# Patient Record
Sex: Female | Born: 1945
Health system: Southern US, Community
[De-identification: ages and names within clinical notes are randomized; demographics above are authoritative.]

## PROBLEM LIST (undated history)

## (undated) DIAGNOSIS — K838 Other specified diseases of biliary tract: Secondary | ICD-10-CM

## (undated) DIAGNOSIS — M7062 Trochanteric bursitis, left hip: Secondary | ICD-10-CM

## (undated) DIAGNOSIS — F341 Dysthymic disorder: Secondary | ICD-10-CM

## (undated) DIAGNOSIS — M67919 Unspecified disorder of synovium and tendon, unspecified shoulder: Secondary | ICD-10-CM

## (undated) DIAGNOSIS — E559 Vitamin D deficiency, unspecified: Secondary | ICD-10-CM

## (undated) DIAGNOSIS — K219 Gastro-esophageal reflux disease without esophagitis: Secondary | ICD-10-CM

## (undated) DIAGNOSIS — D649 Anemia, unspecified: Secondary | ICD-10-CM

## (undated) DIAGNOSIS — G4700 Insomnia, unspecified: Secondary | ICD-10-CM

## (undated) DIAGNOSIS — I8390 Asymptomatic varicose veins of unspecified lower extremity: Secondary | ICD-10-CM

## (undated) DIAGNOSIS — E785 Hyperlipidemia, unspecified: Secondary | ICD-10-CM

## (undated) DIAGNOSIS — I10 Essential (primary) hypertension: Secondary | ICD-10-CM

## (undated) DIAGNOSIS — M719 Bursopathy, unspecified: Secondary | ICD-10-CM

## (undated) DIAGNOSIS — I2510 Atherosclerotic heart disease of native coronary artery without angina pectoris: Secondary | ICD-10-CM

## (undated) DIAGNOSIS — E039 Hypothyroidism, unspecified: Secondary | ICD-10-CM

## (undated) HISTORY — DX: Gastro-esophageal reflux disease without esophagitis: K21.9

## (undated) HISTORY — DX: Asymptomatic varicose veins of unspecified lower extremity: I83.90

## (undated) HISTORY — DX: Vitamin D deficiency, unspecified: E55.9

## (undated) HISTORY — DX: Atherosclerotic heart disease of native coronary artery without angina pectoris: I25.10

## (undated) HISTORY — DX: Other specified diseases of biliary tract: K83.8

## (undated) HISTORY — DX: Bursopathy, unspecified: M71.9

## (undated) HISTORY — DX: Essential (primary) hypertension: I10

## (undated) HISTORY — DX: Trochanteric bursitis, left hip: M70.62

## (undated) HISTORY — DX: Insomnia, unspecified: G47.00

## (undated) HISTORY — PX: OTHER SURGICAL HISTORY: SHX169

## (undated) HISTORY — DX: Unspecified disorder of synovium and tendon, unspecified shoulder: M67.919

## (undated) HISTORY — PX: CHOLECYSTECTOMY: SHX55

## (undated) HISTORY — DX: Anemia, unspecified: D64.9

## (undated) HISTORY — DX: Dysthymic disorder: F34.1

## (undated) HISTORY — DX: Hypothyroidism, unspecified: E03.9

## (undated) HISTORY — DX: Hyperlipidemia, unspecified: E78.5

---

## 1998-01-24 ENCOUNTER — Ambulatory Visit (HOSPITAL_COMMUNITY): Admission: RE | Admit: 1998-01-24 | Discharge: 1998-01-24 | Payer: Self-pay | Admitting: Obstetrics and Gynecology

## 1998-01-24 ENCOUNTER — Encounter: Payer: Self-pay | Admitting: Obstetrics and Gynecology

## 1998-03-14 ENCOUNTER — Ambulatory Visit (HOSPITAL_COMMUNITY): Admission: RE | Admit: 1998-03-14 | Discharge: 1998-03-14 | Payer: Self-pay | Admitting: Gastroenterology

## 1998-05-12 ENCOUNTER — Other Ambulatory Visit: Admission: RE | Admit: 1998-05-12 | Discharge: 1998-05-12 | Payer: Self-pay | Admitting: Obstetrics and Gynecology

## 1999-05-04 ENCOUNTER — Other Ambulatory Visit: Admission: RE | Admit: 1999-05-04 | Discharge: 1999-05-04 | Payer: Self-pay | Admitting: Obstetrics and Gynecology

## 2000-05-06 ENCOUNTER — Ambulatory Visit (HOSPITAL_COMMUNITY): Admission: RE | Admit: 2000-05-06 | Discharge: 2000-05-06 | Payer: Self-pay | Admitting: Gastroenterology

## 2000-05-06 ENCOUNTER — Encounter (INDEPENDENT_AMBULATORY_CARE_PROVIDER_SITE_OTHER): Payer: Self-pay | Admitting: Specialist

## 2000-05-23 ENCOUNTER — Other Ambulatory Visit: Admission: RE | Admit: 2000-05-23 | Discharge: 2000-05-23 | Payer: Self-pay | Admitting: Obstetrics and Gynecology

## 2001-06-19 ENCOUNTER — Other Ambulatory Visit: Admission: RE | Admit: 2001-06-19 | Discharge: 2001-06-19 | Payer: Self-pay | Admitting: Obstetrics and Gynecology

## 2002-04-25 ENCOUNTER — Encounter: Payer: Self-pay | Admitting: Emergency Medicine

## 2002-04-25 ENCOUNTER — Inpatient Hospital Stay (HOSPITAL_COMMUNITY): Admission: EM | Admit: 2002-04-25 | Discharge: 2002-04-26 | Payer: Self-pay | Admitting: Emergency Medicine

## 2002-09-17 ENCOUNTER — Other Ambulatory Visit: Admission: RE | Admit: 2002-09-17 | Discharge: 2002-09-17 | Payer: Self-pay | Admitting: Obstetrics and Gynecology

## 2003-02-27 ENCOUNTER — Emergency Department (HOSPITAL_COMMUNITY): Admission: EM | Admit: 2003-02-27 | Discharge: 2003-02-28 | Payer: Self-pay | Admitting: Emergency Medicine

## 2003-03-01 ENCOUNTER — Encounter (INDEPENDENT_AMBULATORY_CARE_PROVIDER_SITE_OTHER): Payer: Self-pay | Admitting: Specialist

## 2003-03-01 ENCOUNTER — Inpatient Hospital Stay (HOSPITAL_COMMUNITY): Admission: EM | Admit: 2003-03-01 | Discharge: 2003-03-04 | Payer: Self-pay | Admitting: Emergency Medicine

## 2003-09-02 ENCOUNTER — Other Ambulatory Visit: Admission: RE | Admit: 2003-09-02 | Discharge: 2003-09-02 | Payer: Self-pay | Admitting: Obstetrics and Gynecology

## 2003-10-08 ENCOUNTER — Ambulatory Visit (HOSPITAL_COMMUNITY): Admission: RE | Admit: 2003-10-08 | Discharge: 2003-10-08 | Payer: Self-pay | Admitting: Orthopedic Surgery

## 2003-12-11 ENCOUNTER — Ambulatory Visit: Payer: Self-pay | Admitting: Family Medicine

## 2004-08-05 ENCOUNTER — Encounter: Admission: RE | Admit: 2004-08-05 | Discharge: 2004-08-18 | Payer: Self-pay | Admitting: Nurse Practitioner

## 2004-09-10 ENCOUNTER — Ambulatory Visit: Payer: Self-pay | Admitting: Family Medicine

## 2004-10-05 ENCOUNTER — Other Ambulatory Visit: Admission: RE | Admit: 2004-10-05 | Discharge: 2004-10-05 | Payer: Self-pay | Admitting: Obstetrics and Gynecology

## 2004-10-23 ENCOUNTER — Ambulatory Visit (HOSPITAL_COMMUNITY): Admission: RE | Admit: 2004-10-23 | Discharge: 2004-10-23 | Payer: Self-pay | Admitting: Orthopedic Surgery

## 2004-11-24 ENCOUNTER — Ambulatory Visit: Payer: Self-pay | Admitting: Family Medicine

## 2005-06-15 ENCOUNTER — Ambulatory Visit: Payer: Self-pay | Admitting: Family Medicine

## 2005-06-21 ENCOUNTER — Ambulatory Visit: Payer: Self-pay | Admitting: Family Medicine

## 2005-06-25 ENCOUNTER — Ambulatory Visit: Payer: Self-pay | Admitting: Family Medicine

## 2005-07-06 ENCOUNTER — Ambulatory Visit: Payer: Self-pay | Admitting: Family Medicine

## 2005-07-15 ENCOUNTER — Ambulatory Visit: Payer: Self-pay | Admitting: Family Medicine

## 2005-07-23 ENCOUNTER — Ambulatory Visit: Payer: Self-pay | Admitting: Internal Medicine

## 2005-08-02 ENCOUNTER — Ambulatory Visit: Payer: Self-pay | Admitting: Family Medicine

## 2005-10-04 ENCOUNTER — Ambulatory Visit: Payer: Self-pay | Admitting: Internal Medicine

## 2005-11-08 ENCOUNTER — Ambulatory Visit (HOSPITAL_COMMUNITY): Admission: RE | Admit: 2005-11-08 | Discharge: 2005-11-08 | Payer: Self-pay | Admitting: Obstetrics and Gynecology

## 2005-11-24 ENCOUNTER — Encounter: Admission: RE | Admit: 2005-11-24 | Discharge: 2005-11-24 | Payer: Self-pay | Admitting: Obstetrics and Gynecology

## 2005-12-02 ENCOUNTER — Encounter (INDEPENDENT_AMBULATORY_CARE_PROVIDER_SITE_OTHER): Payer: Self-pay | Admitting: *Deleted

## 2005-12-02 ENCOUNTER — Encounter: Admission: RE | Admit: 2005-12-02 | Discharge: 2005-12-02 | Payer: Self-pay | Admitting: Obstetrics and Gynecology

## 2005-12-03 ENCOUNTER — Encounter (INDEPENDENT_AMBULATORY_CARE_PROVIDER_SITE_OTHER): Payer: Self-pay | Admitting: *Deleted

## 2005-12-03 ENCOUNTER — Ambulatory Visit (HOSPITAL_COMMUNITY): Admission: RE | Admit: 2005-12-03 | Discharge: 2005-12-03 | Payer: Self-pay | Admitting: Gastroenterology

## 2005-12-20 ENCOUNTER — Ambulatory Visit: Payer: Self-pay | Admitting: Internal Medicine

## 2006-02-01 ENCOUNTER — Encounter: Payer: Self-pay | Admitting: Family Medicine

## 2006-02-01 LAB — CONVERTED CEMR LAB
AST: 18 units/L (ref 0–37)
Albumin: 4.8 g/dL (ref 3.5–5.2)
Alkaline Phosphatase: 92 units/L (ref 39–117)
Glucose, Bld: 62 mg/dL — ABNORMAL LOW (ref 70–99)
HCT: 46.8 % — ABNORMAL HIGH (ref 36.0–46.0)
Indirect Bilirubin: 0.6 mg/dL (ref 0.0–0.9)
MCV: 93 fL (ref 78.0–100.0)
Platelets: 293 10*3/uL (ref 150–400)
Potassium: 4.7 meq/L (ref 3.5–5.3)
RDW: 15.3 % — ABNORMAL HIGH (ref 11.5–14.0)
Sodium: 142 meq/L (ref 135–145)
TSH: 0.272 microintl units/mL — ABNORMAL LOW (ref 0.350–5.50)
Total Protein: 7.8 g/dL (ref 6.0–8.3)

## 2006-06-01 ENCOUNTER — Encounter: Admission: RE | Admit: 2006-06-01 | Discharge: 2006-06-01 | Payer: Self-pay | Admitting: Obstetrics and Gynecology

## 2006-08-02 ENCOUNTER — Ambulatory Visit: Payer: Self-pay | Admitting: Family Medicine

## 2006-10-17 ENCOUNTER — Telehealth: Payer: Self-pay | Admitting: Family Medicine

## 2006-11-23 ENCOUNTER — Encounter: Payer: Self-pay | Admitting: Family Medicine

## 2006-11-23 ENCOUNTER — Ambulatory Visit (HOSPITAL_COMMUNITY): Admission: RE | Admit: 2006-11-23 | Discharge: 2006-11-23 | Payer: Self-pay | Admitting: Obstetrics and Gynecology

## 2007-01-09 ENCOUNTER — Ambulatory Visit: Payer: Self-pay | Admitting: Internal Medicine

## 2007-01-09 DIAGNOSIS — E039 Hypothyroidism, unspecified: Secondary | ICD-10-CM | POA: Insufficient documentation

## 2007-01-09 DIAGNOSIS — I10 Essential (primary) hypertension: Secondary | ICD-10-CM

## 2007-01-09 DIAGNOSIS — E785 Hyperlipidemia, unspecified: Secondary | ICD-10-CM

## 2007-01-10 ENCOUNTER — Encounter: Payer: Self-pay | Admitting: Internal Medicine

## 2007-01-10 LAB — CONVERTED CEMR LAB
Albumin: 4.7 g/dL (ref 3.5–5.2)
Alkaline Phosphatase: 86 units/L (ref 39–117)
BUN: 18 mg/dL (ref 6–23)
Basophils Absolute: 0 10*3/uL (ref 0.0–0.1)
Chloride: 100 meq/L (ref 96–112)
Cholesterol: 217 mg/dL (ref 0–200)
Creatinine, Ser: 1 mg/dL (ref 0.4–1.2)
HDL: 50.9 mg/dL (ref >39.0)
MCHC: 34.2 g/dL (ref 30.0–36.0)
Monocytes Relative: 8.2 % (ref 3.0–11.0)
Potassium: 3.8 meq/L (ref 3.5–5.1)
RBC: 4.49 M/uL (ref 3.87–5.11)
RDW: 13.8 % (ref 11.5–14.6)
TSH: 56.05 microintl units/mL — ABNORMAL HIGH (ref 0.35–5.50)
Total Bilirubin: 1.3 mg/dL — ABNORMAL HIGH (ref 0.3–1.2)
Total CHOL/HDL Ratio: 4.3
Triglycerides: 131 mg/dL (ref 0–149)

## 2007-02-06 ENCOUNTER — Telehealth: Payer: Self-pay | Admitting: Internal Medicine

## 2007-03-02 ENCOUNTER — Ambulatory Visit: Payer: Self-pay | Admitting: Family Medicine

## 2007-03-02 DIAGNOSIS — F341 Dysthymic disorder: Secondary | ICD-10-CM

## 2007-03-02 DIAGNOSIS — F5101 Primary insomnia: Secondary | ICD-10-CM

## 2007-03-16 ENCOUNTER — Ambulatory Visit: Payer: Self-pay | Admitting: Family Medicine

## 2007-05-21 ENCOUNTER — Emergency Department (HOSPITAL_COMMUNITY): Admission: EM | Admit: 2007-05-21 | Discharge: 2007-05-21 | Payer: Self-pay | Admitting: Emergency Medicine

## 2007-06-02 ENCOUNTER — Ambulatory Visit (HOSPITAL_COMMUNITY): Admission: RE | Admit: 2007-06-02 | Discharge: 2007-06-02 | Payer: Self-pay | Admitting: Orthopedic Surgery

## 2007-06-21 DIAGNOSIS — R071 Chest pain on breathing: Secondary | ICD-10-CM

## 2007-06-30 ENCOUNTER — Ambulatory Visit: Payer: Self-pay | Admitting: Family Medicine

## 2007-09-07 ENCOUNTER — Ambulatory Visit: Payer: Self-pay | Admitting: Family Medicine

## 2007-09-07 DIAGNOSIS — I2510 Atherosclerotic heart disease of native coronary artery without angina pectoris: Secondary | ICD-10-CM | POA: Insufficient documentation

## 2007-09-14 ENCOUNTER — Encounter: Payer: Self-pay | Admitting: Family Medicine

## 2007-09-18 ENCOUNTER — Ambulatory Visit (HOSPITAL_COMMUNITY): Admission: RE | Admit: 2007-09-18 | Discharge: 2007-09-18 | Payer: Self-pay | Admitting: Interventional Cardiology

## 2007-09-18 ENCOUNTER — Encounter: Payer: Self-pay | Admitting: Family Medicine

## 2007-10-03 ENCOUNTER — Telehealth (INDEPENDENT_AMBULATORY_CARE_PROVIDER_SITE_OTHER): Payer: Self-pay | Admitting: *Deleted

## 2007-10-04 ENCOUNTER — Encounter: Payer: Self-pay | Admitting: Family Medicine

## 2007-10-11 ENCOUNTER — Telehealth: Payer: Self-pay | Admitting: Family Medicine

## 2007-10-23 ENCOUNTER — Encounter: Payer: Self-pay | Admitting: Family Medicine

## 2007-10-23 ENCOUNTER — Encounter: Admission: RE | Admit: 2007-10-23 | Discharge: 2007-11-20 | Payer: Self-pay | Admitting: Family Medicine

## 2007-11-17 ENCOUNTER — Ambulatory Visit (HOSPITAL_COMMUNITY): Admission: RE | Admit: 2007-11-17 | Discharge: 2007-11-17 | Payer: Self-pay | Admitting: Orthopedic Surgery

## 2007-11-27 ENCOUNTER — Ambulatory Visit (HOSPITAL_COMMUNITY): Admission: RE | Admit: 2007-11-27 | Discharge: 2007-11-27 | Payer: Self-pay | Admitting: Family Medicine

## 2007-11-27 ENCOUNTER — Encounter: Payer: Self-pay | Admitting: Family Medicine

## 2007-12-06 ENCOUNTER — Telehealth: Payer: Self-pay | Admitting: Family Medicine

## 2008-01-10 ENCOUNTER — Ambulatory Visit: Payer: Self-pay | Admitting: Family Medicine

## 2008-01-10 DIAGNOSIS — M67919 Unspecified disorder of synovium and tendon, unspecified shoulder: Secondary | ICD-10-CM | POA: Insufficient documentation

## 2008-01-10 DIAGNOSIS — M719 Bursopathy, unspecified: Secondary | ICD-10-CM

## 2008-01-17 ENCOUNTER — Ambulatory Visit (HOSPITAL_COMMUNITY): Admission: RE | Admit: 2008-01-17 | Discharge: 2008-01-18 | Payer: Self-pay | Admitting: Orthopedic Surgery

## 2008-02-26 ENCOUNTER — Encounter: Admission: RE | Admit: 2008-02-26 | Discharge: 2008-04-15 | Payer: Self-pay | Admitting: Orthopedic Surgery

## 2008-04-24 ENCOUNTER — Telehealth: Payer: Self-pay | Admitting: Family Medicine

## 2008-05-08 ENCOUNTER — Ambulatory Visit: Payer: Self-pay | Admitting: Family Medicine

## 2008-05-08 DIAGNOSIS — H8309 Labyrinthitis, unspecified ear: Secondary | ICD-10-CM | POA: Insufficient documentation

## 2008-05-09 LAB — CONVERTED CEMR LAB: TSH: 97.04 microintl units/mL — ABNORMAL HIGH (ref 0.35–5.50)

## 2008-06-10 ENCOUNTER — Ambulatory Visit: Payer: Self-pay | Admitting: Family Medicine

## 2008-06-12 ENCOUNTER — Telehealth: Payer: Self-pay | Admitting: Family Medicine

## 2008-06-21 LAB — CONVERTED CEMR LAB
T3, Free: 3 pg/mL (ref 2.3–4.2)
T4, Total: 10.7 ug/dL (ref 5.0–12.5)
TSH: 0.13 microintl units/mL — ABNORMAL LOW (ref 0.35–5.50)

## 2008-07-30 LAB — HM DEXA SCAN

## 2008-10-31 ENCOUNTER — Telehealth: Payer: Self-pay | Admitting: Family Medicine

## 2008-11-07 ENCOUNTER — Ambulatory Visit: Payer: Self-pay | Admitting: Family Medicine

## 2008-11-07 DIAGNOSIS — R252 Cramp and spasm: Secondary | ICD-10-CM

## 2008-11-07 DIAGNOSIS — H6690 Otitis media, unspecified, unspecified ear: Secondary | ICD-10-CM | POA: Insufficient documentation

## 2008-11-07 DIAGNOSIS — E876 Hypokalemia: Secondary | ICD-10-CM | POA: Insufficient documentation

## 2008-11-08 LAB — CONVERTED CEMR LAB
AST: 22 units/L (ref 0–37)
Albumin: 4.2 g/dL (ref 3.5–5.2)
Alkaline Phosphatase: 83 units/L (ref 39–117)
Cholesterol: 203 mg/dL — ABNORMAL HIGH (ref 0–200)
Direct LDL: 112.6 mg/dL
TSH: 4.84 microintl units/mL (ref 0.35–5.50)
Total CHOL/HDL Ratio: 4
Total Protein: 6.8 g/dL (ref 6.0–8.3)
Triglycerides: 201 mg/dL — ABNORMAL HIGH (ref 0.0–149.0)
VLDL: 40.2 mg/dL — ABNORMAL HIGH (ref 0.0–40.0)

## 2008-11-21 ENCOUNTER — Telehealth: Payer: Self-pay | Admitting: Family Medicine

## 2008-11-26 ENCOUNTER — Telehealth: Payer: Self-pay | Admitting: Family Medicine

## 2008-11-28 ENCOUNTER — Ambulatory Visit (HOSPITAL_COMMUNITY): Admission: RE | Admit: 2008-11-28 | Discharge: 2008-11-28 | Payer: Self-pay | Admitting: Family Medicine

## 2008-11-28 LAB — HM MAMMOGRAPHY

## 2008-12-30 ENCOUNTER — Ambulatory Visit (HOSPITAL_COMMUNITY): Admission: RE | Admit: 2008-12-30 | Discharge: 2008-12-30 | Payer: Self-pay | Admitting: Gastroenterology

## 2009-03-27 ENCOUNTER — Ambulatory Visit: Payer: Self-pay | Admitting: Family Medicine

## 2009-03-27 DIAGNOSIS — K219 Gastro-esophageal reflux disease without esophagitis: Secondary | ICD-10-CM

## 2009-03-27 DIAGNOSIS — E559 Vitamin D deficiency, unspecified: Secondary | ICD-10-CM | POA: Insufficient documentation

## 2009-03-27 DIAGNOSIS — I8390 Asymptomatic varicose veins of unspecified lower extremity: Secondary | ICD-10-CM | POA: Insufficient documentation

## 2009-04-02 LAB — CONVERTED CEMR LAB
BUN: 18 mg/dL (ref 6–23)
Chloride: 105 meq/L (ref 96–112)
Creatinine, Ser: 0.7 mg/dL (ref 0.4–1.2)
GFR calc non Af Amer: 89.23 mL/min (ref >60)
TSH: 14.31 microintl units/mL — ABNORMAL HIGH (ref 0.35–5.50)
Vit D, 25-Hydroxy: 27 ng/mL — ABNORMAL LOW (ref 30–89)

## 2009-04-14 ENCOUNTER — Ambulatory Visit (HOSPITAL_COMMUNITY): Admission: RE | Admit: 2009-04-14 | Discharge: 2009-04-14 | Payer: Self-pay | Admitting: Obstetrics and Gynecology

## 2009-05-26 ENCOUNTER — Ambulatory Visit: Payer: Self-pay | Admitting: Family Medicine

## 2009-06-10 ENCOUNTER — Telehealth: Payer: Self-pay | Admitting: Family Medicine

## 2009-08-06 ENCOUNTER — Ambulatory Visit: Payer: Self-pay | Admitting: Family Medicine

## 2009-08-25 ENCOUNTER — Telehealth: Payer: Self-pay | Admitting: Family Medicine

## 2009-11-26 ENCOUNTER — Telehealth (INDEPENDENT_AMBULATORY_CARE_PROVIDER_SITE_OTHER): Payer: Self-pay | Admitting: *Deleted

## 2009-12-01 ENCOUNTER — Telehealth: Payer: Self-pay | Admitting: Family Medicine

## 2009-12-18 ENCOUNTER — Ambulatory Visit (HOSPITAL_COMMUNITY)
Admission: RE | Admit: 2009-12-18 | Discharge: 2009-12-18 | Payer: Self-pay | Source: Home / Self Care | Admitting: Obstetrics and Gynecology

## 2010-02-05 ENCOUNTER — Ambulatory Visit
Admission: RE | Admit: 2010-02-05 | Discharge: 2010-02-05 | Payer: Self-pay | Source: Home / Self Care | Attending: Family Medicine | Admitting: Family Medicine

## 2010-02-05 ENCOUNTER — Other Ambulatory Visit: Payer: Self-pay | Admitting: Family Medicine

## 2010-02-05 LAB — TSH: TSH: 0.04 u[IU]/mL — ABNORMAL LOW (ref 0.35–5.50)

## 2010-02-07 ENCOUNTER — Encounter: Payer: Self-pay | Admitting: Orthopedic Surgery

## 2010-02-08 ENCOUNTER — Encounter: Payer: Self-pay | Admitting: Orthopedic Surgery

## 2010-02-08 ENCOUNTER — Encounter: Payer: Self-pay | Admitting: Otolaryngology

## 2010-02-08 ENCOUNTER — Encounter: Payer: Self-pay | Admitting: Obstetrics and Gynecology

## 2010-02-15 LAB — CONVERTED CEMR LAB
Alkaline Phosphatase: 102 units/L (ref 39–117)
Basophils Absolute: 0 10*3/uL (ref 0.0–0.1)
Bilirubin, Direct: 0.1 mg/dL (ref 0.0–0.3)
Calcium: 9.9 mg/dL (ref 8.4–10.5)
Cholesterol: 144 mg/dL (ref 0–200)
Eosinophils Absolute: 0.1 10*3/uL (ref 0.0–0.7)
GFR calc Af Amer: 109 mL/min
GFR calc non Af Amer: 90 mL/min
HCT: 40.9 % (ref 36.0–46.0)
HDL: 41 mg/dL (ref >39.0)
MCHC: 34.5 g/dL (ref 30.0–36.0)
MCV: 90 fL (ref 78.0–100.0)
Monocytes Absolute: 0.4 10*3/uL (ref 0.1–1.0)
Monocytes Relative: 8.6 % (ref 3.0–12.0)
Platelets: 249 10*3/uL (ref 150–400)
Potassium: 3.6 meq/L (ref 3.5–5.1)
RDW: 13.1 % (ref 11.5–14.6)
Sodium: 141 meq/L (ref 135–145)
Total Bilirubin: 0.9 mg/dL (ref 0.3–1.2)
Total CHOL/HDL Ratio: 3.5
Triglycerides: 131 mg/dL (ref 0–149)
Vit D, 1,25-Dihydroxy: 39 (ref 30–89)

## 2010-02-17 NOTE — Progress Notes (Signed)
Summary: URI  Phone Note Call from Patient   Caller: Patient Call For: Judithann Sheen MD Summary of Call: Pt is complaining of URI infection with green mucus, and cough x 3 days.  Would like Rx sent to  [facility])  No fever. [phone] Initial call taken by: Lynann Beaver CMA,  August 25, 2009 3:44 PM  Follow-up for Phone Call        call in a [employer] Follow-up by: Nelwyn Salisbury MD,  August 25, 2009 4:37 PM  Additional Follow-up for Phone Call Additional follow up Details #1::        Rx Called In Additional Follow-up by: Raechel Ache, RN,  August 25, 2009 4:42 PM

## 2010-02-17 NOTE — Assessment & Plan Note (Signed)
Summary: follow up/pt req to come in fasting/cjr   Vital Signs:  Patient profile:   65 year old female Weight:      166.5 pounds O2 Sat:      97 % Temp:     98.5 degrees F Pulse rate:   74 / minute Pulse rhythm:   regular BP sitting:   124 / 82  (left arm)  Vitals Entered By: Pura Spice, RN (August 06, 2009 11:15 AM) CC: will be retiring in august 2011 and wants written rx to mail off.    History of Present Illness: To 65 year old white married female is in today requesting prescriptions written to send for mail order since he retired in August Physical patient states she is doing well no cardiac problems see his cardiologist on regular basis up-to-date on her lab work regarding hypothyroidism GERD is under control with Nexium Continues to have shoulder pain with rotator cuff syndrome stated the past week had low-grade fever with no other symptoms which is now improved  Allergies (verified): No Known Drug Allergies  Past History:  Past Medical History: Last updated: 01/09/2007 Hyperlipidemia Hypertension Hypothyroidism  Risk Factors: Smoking Status: never (09/07/2007) Passive Smoke Exposure: no (09/07/2007)  Review of Systems      See HPI  The patient denies anorexia, fever, weight loss, weight gain, vision loss, decreased hearing, hoarseness, chest pain, syncope, dyspnea on exertion, peripheral edema, prolonged cough, headaches, hemoptysis, abdominal pain, melena, hematochezia, severe indigestion/heartburn, hematuria, incontinence, genital sores, muscle weakness, suspicious skin lesions, transient blindness, difficulty walking, depression, unusual weight change, abnormal bleeding, enlarged lymph nodes, angioedema, breast masses, and testicular masses.    Physical Exam  General:  Well-developed,well-nourished,in no acute distress; alert,appropriate and cooperative throughout examination Head:  Normocephalic and atraumatic without obvious abnormalities. No apparent  alopecia or balding. Eyes:  No corneal or conjunctival inflammation noted. EOMI. Perrla. Funduscopic exam benign, without hemorrhages, exudates or papilledema. Vision grossly normal. Ears:  External ear exam shows no significant lesions or deformities.  Otoscopic examination reveals clear canals, tympanic membranes are intact bilaterally without bulging, retraction, inflammation or discharge. Hearing is grossly normal bilaterally. Nose:  External nasal examination shows no deformity or inflammation. Nasal mucosa are pink and moist without lesions or exudates. Mouth:  Oral mucosa and oropharynx without lesions or exudates.  Teeth in good repair. Neck:  No deformities, masses, or tenderness noted. Lungs:  Normal respiratory effort, chest expands symmetrically. Lungs are clear to auscultation, no crackles or wheezes. Heart:  Normal rate and regular rhythm. S1 and S2 normal without gallop, murmur, click, rub or other extra sounds. Abdomen:  Bowel sounds positive,abdomen soft and non-tender without masses, organomegaly or hernias noted. Msk:  since surgery liver masses have greatly diminished and improved Extremities:  no edema   Impression & Recommendations:  Problem # 1:  FEVER (ICD-780.60) Assessment Improved had unexplained fever low-grade unable to find any explanation  Problem # 2:  GERD (ICD-530.81) Assessment: Improved  Her updated medication list for this problem includes:    Nexium 40 Mg Cpdr (Esomeprazole magnesium) .Marland Kitchen... 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 One by mouth daily    Aspirin Adult Low Strength 81 Mg Tbec (Aspirin) .Marland KitchenMarland KitchenMarland KitchenMarland Kitchen  1 qd    Propranolol  Hcl 20 Mg Tabs (Propranolol hcl) .Marland Kitchen... 1 two times a day for heart  Problem # 6:  HYPERLIPIDEMIA (ICD-272.4) Assessment: Improved  Her updated medication list for this problem includes:    Lovastatin 40 Mg Tabs (Lovastatin) .Marland Kitchen... 1 hs    Colestipol Hcl 1 Gm Tabs (Colestipol hcl) .Marland Kitchen... 1 qd  Problem # 7:  HYPOTHYROIDISM (ICD-244.9) Assessment: Improved  Her updated medication list for this problem includes:    Synthroid 200 Mcg Tabs (Levothyroxine sodium) .Marland Kitchen... 1 qd  Problem # 8:  HYPERTENSION (ICD-401.9) Assessment: Improved  Her updated medication list for this problem includes:    Benazepril Hcl 20 Mg Tabs (Benazepril hcl) ..... One by mouth daily    Propranolol Hcl 20 Mg Tabs (Propranolol hcl) .Marland Kitchen... 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 One by mouth daily 2)  Lovastatin 40 Mg Tabs (Lovastatin) .Marland Kitchen.. 1 hs 3)  Aspirin Adult Low Strength 81 Mg Tbec (Aspirin) .Marland Kitchen.. 1 qd 4)  Propranolol Hcl 20 Mg Tabs (Propranolol hcl) .Marland Kitchen.. 1 two times a day for heart 5)  Synthroid 200 Mcg Tabs (Levothyroxine sodium) .Marland Kitchen.. 1 qd 6)  Hydrocodone-acetaminophen 10-660 Mg Tabs (Hydrocodone-acetaminophen) .Marland Kitchen.. 1 q4h as needed pain, not over 4 per day 7)  Nexium 40 Mg Cpdr (Esomeprazole magnesium) .Marland Kitchen.. 1 by mouth once daily 8)  Multi-day Tabs (Multiple vitamin) 9)  Calcium 1200 1200-1000 Mg-unit Chew (Calcium carbonate-vit d-min) 10)  Colestipol Hcl 1 Gm Tabs (Colestipol hcl) .Marland Kitchen.. 1 qd 11)  Vitamin D (ergocalciferol) 50000 Unit Caps (Ergocalciferol) .Marland KitchenMarland KitchenMarland Kitchen 1 weekly x 12 weeks  Patient Instructions: 1)  physical examination reveals no abnormality nor explanation for her fever 2)  Thank you doing fine for her medical problems and I will refill her medications Prescriptions: HYDROCODONE-ACETAMINOPHEN 10-660 MG TABS (HYDROCODONE-ACETAMINOPHEN) 1 q4h as needed pain, not over 4 per  day  #120 x 5   Entered and Authorized by:   Judithann Sheen MD   Signed by:   Judithann Sheen MD on 08/06/2009   Method used:   Print then Give to Patient   RxID:   [id number] COLESTIPOL HCL 1 GM TABS (COLESTIPOL HCL) 1 qd  #90 x 3   Entered and Authorized by:   Judithann Sheen MD   Signed by:   Judithann Sheen MD on 08/06/2009   Method used:   Print then Give to Patient   RxID:   [id number] NEXIUM 40 MG CPDR (ESOMEPRAZOLE MAGNESIUM) 1 by mouth once daily  #90 x 3   Entered and Authorized by:   Judithann Sheen MD   Signed by:   Judithann Sheen MD on 08/06/2009   Method used:   Print then Give to Patient   RxID:   [id number] SYNTHROID 200 MCG TABS (LEVOTHYROXINE SODIUM) 1 qd  #90 x 3   Entered and Authorized by:   Judithann Sheen MD   Signed by:   Judithann Sheen MD on 08/06/2009   Method used:   Print then Give to Patient   RxID:   [id number] PROPRANOLOL HCL 20 MG TABS (PROPRANOLOL HCL) 1 two times a day for heart  #180 x 3   Entered and Authorized by:   Judithann Sheen MD   Signed by:   Ivar Drape  Danice Goltz MD on 08/06/2009   Method used:   Print then Give to Patient   RxID:   [id number] LOVASTATIN 40 MG  TABS (LOVASTATIN) 1 hs  #90 x 3   Entered and Authorized by:   Judithann Sheen MD   Signed by:   Judithann Sheen MD on 08/06/2009   Method used:   Print then Give to Patient   RxID:   [id number] BENAZEPRIL HCL 20 MG  TABS (BENAZEPRIL HCL) one by mouth daily  #90 x 3   Entered and Authorized by:   Judithann Sheen MD   Signed by:   Judithann Sheen MD on 08/06/2009   Method used:   Print then Give to Patient   RxID:   [id number]

## 2010-02-17 NOTE — Assessment & Plan Note (Signed)
Summary: emp/pt coming in fasting/cjr   Vital Signs:  Patient profile:   65 year old female Height:      62 inches Weight:      165 pounds BMI:     30.29 O2 Sat:      98 % Temp:     97.8 degrees F Pulse rate:   77 / minute BP sitting:   140 / 94  (left arm)  Vitals Entered By: Pura Spice, RN (March 27, 2009 8:50 AM) CC: cramps rt leg  refill hydrocodone and wants labs drawn    History of Present Illness: this 65 year old white married female who just turned 43 on January 28 is in for a physical examination and evaluation of her problems her medication refills. She came in fasting for lab studies she did have some lab evaluations of October 2000 and she has coronary artery disease that is under good control him occasional has costochondral chest pain which is of no real problems. One per minute complaint is she has occasional muscle cramps in the right leg and this is severe enough time that she needs some analgesic such as hydrocodone said a bone density this year as well as a mammogram no abnormalities noted here that a colonoscopic exam done in 2002 by Dr. Sharol Roussel, to be repeated in 5 years. Gynecologist is Dr. Esperanza Richters She had her veins in her right leg and is on the care of Dr. Doreene Adas is thought that this is what caused her pain and this addendum for some time and now considering surgery Needs refills of her medication  Allergies (verified): No Known Drug Allergies  Past History:  Past Medical History: Last updated: 01/09/2007 Hyperlipidemia Hypertension Hypothyroidism  Risk Factors: Smoking Status: never (09/07/2007) Passive Smoke Exposure: no (09/07/2007)  Review of Systems  The patient denies anorexia, fever, weight loss, weight gain, vision loss, decreased hearing, hoarseness, chest pain, syncope, dyspnea on exertion, peripheral edema, prolonged cough, headaches, hemoptysis, abdominal pain, melena, hematochezia, severe indigestion/heartburn, hematuria, incontinence,  genital sores, muscle weakness, suspicious skin lesions, transient blindness, difficulty walking, depression, unusual weight change, abnormal bleeding, enlarged lymph nodes, angioedema, breast masses, and testicular masses.    Physical Exam  General:  Well-developed,well-nourished,in no acute distress; alert,appropriate and cooperative throughout examination Head:  Normocephalic and atraumatic without obvious abnormalities. No apparent alopecia or balding. Eyes:  No corneal or conjunctival inflammation noted. EOMI. Perrla. Funduscopic exam benign, without hemorrhages, exudates or papilledema. Vision grossly normal. Ears:  External ear exam shows no significant lesions or deformities.  Otoscopic examination reveals clear canals, tympanic membranes are intact bilaterally without bulging, retraction, inflammation or discharge. Hearing is grossly normal bilaterally. Nose:  External nasal examination shows no deformity or inflammation. Nasal mucosa are pink and moist without lesions or exudates. Mouth:  Oral mucosa and oropharynx without lesions or exudates.  Teeth in good repair. Neck:  No deformities, masses, or tenderness noted. Chest Wall:  No deformities, masses, or tenderness noted. Breasts:  No mass, nodules, thickening, tenderness, bulging, retraction, inflamation, nipple discharge or skin changes noted.   Lungs:  Normal respiratory effort, chest expands symmetrically. Lungs are clear to auscultation, no crackles or wheezes. Heart:  Normal rate and regular rhythm. S1 and S2 normal without gallop, murmur, click, rub or other extra sounds. Abdomen:  Bowel sounds positive,abdomen soft and non-tender without masses, organomegaly or hernias noted. Rectal:  gynecologist Genitalia:  Because the Msk:  limitation of movement of right arm but does have some problem with posterior movement new  to her titer cuff syndrome no other physical abnormalities currently obvious pericarditis is the right lower leg  no tenderness Pulses:  R and L carotid,radial,femoral,dorsalis pedis and posterior tibial pulses are full and equal bilaterally Extremities:  No clubbing, cyanosis, edema, or deformity noted with normal full range of motion of all joints.   Neurologic:  No cranial nerve deficits noted. Station and gait are normal. Plantar reflexes are down-going bilaterally. DTRs are symmetrical throughout. Sensory, motor and coordinative functions appear intact. Skin:  Intact without suspicious lesions or rashes Cervical Nodes:  No lymphadenopathy noted Axillary Nodes:  No palpable lymphadenopathy Inguinal Nodes:  No significant adenopathy Psych:  Cognition and judgment appear intact. Alert and cooperative with normal attention span and concentration. No apparent delusions, illusions, hallucinations   Impression & Recommendations:  Problem # 1:  HYPERTENSION (ICD-401.9) Assessment Deteriorated  Her updated medication list for this problem includes:    Benazepril Hcl 20 Mg Tabs (Benazepril hcl) ..... One by mouth daily    Propranolol Hcl 20 Mg Tabs (Propranolol hcl) .Marland Kitchen... 1 two times a day for heart Has not been taking benazepril, to restart  Problem # 2:  LEG CRAMPS (ICD-729.82) Assessment: Deteriorated  Problem # 3:  HYPOTHYROIDISM (ICD-244.9) Assessment: Unchanged  Her updated medication list for this problem includes:    Synthroid 200 Mcg Tabs (Levothyroxine sodium) .Marland Kitchen... 1 qd    Synthroid 25 Mcg Tabs (Levothyroxine sodium) .Marland Kitchen... 1 in addition to tab  Orders: TLB-TSH (Thyroid Stimulating Hormone) [id number]) Prescription Created Electronically [id number])  Problem # 4:  HYPERLIPIDEMIA (ICD-272.4) Assessment: Unchanged  Her updated medication list for this problem includes:    Lovastatin 40 Mg Tabs (Lovastatin) .Marland Kitchen... 1 hs    Colestipol Hcl 1 Gm Tabs (Colestipol hcl) .Marland Kitchen... 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 1 by mouth once daily  Complete Medication List: 1)  Benazepril Hcl 20 Mg Tabs (Benazepril hcl) .... One by mouth daily 2)  Lovastatin 40 Mg Tabs (Lovastatin) .Marland Kitchen.. 1 hs 3)  Aspirin Adult Low Strength 81 Mg Tbec (Aspirin) .Marland Kitchen.. 1 qd 4)  Propranolol Hcl 20 Mg Tabs (Propranolol hcl) .Marland Kitchen.. 1 two times a day for heart 5)  Synthroid 200 Mcg Tabs (Levothyroxine sodium) .Marland Kitchen.. 1 qd 6)  Hydrocodone-acetaminophen 10-660 Mg Tabs (Hydrocodone-acetaminophen) .Marland Kitchen.. 1 q4h as needed nain 7)  Nexium 40 Mg Cpdr (Esomeprazole magnesium) .Marland Kitchen.. 1 by mouth once daily 8)  Multi-day Tabs (Multiple vitamin) 9)  Calcium 1200 1200-1000 Mg-unit Chew (Calcium carbonate-vit d-min) 10)  Colestipol Hcl 1 Gm Tabs (Colestipol hcl) .... Dr Doneta Public 11)  Vitamin D (ergocalciferol) 50000 Unit Caps (Ergocalciferol) .Marland KitchenMarland KitchenMarland Kitchen 1 weekly x 12 weeks 12)  Synthroid 25 Mcg Tabs (Levothyroxine sodium) .Marland Kitchen.. 1 in addition to tab  Other Orders: Venipuncture (62376) T-Vitamin D (25-Hydroxy) ([id number]) TLB-BMP (Basic Metabolic Panel-BMET) (80048-METABOL)  Patient Instructions: 1)  Nocturnal leg cramps rt leg, relieved with biofreeze but alot of troublecomminuted hydrocodone at times 2)  Blood pressure elevated 3)  Restart beneazepril, refilled 4)  please and GERD as per laid better with Nexium Prescriptions: SYNTHROID 25 MCG TABS (LEVOTHYROXINE SODIUM) 1 in addition to tab  #30 x 11  Entered and Authorized by:   Judithann Sheen MD   Signed by:   Judithann Sheen MD on 04/02/2009   Method used:   Electronically to        [facility]* (retail)       [address]       [address] [place] [employer]       [place], [place]   [address]       Ph: [phone]       Fax: [phone]   RxID:   [id number] VITAMIN D (ERGOCALCIFEROL) 50000 UNIT CAPS (ERGOCALCIFEROL) 1 weekly x 12 weeks  #12 x 1   Entered and Authorized by:   Judithann Sheen MD   Signed by:   Judithann Sheen MD on 04/02/2009   Method used:   Electronically to        [facility]* (retail)       [address]       [address] [employer]       [place], [place]  [address]       Ph: [phone]       Fax: [phone]   RxID:   [id number] BENAZEPRIL HCL 20 MG  TABS (BENAZEPRIL HCL) one by mouth daily  #90 x 3   Entered and Authorized by:   Judithann Sheen MD   Signed by:   Judithann Sheen MD on 03/27/2009   Method used:   Electronically to        [facility]* (retail)       [address]       [address] [employer]       [place], [place]  [address]       Ph: [phone]       Fax: [phone]   RxID:   [id number]

## 2010-02-17 NOTE — Progress Notes (Signed)
Summary: Pt req generic for Nexium or cheaper alternative  Phone Note Call from Patient Call back at Home Phone [phone]   Caller: Patient Summary of Call: Pt needs a generic for Nexium, because Medicare will not cover. Pls call in cheaper alternative to [facility] asap.  Initial call taken by: Lucy Antigua,  December 01, 2009 9:01 AM  Follow-up for Phone Call        Can try Omeprazole 40 mg by mouth once daily but I am not sure if insurance will cover this either, if they will it should be cheaper, # 30, 1 rf Follow-up by: Danise Edge MD,  December 01, 2009 2:10 PM    New/Updated Medications: OMEPRAZOLE 40 MG CPDR (OMEPRAZOLE) once daily Prescriptions: OMEPRAZOLE 40 MG CPDR (OMEPRAZOLE) once daily  #30 x 1   Entered by:   Josph Macho RMA   Authorized by:   Danise Edge MD   Signed by:   Josph Macho RMA on 12/01/2009   Method used:   Electronically to        [facility] [phone]* (retail)       [address]       [place], [place]  [address]       Ph: [phone]       Fax: [phone]   RxID:   [id number] OMEPRAZOLE 40 MG CPDR (OMEPRAZOLE) once daily  #30 x 1   Entered by:   Josph Macho RMA   Authorized by:   Danise Edge MD   Signed by:   Josph Macho RMA on 12/01/2009   Method used:   Electronically to        [facility] [address]* (retail)       [address]       [place], [place]  [address]       Ph: [phone]       Fax: [phone]   RxID:   [id number]  Cancelled Omeprazole at [facility]  Appended Document: Pt req generic for Nexium or cheaper alternative Patient informed.

## 2010-02-17 NOTE — Progress Notes (Signed)
Summary: MED REFILL  Phone Note Call from Patient Call back at Home Phone [phone]   Caller: Patient Call For: Judithann Sheen MD Summary of Call: PT NEEDS REFILL HYDROCODONE 10-660 Brand [facility] [place]. PT STATED DR Clent Ridges AUTHORIZED ONLY 60 PILLS. Initial call taken by: Heron Sabins,  Jun 10, 2009 12:09 PM  Follow-up for Phone Call        ok to give # 60 from last refill that was given by dr fry and 4 additional refills per dr Scotty Court.   done  Follow-up by: Pura Spice, RN,  Jun 10, 2009 12:31 PM    Prescriptions: HYDROCODONE-ACETAMINOPHEN 10-660 MG TABS (HYDROCODONE-ACETAMINOPHEN) 1 q4h as needed pain  #120 x 4   Entered by:   Pura Spice, RN   Authorized by:   Judithann Sheen MD   Signed by:   Pura Spice, RN on 06/10/2009   Method used:   Telephoned to ...       [facility] [address] [phone]* (retail)       [address]       [place], [place]  [address]       Ph: [phone]       Fax: [phone]   RxID:   [id number]

## 2010-02-17 NOTE — Progress Notes (Signed)
Summary: FYI  Phone Note Call from Patient   Summary of Call: Pt would like all RX's sent to [facility] on [address]. Initial call taken by: Josph Macho RMA,  November 26, 2009 9:14 AM

## 2010-02-19 NOTE — Assessment & Plan Note (Signed)
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 Also complaining of slight pain and stiffness in the abdomen and general malaise for pain it is time to recheck her TSH is minimal chronic thyroid treatment in the past year has been changing somewhat blood pressure didn't control her her cardiologist Dr. Carlos Levering relates that she is doing well enough to go see her once per year As stated above her regard her pain she continues to have pain sufficient enough to need hydrocodone episodically  Preventive Screening-Counseling & Management  Alcohol-Tobacco     Smoking Status: never     Passive Smoke Exposure: no  Caffeine-Diet-Exercise     Does Patient Exercise: yes     Type of exercise: walking  Current Medications (verified): 1)  Benazepril Hcl 20 Mg  Tabs (Benazepril Hcl) .... One By Mouth Daily 2)  Lovastatin 40 Mg  Tabs (Lovastatin) .Marland Kitchen.. 1 Hs 3)  Aspirin Adult Low Strength 81 Mg Tbec (Aspirin) .Marland Kitchen.. 1 Qd 4)  Propranolol Hcl 20 Mg Tabs (Propranolol Hcl) .Marland Kitchen.. 1 Two Times A Day For Heart 5)  Synthroid 200 Mcg Tabs (Levothyroxine Sodium) .Marland Kitchen.. 1 Qd 6)  Hydrocodone-Acetaminophen 10-660 Mg Tabs (Hydrocodone-Acetaminophen) .Marland Kitchen.. 1 Q4h As Needed Pain, Not Over 4 Per Day 7)  Omeprazole 40 Mg Cpdr (Omeprazole) .... Once Daily 8)  Multi-Day  Tabs (Multiple Vitamin) 9)  Oscal 500/200 D-3 500-200 Mg-Unit Tabs (Calcium Carbonate-Vitamin D) 10)  Colestipol Hcl 1 Gm Tabs  (Colestipol Hcl) .Marland Kitchen.. 1 Qd 11)  Vitamin D 400 Unit Tabs (Cholecalciferol) 12)  Co Q-10 150 Mg Caps (Coenzyme Q10)  Allergies (verified): No Known Drug Allergies  Past History:  Past Medical History: Last updated: 01/09/2007 Hyperlipidemia Hypertension Hypothyroidism  Risk Factors: Exercise: yes (02/05/2010)  Risk Factors: Smoking Status: never (02/05/2010) Passive Smoke Exposure: no (02/05/2010)  Review of Systems      See HPI Eyes:  Denies blurring, discharge, double vision, eye irritation, eye pain, halos, itching, light sensitivity, red eye, vision loss-1 eye, and vision loss-both eyes. ENT:  Denies decreased hearing, difficulty swallowing, ear discharge, earache, hoarseness, nasal congestion, nosebleeds, postnasal drainage, ringing in ears, sinus pressure, and sore throat. CV:  See HPI. Resp:  Denies chest discomfort, chest pain with inspiration, cough, coughing up blood, excessive snoring, hypersomnolence, morning headaches, pleuritic, shortness of breath, sputum productive, and wheezing. GI:  Complains of indigestion. MS:  Complains of joint pain.  Physical Exam  General:  Well-developed,well-nourished,in no acute distress; alert,appropriate and cooperative throughout examination Neck:  No deformities, masses, or tenderness noted. Chest Wall:  No deformities, masses, or tenderness noted. Breasts:  No mass, nodules, thickening, tenderness, bulging, retraction, inflamation, nipple discharge or skin changes noted.   Lungs:  Normal respiratory effort, chest expands symmetrically. Lungs are clear to auscultation,  no crackles or wheezes. Heart:  Normal rate and regular rhythm. S1 and S2 normal without gallop, murmur, click, rub or other extra sounds. Abdomen:  Bowel sounds positive,abdomen soft and non-tender without masses, organomegaly or hernias noted. Msk:  No deformity or scoliosis noted of thoracic or lumbar spine.   Extremities:  No clubbing, cyanosis, edema, or  deformity noted with normal full range of motion of all joints.     Impression & Recommendations:  Problem # 1:  GERD (ICD-530.81) Assessment Improved  Her updated medication list for this problem includes:    Omeprazole 40 Mg Cpdr (Omeprazole) ..... Once daily  Problem # 2:  VARICOSE VEINS, LOWER EXTREMITIES (ICD-454.9) Assessment: Improved  Problem # 3:  ROTATOR CUFF SYNDROME, RIGHT (ICD-726.10) Assessment: Unchanged  Problem # 4:  CORONARY ARTERY DISEASE (ICD-414.00) Assessment: Improved  Her updated medication list for this problem includes:    Benazepril Hcl 20 Mg Tabs (Benazepril hcl) ..... One by mouth daily    Aspirin Adult Low Strength 81 Mg Tbec (Aspirin) .Marland Kitchen... 1 qd    Propranolol Hcl 20 Mg Tabs (Propranolol hcl) .Marland Kitchen... 1 two times a day for heart    Accupril 10 Mg Tabs (Quinapril hcl) .Marland Kitchen... 1 by mouth daily  Problem # 5:  ANXIETY DEPRESSION (ICD-300.4) Assessment: Improved  Problem # 6:  HYPOTHYROIDISM (ICD-244.9) Assessment: Improved  The following medications were removed from the medication list:    Synthroid 200 Mcg Tabs (Levothyroxine sodium) .Marland Kitchen... 1 qd  Orders: Venipuncture (65784) Specimen Handling (69629) TLB-TSH (Thyroid Stimulating Hormone) (84443-TSH)  Problem # 7:  HYPERLIPIDEMIA (ICD-272.4) Assessment: Improved  Her updated medication list for this problem includes:    Lovastatin 40 Mg Tabs (Lovastatin) .Marland Kitchen... 1 hs    Colestipol Hcl 1 Gm Tabs (Colestipol hcl) .Marland Kitchen... 1 qd  Complete Medication List: 1)  Benazepril Hcl 20 Mg Tabs (Benazepril hcl) .... One by mouth daily 2)  Lovastatin 40 Mg Tabs (Lovastatin) .Marland Kitchen.. 1 hs 3)  Aspirin Adult Low Strength 81 Mg Tbec (Aspirin) .Marland Kitchen.. 1 qd 4)  Propranolol Hcl 20 Mg Tabs (Propranolol hcl) .Marland Kitchen.. 1 two times a day for heart 5)  Hydrocodone-acetaminophen 10-660 Mg Tabs (Hydrocodone-acetaminophen) .Marland Kitchen.. 1 q4h as needed pain, not over 4 per day 6)  Omeprazole 40 Mg Cpdr (Omeprazole) .... Once daily 7)  Multi-day  Tabs (Multiple vitamin) 8)  Oscal 500/200 D-3 500-200 Mg-unit Tabs (Calcium carbonate-vitamin d) 9)  Colestipol Hcl 1 Gm Tabs (Colestipol hcl) .Marland Kitchen.. 1 qd 10)  Vitamin D 400 Unit Tabs (Cholecalciferol) 11)  Co Q-10 150 Mg Caps (Coenzyme q10) 12)  Accupril 10 Mg Tabs (Quinapril hcl) .Marland Kitchen.. 1 by mouth daily  Patient Instructions: 1)  You are doing fine 2)  recheck thyroid, will call results lab 3)  refilled medication Prescriptions: ACCUPRIL 10 MG TABS (QUINAPRIL HCL) 1 by mouth daily  #30 x 11   Entered and Authorized by:   Judithann Sheen MD   Signed by:   Judithann Sheen MD on 02/06/2010   Method used:   Electronically to        [facility]. [phone]* (retail)       [address]       [place], [place]  [address]       Ph: [phone]       Fax: [phone]   RxID:   [id number] SYNTHROID 175 MCG TABS (LEVOTHYROXINE SODIUM) 1 qd  #30 x 11  Entered and Authorized by:   Judithann Sheen MD   Signed by:   Judithann Sheen MD on 02/06/2010   Method used:   Print then Give to Patient   RxID:   [id number] HYDROCODONE-ACETAMINOPHEN 10-660 MG TABS (HYDROCODONE-ACETAMINOPHEN) 1 q4h as needed pain, not over 4 per day  #100 x 5   Entered and Authorized by:   Judithann Sheen MD   Signed by:   Judithann Sheen MD on 02/05/2010   Method used:   Print then Give to Patient   RxID:   [id number] LOVASTATIN 40 MG  TABS (LOVASTATIN) 1 hs  #90 x 3   Entered and Authorized by:   Judithann Sheen MD   Signed by:   Judithann Sheen MD on 02/05/2010   Method used:   Electronically to        [facility]. [phone]* (retail)       [address]       [place], [place]  [address]       Ph: [phone]       Fax: [phone]   RxID:   [id number] OMEPRAZOLE 40 MG CPDR (OMEPRAZOLE) once daily  #90 x 3   Entered and Authorized by:   Judithann Sheen MD   Signed by:   Judithann Sheen MD on 02/05/2010   Method used:   Electronically to        [facility]. [phone]* (retail)       [address]       [place], [place]  [address]       Ph: [phone]       Fax: [phone]   RxID:   [id number]    Orders Added: 1)  Venipuncture [09381] 2)  Specimen Handling [99000] 3)  TLB-TSH (Thyroid Stimulating Hormone) [84443-TSH] 4)  Est. Patient Level IV [82993]

## 2010-03-09 ENCOUNTER — Ambulatory Visit (INDEPENDENT_AMBULATORY_CARE_PROVIDER_SITE_OTHER): Payer: Medicare Other | Admitting: Internal Medicine

## 2010-03-09 ENCOUNTER — Encounter: Payer: Self-pay | Admitting: Internal Medicine

## 2010-03-09 VITALS — BP 120/80 | HR 98 | Temp 97.5°F | Ht 62.5 in | Wt 172.0 lb

## 2010-03-09 DIAGNOSIS — M76899 Other specified enthesopathies of unspecified lower limb, excluding foot: Secondary | ICD-10-CM

## 2010-03-09 DIAGNOSIS — M7062 Trochanteric bursitis, left hip: Secondary | ICD-10-CM

## 2010-03-09 DIAGNOSIS — J029 Acute pharyngitis, unspecified: Secondary | ICD-10-CM

## 2010-03-09 DIAGNOSIS — M7060 Trochanteric bursitis, unspecified hip: Secondary | ICD-10-CM

## 2010-03-09 MED ORDER — DICLOFENAC POTASSIUM 50 MG PO TABS: 50.0000 mg | ORAL_TABLET | Freq: Two times a day (BID) | ORAL | Status: DC | PRN

## 2010-03-09 NOTE — Assessment & Plan Note (Signed)
Rapid strep obtained and reviewed neg. Recommend otc sx tx prn. Followup if no improvement or worsening.

## 2010-03-09 NOTE — Assessment & Plan Note (Signed)
Attempt short term course of diclofenac prn with food and no other nsaids. Denies h/o pud or cri.

## 2010-03-09 NOTE — Progress Notes (Signed)
  Subjective:    Patient ID: Alexis Banks, female    DOB: 1945-07-08, 65 y.o.   MRN: [id number]  HPI  Pt presents to clinic for evaluation of ST and hip pain. Notes 2d h/o ST, throat drainage, frontal ha and right ear popping. Denies f/c, cough, dyspnea or wheezing. No known sick exposure. Took tylenol for the ha. No alleviating or exacerbating factors. Also note intermittent L>R upper out leg pain in the distribution of greater trochanter. +tenderness when lies on that side and sometimes difficult to arise from a chair after being seated. No injury/trauma and denies hip pain. Has attempted no medication for this problem.   Reviewed PMH, medications and allergies.    Review of Systems See HPI     Objective:   Physical Exam  Constitutional: She appears well-developed and well-nourished. No distress.  HENT:  Head: Normocephalic and atraumatic.  Right Ear: Tympanic membrane, external ear and ear canal normal.  Left Ear: Tympanic membrane, external ear and ear canal normal.  Nose: Nose normal.  Mouth/Throat: Mucous membranes are normal. Posterior oropharyngeal erythema present. No oropharyngeal exudate, posterior oropharyngeal edema or tonsillar abscesses.  Eyes: Conjunctivae are normal. Right eye exhibits no discharge. Left eye exhibits no discharge. No scleral icterus.  Neck: Neck supple.  Musculoskeletal:       +tenderness over left greater trochanter without bony abn.  Able to wt bear and ambulate without assistance. FROM of bilateral hips.  Lymphadenopathy:    She has no cervical adenopathy.  Skin: Skin is warm and dry. No rash noted. She is not diaphoretic. No erythema.          Assessment & Plan:

## 2010-06-02 NOTE — Op Note (Signed)
[name], [name]                 ACCOUNT NO.:  [id number]   MEDICAL RECORD NO.:  [id number]          PATIENT TYPE:  OIB   LOCATION:  1530                         FACILITY:  [facility]   PHYSICIAN:  Georges Lynch. Gioffre, M.D.DATE OF BIRTH:  1945-11-12   DATE OF PROCEDURE:  01/17/2008  DATE OF DISCHARGE:                               OPERATIVE REPORT   SURGEON:  Georges Lynch. Darrelyn Hillock, M.D.   ASSISTANT:  Jamelle Rushing, P.A.   PREOPERATIVE DIAGNOSIS:  1. Complete tear of the rotator cuff tendon right shoulder.  2. Severe impingement right shoulder.   POSTOPERATIVE DIAGNOSIS:  1. Complete tear of the rotator cuff tendon right shoulder.  2. Severe impingement right shoulder.   OPERATION:  1. Open acromionectomy and acromioplasty right shoulder.  2. Repair of a torn rotator cuff tendon right shoulder.  3. TissueMend tendon graft to the rotator cuff tendon right shoulder      utilizing the graft 2x2 in size and one PEEK anchor.   PROCEDURE IN DETAIL:  Under general anesthesia the patient first had 1  gram of IV Ancef.  Routine orthopedic prep and drape of the right  shoulder was carried out.  I then made an incision over the anterior  aspect of the right shoulder.  Bleeders identified and cauterized.  Incision was carried out to the acromion and by sharp dissection I  dissected the deltoid tendon from the acromion in the usual fashion.  I  then exposed the acromion.  She had downsloping of the acromion.  She  had a large hole in the rotator cuff tendon which was noted after I  removed the subdeltoid bursa.  Following that I protected the rotator  cuff.  I then went down and protected the cuff with the Bennett  retractor.  Utilizing an oscillating and a bur I did a partial  acromionectomy and acromioplasty.  I then bone waxed the undersurface of  the acromion after we irrigated out the area.  Following that I repaired  primarily the hole in the tendon after I burred the lateral articular  surface of the humerus.  After that was done I inserted one PEEK anchor  with four sutures and sutured the TissueMend graft down over the repair  site.  The wound was thoroughly irrigated.  I inserted some thrombin  soaked Gelfoam in the subacromial space.  The wound was closed in a  layered  usual fashion after I reapproximated the deltoid tendon and muscle note  prior to surgery.  A sterile Neosporin dressing was applied and she was  placed in a shoulder immobilizer.  Prior to surgery in the holding area  she was given an interscalene nerve block.           ______________________________  Georges Lynch. Darrelyn Hillock, M.D.     RAG/MEDQ  D:  01/17/2008  T:  01/17/2008  Job:  [id number]   cc:   Ellin Saba., MD  [address] [place]  [place] [address]

## 2010-06-05 NOTE — Op Note (Signed)
Alexis Banks, Alexis Banks                 ACCOUNT NO.:  [id number]   MEDICAL RECORD NO.:  [id number]          PATIENT TYPE:  AMB   LOCATION:  ENDO                         FACILITY:  [facility]   PHYSICIAN:  Petra Kuba, M.D.    DATE OF BIRTH:  Apr 03, 1945   DATE OF PROCEDURE:  12/03/2005  DATE OF DISCHARGE:                                 OPERATIVE REPORT   SURGEON:  Petra Kuba, M.D.   PROCEDURE:  Colonoscopy with biopsy.   INDICATIONS:  Family history of colon polyps.   CONSENT:  Consent was signed after the risks, benefits, methods and options  were thoroughly discussed multiple times in the past.   MEDICINES USED:  Fentanyl 100 mcg, Versed 10 mg.   DESCRIPTION OF PROCEDURE:  Rectal inspection was pertinent for external  hemorrhoids, small.  Digital exam was negative.  The video pediatric  adjustable colonoscope was inserted, and with abdominal pressure fairly  easily able to advance to the cecum.  Other than the significant left  greater than rare right diverticula, no abnormality was seen on insertion.  The cecum was identified by the appendiceal orifice and the ileocecal valve.  In fact, the scope was inserted a short way into the terminal ileum, which  was normal.  Photodocumentation was obtained.  The prep on the right side  was better than the left.  Moderate washing and suctioning were done on the  left, but on slow withdrawal through the colon, other than the diverticula  mentioned above, a tiny questionable mid-descending polyp was seen and cold  biopsied x2, but no other abnormalities were seen.  The left side did have  small wall edema, and the prep there required a moderate amount of washing  and suctioning, which made complete visualization difficult, but no  abnormalities were seen.  As a result, we withdrew back to the rectum.  Once  there, anorectal pull-through and retroflexion confirmed there were small  hemorrhoids.  The scope was straightened and readvanced a short  way up the  left side of the colon.  Air was suctioned and the scope removed.  The  patient tolerated the procedure well.  There was no obvious immediate  complication.   ENDOSCOPIC DIAGNOSES:  1. Internal and external hemorrhoids.  2. Left greater than rare right diverticula.  3. Preparation better on the right than the left.  4. Tiny questionable polyp, mid-descending, status post biopsy.  5. Otherwise, within normal limits to the terminal ileum.   PLAN:  1. Await pathology.  2. Happy to see back p.r.n.; otherwise, probably repeat colon screening in      5 years.  Might even consider a virtual colonoscopy, if widely      acceptable at that junction.           ______________________________  Petra Kuba, M.D.     MEM/MEDQ  D:  12/03/2005  T:  12/04/2005  Job:  [id number]   cc:   Candyce Churn, M.D.

## 2010-06-05 NOTE — Discharge Summary (Signed)
NAME:  Alexis Banks, Alexis Banks                           ACCOUNT NO.:  [id number]   MEDICAL RECORD NO.:  [id number]                   PATIENT TYPE:  INP   LOCATION:  0381                                 FACILITY:  [facility]   PHYSICIAN:  Velora Heckler, M.D.                DATE OF BIRTH:  1945/03/26   DATE OF ADMISSION:  03/01/2003  DATE OF DISCHARGE:  03/04/2003                                 DISCHARGE SUMMARY   REASON FOR ADMISSION:  Abdominal pain, cholelithiasis, choledocholithiasis.   HISTORY:  The patient is a 65 year old white female employed in [employer] who presents with a two week history of abdominal pain  radiating to the back associated with nausea and vomiting.  The patient was  seen in the emergency department.  Ultrasound demonstrated multiple  gallstones and a dilated common bile duct.  Liver function tests were  abnormal.  The patient was admitted under the general surgical service.   HOSPITAL COURSE:  The patient was admitted from the emergency department on  March 01, 2003.  She was taken directly to the operating room where she  underwent laparoscopic cholecystectomy with intraoperative cholangiography.  She was found to have a distal common bile duct stone with partial  obstruction at the ampulla.  Postoperatively, she was seen by Dr. Roosvelt Harps from gastroenterology.  On March 02, 2003, she underwent upper  endoscopy followed by ERCP with sphincterotomy and stone extraction.  The  course was complicated by an abnormal EKG.  The patient was seen in  consultation by Dr. Delane Ginger from cardiology.  The patient's cardiac  enzymes were all negative.  A followup EKG was normal.  The initial EKG  likely had erroneous lead placement and there was no acute myocardial event  diagnosed.   The patient's diet was gradually advanced.  Her liver function tests were  monitored and continued to improve during her remaining hospital stay.  She  was  prepared for discharge home on the third postoperative day.   DISCHARGE PLANNING:  The patient is discharged home on March 04, 2003, in  good condition, tolerating a regular diet, and ambulating independently.  The patient will be seen back at my office at [facility] in  two weeks with repeat liver function tests.   DISCHARGE MEDICATIONS:  1. Vicodin as needed for pain.  2. Other home medications as per usual.   FINAL DIAGNOSES:  1. Chronic cholecystitis.  2. Cholelithiasis.  3. Choledocholithiasis.   CONDITION ON DISCHARGE:  Improved.                                               Velora Heckler, M.D.    TMG/MEDQ  D:  03/04/2003  T:  03/04/2003  Job:  [id number]   cc:   Althea Grimmer. Luther Parody, M.D.  [address]  [place]  [place] [address]  Fax: [phone]   Petra Kuba, M.D.  [address], [address]  [place]  [place] [address]  Fax: [phone]   Lyn Records III, M.D.  [address]  [place]  [place] [address]  Fax: [phone]   Vesta Mixer, M.D.  [address], [address]  [place]  [place] [address]  Fax: [phone]

## 2010-06-05 NOTE — Cardiovascular Report (Signed)
NAME:  Alexis Banks, Alexis Banks                           ACCOUNT NO.:  [id number]   MEDICAL RECORD NO.:  [id number]                   PATIENT TYPE:  INP   LOCATION:  1832                                 FACILITY:  [facility]   PHYSICIAN:  Lyn Records III, M.D.            DATE OF BIRTH:  1945-02-26   DATE OF PROCEDURE:  04/25/2002  DATE OF DISCHARGE:                              CARDIAC CATHETERIZATION   INDICATIONS FOR PROCEDURE:  Clinical presentation with features consistent  with acute coronary syndrome including new EKG abnormalities with  anterolateral T wave inversion, a story compatible with recurrent episodes  of chest discomfort lasting up to 20 minutes, awakening patient from sleep,  and trace positive troponin 0.09.   PROCEDURE:  1. Left heart catheterization.  2. Selective coronary angiography.  3. Left ventriculography.   CARDIOLOGIST:  Lyn Records, M.D.   DESCRIPTION OF PROCEDURE:  After informed consent, a 6-French sheath was  placed in the right femoral artery using the modified Seldinger technique.  A 6-French A2 multipurpose catheter was used for hemodynamic recordings,  left ventriculography, and selective left and right coronary angiography.  The patient tolerated the procedure without significant complications.   RESULTS:  1. HEMODYNAMIC DATA:     a. Aortic pressure 129/78.     b. Left ventricular pressure 129/12.  2. LEFT VENTRICULOGRAPHY:  The left ventricular cavity is normal in size.     There is mid anterolateral wall mild to moderate hypokinesis.  EF is     approximately 50%.  No mitral regurgitation is noted.  3. SELECTIVE CORONARY ANGIOGRAPHY     a. Left main coronary: Normal.     b. Left anterior descending coronary:  The LAD is large, gives origin to        two diagonals.  The LAD wraps the left ventricular apex.  No        significant abnormalities are noted.     c. Circumflex artery:  The patient circumflex is large, giving origin to        one  obtuse marginal branch that trifurcates on the left lateral wall.        In the mid vessel after a regional tortuosity, there is an eccentric        40 to 50% stenosis with some mild suggestion of lucency within it.        This vessel appears to cover the mid anterolateral wall potentially in        the distribution of the wall motion abnormality noted.     d. Right coronary artery:  The right coronary artery is large, gives a        PDA and two small left ventricular branches. The right coronary is        clean/normal.   CONCLUSIONS:  1. Moderate mid circumflex disease with mild suggestion of lucency raising     the possibility that  the circumflex in this region is recanalized and     suggests that perhaps the patient's symptoms last evening were related to     acute plaque rupture with thrombosis and subsequent recannulization.  2. Wall motion abnormality noted on ventriculography with mid anterolateral     wall moderate hypokinesis.  Ejection fraction 50%.  3. Left anterior descending artery and right coronary artery are normal.   DISCUSSION:  There is no way to verify whether or not the patient's  presentation is due to transient thrombosis/occlusion of the mid circumflex,  although the findings to this point seem to suggest this.   PLAN:  Will discontinue Integrilin and Angiomax.  Start Plavix. Will  administer Plavix for least three months, aspirin once a day.  Serial  cardiac enzymes.  Consider management of hypertension with either ACE  inhibitor or calcium channel blocker long term.  Risk factor modification  including lipid lowering.                                               Lesleigh Noe, M.D.    HWS/MEDQ  D:  04/25/2002  T:  04/26/2002  Job:  [id number]

## 2010-06-05 NOTE — Procedures (Signed)
[facility]  Patient:    Alexis Banks, Alexis Banks                        MRN: [id number] Proc. Date: 05/06/00 Adm. Date:  [id number] Attending:  Nelda Marseille CC:         Pearla Dubonnet, M.D.   Procedure Report  PROCEDURE:  Colonoscopy.  ENDOSCOPIST:  Petra Kuba, M.D.  INDICATIONS:  Chronic anemia, due for colonic screening.  Consent was signed after risks, benefits, methods, and options were thoroughly discussed on multiple occasions.  MEDICINES USED:  Demerol 80, Versed 9.  DESCRIPTION OF PROCEDURE:  Rectal inspection was pertinent for external hemorrhoids.  Digital exam was negative.  The video pediatric colonoscope was inserted and easily advanced around the colon to the cecum.  We had some minimal difficulty in advancing a tortuous diverticula-filled sigmoid, but once we passed that area, with abdominal pressure and rolling her on her back, we were able to advance to the cecum which was identified by the appendiceal orifice and the ileocecal valve.  The scope was inserted a short ways in the terminal ileum which was normal.  Photo documentation was obtained.   The scope was slowly withdrawn.  Prep was adequate.  There was some liquid stool that required washing and suctioning.  Cecum, ascending, and transverse were normal.  There was some left-sided diverticula, particularly in the sigmoid, but no polypoid lesions, masses, or signs of bleeding were seen as we slowly withdrew back to the rectum.  Once back in the rectum, the scope was retroflexed, pertinent for some internal hemorrhoids.  The scope was then straightened, air was withdrawn, and the scope was removed.  The patient tolerated the procedure well.  There was no obvious immediate complication.  ENDOSCOPIC DIAGNOSIS: 1. Internal and external hemorrhoids. 2. Left-sided diverticula. 3. Otherwise within normal limits to the terminal ileum.  PLAN:  Continue workup with an EGD.   Yearly rectals and guaiacs per Dr. Kevan Ny as well as following CBC and administering iron.  Repeat screening p.r.n. or in five years. DD:  05/06/00 TD:  05/07/00 Job: [id number]

## 2010-06-05 NOTE — H&P (Signed)
NAME:  Alexis Banks, Alexis Banks                           ACCOUNT NO.:  [id number]   MEDICAL RECORD NO.:  [id number]                   PATIENT TYPE:  INP   LOCATION:  0464                                 FACILITY:  [facility]   PHYSICIAN:  Velora Heckler, M.D.                DATE OF BIRTH:  [birth year]   DATE OF ADMISSION:  02/28/2003  DATE OF DISCHARGE:                                HISTORY & PHYSICAL   CHIEF COMPLAINT:  Abdominal pain, gallstones.   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 This has been  associated with nausea and vomiting. The patient denies fever or chills. She  was seen in the emergency department on the evening of February 9th. An  ultrasound was performed which showed multiple gallstones with a dilated  common bile duct. Amylase and lipase were normal. White count was normal.  The patient was discharged home with pain medication and scheduled to be  seen in my office in one week. The patient returned to the emergency  department late on the evening of February 10th with persistent abdominal  pain radiating to the back associated with nausea. The patient was admitted  on the morning of February 11th to my surgical service. She is now seen for  preparation for the operating room.   PAST MEDICAL HISTORY:  Status post bilateral tubal ligation, history of  hypertension, history of gastroesophageal reflux disease, history of  hypothyroidism, history of hypercholesterolemia.   MEDICATIONS:  Lipitor, Nexium, Synthroid, Vicodin, and Phenergan p.r.n.   DRUG ALLERGIES:  None known.   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 She does not smoke. She does not drink alcohol. She is accompanied by  her husband.   FAMILY HISTORY:  Noncontributory.   REVIEW OF SYSTEMS:  The  patient denies any previous hepatobiliary disease.  She denies any previous pancreatic disease. She has had no history of  jaundice or acholic stools. She has had no fevers or chills. A 15-system  review is also discussed with the patient without significant other  positives, except as noted above.   PHYSICAL EXAMINATION:  GENERAL: A 65 year old, ill-appearing white female on  ward 4-west at  [employer].  VITAL SIGNS: Temperature 100.3, pulse 83, respirations 18, blood pressure  127/67.  HEENT: Normocephalic and atraumatic. Sclerae are clear. Conjunctiva clear.  Pupils equal and reactive. Dentition is good. Mucous membranes are slightly  dry.  NECK: Supple without mass. Thyroid is normal without nodularity. There is no  anterior-posterior cervical lymphadenopathy.  LUNGS: Clear to auscultation bilaterally without rales or rhonchi.  CARDIOVASCULAR: Regular rate and rhythm without murmur.  Peripheral pulses  are full.  ABDOMEN: Soft without distention. There are bowel sounds. There is  tenderness in the right upper quadrant at the midline of the midclavicular  line. There is a suggestion of a mass with mild voluntary guarding. There is  no Murphy sign. There is a well-healed surgical wound at the umbilicus.  There is no sign of herniation. The remainder of the abdomen is soft and  nontender.  EXTREMITIES: Nontender without edema.  NEUROLOGIC: The patient is alert and oriented without focal deficits.   LABORATORY STUDIES:  White count 5.7, hemoglobin 12.2, platelet count  300,000. Differential shows 51% neutrophils and 37% lymphocytes. Chemistry  profile is notable for a potassium of 3.1.  Liver function tests shows an  SGOT of 205, SGPT 256, alkaline phosphatase 234, total bilirubin 1.5.  Amylase normal at 44 and lipase normal at 36. Urinalysis is not performed.   EKG pending in the holding area.   IMPRESSION:  1. Cholelithiasis.  2. Chronic cholecystitis.  3. Rule out  common bile duct stone.   PLAN:  I discussed at length with Ms. Clagett and her husband the indications  for surgery. I explained laparoscopic cholecystectomy. I discussed the  potential need for conversion to open surgery. I discussed common bile duct  stones and their endoscopic management by the gastroenterologist. We  discussed the procedure, the hospital stay, and recovery to be expected.  They understand and wish to proceed.  Will make arrangements with the  operating room for surgery.                                               Velora Heckler, M.D.    TMG/MEDQ  D:  03/01/2003  T:  03/01/2003  Job:  [id number]   cc:   Lyn Records III, M.D.  [address]  [place]  [place] [address]  Fax: [phone]   Petra Kuba, M.D.  [address], [address]  [place]  [place] [address]  Fax: [phone]   Dr. Kinnie Feil at [facility]

## 2010-06-05 NOTE — Procedures (Signed)
[facility]  Patient:    Alexis Banks, Alexis Banks                          MRN: [id number] Proc. Date: 05/06/00 Attending:  Petra Kuba, M.D. CC:         Pearla Dubonnet, M.D.   Procedure Report  PROCEDURE:  Esophagogastroduodenoscopy with biopsy.  ENDOSCOPIST:  Petra Kuba, M.D.  ANESTHESIA:  General anesthesia per patient request.  INDICATIONS:  Patient with longstanding dysphagia helped by pump inhibitors, also with anemia.  Nondiagnostic colonoscopy.  Want to proceed with a one-time endoscopy with biopsy and to rule out Barretts.  Consent was signed prior to any premedications given after risks, benefits, methods, and options were discussed on multiple occasions in the past.  ADDITIONAL MEDICINES FOR THIS PROCEDURE:  Demerol 10 mg, Versed 1 mg.  DESCRIPTION OF PROCEDURE:  Video endoscope was inserted by direct vision.  The vocal cords were normal.  The esophagus was slightly tortuous but normal.  In the distal esophagus was a small to medium size hiatal hernia with a widely patent ring just above it.  The scope passed easily into the stomach, advanced through the antrum pertinent for some minimal antritis, advanced through a normal pylorus into a normal duodenal bulb and around the cecum to a normal second portion of the duodenum.  A few scattered distal duodenal biopsies were obtained to rule out any malabsorption.  The scope was withdrawn back to the bulb, and a good look there ruled out ulcers at that location.  Scope was withdrawn back to the stomach and retroflexed.  The angularis, lesser and greater curve, and fundus were normal on retroflexion.  High in the cardia, the hiatal hernia was confirmed.  The scope was straightened and straight visualization of the stomach confirmed the the minimal antritis, ruled out any other abnormalities.  A few biopsies of the antrum and two of the proximal stomach were obtained to rule out Helicobacter or any  other abnormality.  Air was suctioned and scope removed.  Again, a good look a the esophagus on slow withdrawal was normal.  Scope was removed.  The patient tolerated the procedure well, and there was no obvious immediate complication.  ENDOSCOPIC DIAGNOSES: 1. Small to medium hiatal hernia with a widely patent ring just above it. 2. Minimal antritis status post biopsy. 3. Otherwise normal esophagogastroduodenoscopy status post proximal stomach    and distal duodenal biopsies to rule out malabsorption.  PLAN:  Await pathology.  Continue Prilosec since it is helping.  Dilation p.r.n.  Otherwise follow up with me p.r.n. and return care to Dr. Kevan Ny for recheck guaiacs, CBC, and deciding any other workup and plan.  Happy to see back p.r.n. DD:  05/06/00 TD:  05/07/00 Job: [id number]

## 2010-06-05 NOTE — Consult Note (Signed)
NAME:  Alexis Banks, Alexis Banks                           ACCOUNT NO.:  [id number]   MEDICAL RECORD NO.:  [id number]                   PATIENT TYPE:  INP   LOCATION:  0464                                 FACILITY:  [facility]   PHYSICIAN:  Althea Grimmer. Luther Parody, M.D.            DATE OF BIRTH:  04-Sep-1945   DATE OF CONSULTATION:  03/01/2003  DATE OF DISCHARGE:                                   CONSULTATION   HISTORY OF PRESENT ILLNESS:  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 She underwent  laparoscopic cholecystectomy  this afternoon with an  intraoperative cholangiogram  which demonstrated a distal common bile duct  stone with a partial obstruction. Her liver function tests are abnormal with  an alkaline phosphatase of 234, an SGOT of 205 and SGPT  of 256. Her amylase  and lipase have been normal. White blood count is 5.7, platelet count  normal. She is receiving IV Ancef.   Currently she is postoperative, and she states that she is having post  surgical pain. A drain has been left in place in case there is any  developing bile leak due to increased  biliary pressure from the  obstruction.   PAST MEDICAL HISTORY:  1. Reflux disease.  2. Hypertension.  3. Hypercholesteremia.   PAST SURGICAL HISTORY:  1. Tubal ligation.  2. She had an upper endoscopy which showed a small  hiatal hernia in 2002.     Biopsies suggested rare H. Pylori, treatment status unknown. Colonoscopy     demonstrated left-sided diverticula at the same time. This was done for     anemia, but currently she is not anemic.   MEDICATIONS:  Other medications include Lipitor, Nexium and Synthroid.   ALLERGIES:  None reported.   FAMILY HISTORY:  Noncontributory.   SOCIAL HISTORY:   Nonsmoker, nondrinker.   REVIEW OF SYSTEMS:  General no weight loss or night sweats. ENDOCRINE:  No  known  diabetes. She is hypothyroid, on supplementation. SKIN:  No rash or  pruritus. EYES:  No icterus or change in vision. ENT:  No aphthous ulcers or  chronic sore throat. RESPIRATORY:  No shortness of breath, cough  or  wheezing. CARDIAC: She did undergo a cardiac catheterization last year which  showed some atherosclerotic disease. She had been having chest pain and one  would wonder whether this was really biliary pain. The coronary disease was  thought to be medically manageable. GI:  As above. GU:  No dysuria or  hematuria.   PHYSICAL EXAMINATION:  GENERAL:  She is a well developed, mildly overweight  adult female in some postoperative pain.  VITAL SIGNS:  Afebrile at the current time with  a T-max today of 100.7.  Current blood pressure 108/85, pulse 97.  SKIN:  Normal.  HEENT:  Eyes anicteric. Oropharynx unremarkable.  CHEST:  Clear.  HEART:  Regular rate and rhythm.  ABDOMEN:  Multiple laparoscopic dressings and is diffusely mildly tender.  There are no bowel sounds. There is a Jackson-Pratt drain in place with  minimal serosanguinous fluid.  EXTREMITIES:  No edema.   IMPRESSION:  A 65 year old female who is status post  cholecystectomy  today  with a retained common bile duct stone with abnormal liver function tests  and partial obstruction.   PLAN:  Semi urgent ERCP with sphincterotomy and stone extraction is  scheduled for tomorrow morning. The procedure is discussed with the patient  and her family  in terms of technique, preparation and risks of  complications including  bleeding, perforation and pancreatitis. They agree  to proceed. Please see the orders.                                               Althea Grimmer. Luther Parody, M.D.    PJS/MEDQ  D:  03/01/2003  T:  03/01/2003  Job:  [id number]   cc:   Velora Heckler, M.D.  [address]  [place]  [place] [address]  Fax:  [phone]   Petra Kuba, M.D.  [address], [address]  [place]  [place] [address]  Fax: [phone]

## 2010-06-05 NOTE — Discharge Summary (Signed)
NAME:  Alexis Banks, Alexis Banks                           ACCOUNT NO.:  [id number]   MEDICAL RECORD NO.:  [id number]                   PATIENT TYPE:  INP   LOCATION:  6533                                 FACILITY:  [facility]   PHYSICIAN:  Lyn Records, M.D.                DATE OF BIRTH:  03-Jul-1945   DATE OF ADMISSION:  04/25/2002  DATE OF DISCHARGE:  04/26/2002                                 DISCHARGE SUMMARY   ADMISSION DIAGNOSES:  1. Acute coronary syndrome; rule out myocardial infarction.  2. Borderline hypertension.  3. Reflux.  4. Hypothyroidism.   DISCHARGE DIAGNOSIS:  1. Acute coronary syndrome resolved, aborted myocardial infarction.  2. Essentially normal coronary arteries with a hazy mid circumflex -     thrombus, treated with antiplatelet/thrombin inhibitors medical therapy.  3. Lipid status unknown, will treat with Lipitor.  4. Hypothyroidism, TSH elevated, needs follow up.   HISTORY OF PRESENT ILLNESS:  The patient is a 65 year old white female  without prior cardiac history except borderline elevated blood pressure.  She thinks her cholesterol has been okay in the past.  No prior history of  chest pain.  The patient was awoken out of sleep around 1:30 in the morning  secondary to 5/10 chest pressure substernally with associated nausea and  diaphoresis.  This lasted about three hours.  She did not take anything.  She was finally able to fall back asleep.  She woke around 5 in the morning  and the discomfort was gone.   She decided to go to Dr. Jearl Klinefelter office to be further evaluated.  EKG  interpreted as marked ST inversion in the precordial/lateral leads.  Aspirin  was given.  The patient was sent to the  ER via EMS.  Currently, she is pain-  free.  EKG in the hospital shows sinus rhythm/sinus tachycardia with a heart  rate of 104 and nonspecific changes in the II, III and aVF leads, Q-wave in  V1 and V2 and ST flat/inverted in V4-V6.   Initial cardiac enzymes show CK 98,  MB 1.4 and Troponin 0.09.  The patient  will be admitted with acute coronary syndrome to rule out myocardial  infarction.   I have discussed this case with Dr. Katrinka Blazing.  He recommends starting  Integrilin and heparin per pharmacy.  We will plan cardiac catheterization  this afternoon.   She did have a low grade temperature and slightly elevated white blood cell  count on admission.  We will check an UA and C&S.   The risks, benefits and alternatives to cardiac catheterization have been  reviewed with the patient's family and they agree to proceed.   PROCEDURE:  Cardiac catheterization by:  Dr. Katrinka Blazing on April 25, 2002.   COMPLICATIONS:  None.   CONSULTATIONS:  Cardiac rehab phase I.   HOSPITAL COURSE:  The patient was admitted to the hospital on April 25, 2002,  with acute coronary syndrome.  She was treated with IV heparin, IV  Integrilin in the emergency room.  She was pain-free; therefore,  nitroglycerin was not started.  Blood pressure was stable.  She was slightly  tachycardic in the emergency room and Lopressor 5 mg IV was given.   The risks and benefits of cardiac catheterization were reviewed with the  patient and her family and they agreed to proceed.   PREPROCEDURE LABORATORY DATA:  INR 1.1.  Potassium 4.1, BUN 14, creatinine  0.9.  WBC 11.6, hemoglobin 12.8 and platelets 250.   Temperature was 100.2.  Blood pressure 120/72.  SAO2 96% on room air.   Chest x-ray showed no active disease.   PROCEDURES:  Cardiac catheterization preformed by Dr. Katrinka Blazing revealed the  left main to be without significant  lesions, LAD essentially normal, RCA  essentially normal.  The circumflex had a 60% lesion which was slightly  hazy.  LV-gram revealed mid anterior moderate hypokinesis with an EF of  greater than or equal to 50%.  Dr. Katrinka Blazing felt the patient had mild to  moderate mid circumflex disease and suspected this as the culprit for her  wall motion abnormality.  He suspected thrombus  with recannulization.  Otherwise, clear coronary anatomy.  He recommends aggressive risk factor  modification including antihypertensive medication, statin therapy, ACE  inhibitor, aspirin and Plavix.   Angiomax was used and Integrilin as mentioned before was started in the  emergency room.  Note:  She was seen by the [facility] prior to catheterization.  The patient consented for the acuity  trial and was randomized to 2b3a inhibitor and bivalirudin up front.  Heparin was stopped at 1345.  Integrilin was given and bivalirudin bolus and  drip was started at 1405.  The patient tolerated the procedure well.   Postprocedure she had no complications.  The sheath was pulled without  untoward events.  On April 26, 2002, the patient remained chest pain-free and  hemodynamically stable.   LABORATORY DATA:  Showed a CK 98, 87, 65; MB 1.4, 1.2, 0.8; Troponin-I 0.09,  0.06, 0.04.  Potassium was low at 3.2 and was replenished.  BUN 11,  creatinine 0.8.  TSH was elevated at 11.635.   The right groin was stable without bruits or hematoma.  Distal pulses were  intact.   DISPOSITION:  The patient was seen and examined by Dr. Katrinka Blazing.  He felt that  the patient was stable for discharge to home.  She will be seen by cardiac  rehab prior to discharge; we referred for phase II.   DISCHARGE MEDICATIONS:  1. Enteric-coated aspirin 325 mg daily.  2. Plavix 75 mg a day x9 months.  3. Altace 5 mg daily.  4. Lipitor 20 mg at bedtime.  5. Synthroid 1.25 mg daily.  She is to call Dr. Earmon Phoenix office to have them     address her elevated TSH.  6. Nexium 40 mg daily.  7. Hydrochlorothiazide.  We are unsure if the dose is 12.5 mg or 25 mg     daily.  If it is 25 mg, she will take it every other day, if it is 12.5     mg she is to take it daily.  8. Nitroglycerin as needed for chest pain.  9. Tylenol as needed.   DISCHARGE INSTRUCTIONS: 1. No strenuous activity, lifting more  then five pounds or driving x2 days.  2. She may return to work on Monday per Dr.  Smith.  3. Low-fat, low-cholesterol, low-salt diet.  4. The patient may shower.  5. She is to call the office any problems or questions.  6. Cardiac rehab phase II.  7. She will see Dr. Katrinka Blazing Friday, May 11, 2002, at 10:30.  8. She will need a followup B-MET at that time to make sure that her     potassium level is stable and will need followup statin panel in the     future.     Cherie Ouch, P.A.-C                  Lyn Records, M.D.    TCJ/MEDQ  D:  04/26/2002  T:  04/27/2002  Job:  [id number]   cc:   Lacretia Leigh. Quintella Reichert, M.D.  [address] [place]  [place] [address]  Fax: [phone]

## 2010-06-05 NOTE — H&P (Signed)
NAME:  Alexis Banks, Alexis Banks                           ACCOUNT NO.:  [id number]   MEDICAL RECORD NO.:  [id number]                   PATIENT TYPE:  INP   LOCATION:  6533                                 FACILITY:  [facility]   PHYSICIAN:  Lyn Records, M.D.                DATE OF BIRTH:  09/05/1945   DATE OF ADMISSION:  04/25/2002  DATE OF DISCHARGE:                                HISTORY & PHYSICAL   ADMISSION DIAGNOSIS:  Acute coronary syndrome, rule out myocardial  infarction.   CHIEF COMPLAINT:  Three hours of chest pressure, nausea, and diaphoresis.   HISTORY OF PRESENT ILLNESS:  This is a 64 year old married white female  without prior cardiac history except for borderline elevated blood  pressure.  No prior history of chest pain.   The patient was woken out of sleep at 1:30 this morning secondary to 5/10  substernal chest pressure with associated nausea and diaphoresis.  She also  had chills.  This lasted about three hours.  She did not take anything.  She  was finally able to fall back asleep.  She awoke around 5 a.m. and the  discomfort was gone.  She decided to go to Dr. Jearl Klinefelter office to be  evaluated.   EKG interpreted at Dr. Jearl Klinefelter office shows marked ST inversion precordial  and lateral leads.  Aspirin was given.  She was sent to the ER by EMS.  That  EKG is currently not available although we have called for the record.  Currently, the patient is pain free.   ALLERGIES:  No known drug allergies.   MEDICATIONS:  1. Synthroid 1.25 mg a day.  2. Nexium 40 mg daily.  3. Hydrochlorothiazide 25 mg.   PAST MEDICAL HISTORY:  1. No CAD, murmur, seizure, or stroke.  No diabetes or kidney disease.  2. Borderline hypertension.  3. Normal coronaries in 2004.  4. Hypothyroidism.  5. Reflux--controlled.  She has been on PPI's for seven years.  6. History of esophageal dilatation seven years ago by Dr. Ewing Schlein.  7. Colonoscopy and EGD one year ago.  Things were okay.   SOCIAL  HISTORY:  The patient is married x42 years.  Mother of three,  grandmother of four.  No tobacco, alcohol, or illicit drug use.  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  No  regular exercise.  One soda a day.   FAMILY HISTORY:  Mother deceased age 46 of cirrhosis.  No CAD.  Father  deceased at 57 of COPD.  No CAD.  One sister age 57 has CAD.  She is a  patient of Dr. Katrinka Blazing and Dr. Benedetto Goad.   REVIEW OF SYSTEMS:  No fevers, chills, cough, or cold.  No shortness of  breath, dyspnea on exertion, wheeze, PND, or orthopnea.  No palpitations.  She does snore at night.  She does not  know if she is apneic.  She does have  reflux.  No peptic ulcer disease.  No melena, dysuria, hematuria, or edema.  No claudication.  She occasionally gets cramps in her thighs at night.  No  weight changes.   PHYSICAL EXAMINATION:  VITAL SIGNS:  Temperature is 100.2, blood pressure is  120/72, pulse 107, respirations 16, O2 96% on room air.  GENERAL:  The patient is alert x3 in no acute distress.  HEENT:  Normocephalic, atraumatic, PERRL, EOMI.  Nares patent.  Pharynx  benign.  Dentures upper and lower.  NECK:  Supple without bruits or masses.  LUNGS:  Clear to auscultation.  No wheezes or crackles.  CARDIOVASCULAR:  Regular rate and rhythm without murmurs, gallops, or rubs.  Normal S1 and S2.  ABDOMEN:  Soft, nontender, nondistended.  Normoactive bowel sounds x4  quadrants.  No Murphy's.  No bruits.  EXTREMITIES:  2+ femoral pulses bilaterally.  No bruits.  2+ peripheral  pulses bilaterally.  No edema.  NEUROLOGIC:  Nonfocal.  Mentation intact.  _______ equal hearing.  SKIN:  Warm.  No obvious lesions.   LABORATORY DATA:  EKG shows normal sinus rhythm/sinus tachycardia with a  heart rate of 104.  Nonspecific ST changes in II, III, and aVF; Q in V1 and  V2; ST is flat/inverted in V4-V6.  Chest x-ray shows no active disease per  radiology although there is a little  haziness of the left costophrenic angle  by my interpretation.   Potassium was 4.1, BUN 14, creatinine 0.9, glucose 119.  WBC elevated at  11.6, hemoglobin 12.8, and platelets 250.  CK 98, MB 1.4, and troponin  elevated at 0.9.  INR 1.1, PTT 42.   IMPRESSION:  1. Acute coronary syndrome.  Rule out myocardial infarction.  2. Abnormal electrocardiogram, inferolateral changes.  3. Borderline hypertension.  4. Normal cholesterol.  5. Hypothyroidism.  6. Elevated temperature and white cell count, question etiology.  7. Headache.   PLAN:  Admit.  Intravenous heparin.  Intravenous Integrilin.  Aspirin.  Tylenol for headache.  Urinalysis culture and sensitivity.  Cycle cardiac  enzymes.  Catheterization this afternoon.  Risks reviewed including but not  limited to infection, bleeding, myocardial infarction, cerebrovascular  accident, need for urgent bypass, death less than 1:1000.  The patient and  family agreed to proceed.   We will start nitroglycerin if she has recurrent chest pain.   Dr. Katrinka Blazing has been made aware of the patient.     Georgiann Cocker Jernejcic, P.A.                   Lyn Records, M.D.    TCJ/MEDQ  D:  04/25/2002  T:  04/26/2002  Job:  [id number]   cc:   Lacretia Leigh. Quintella Reichert, M.D.  [address] [place]  [place] [address]  Fax: [phone]

## 2010-06-05 NOTE — Op Note (Signed)
NAME:  Alexis Banks, Alexis Banks                           ACCOUNT NO.:  [id number]   MEDICAL RECORD NO.:  [id number]                   PATIENT TYPE:  INP   LOCATION:  0464                                 FACILITY:  [facility]   PHYSICIAN:  Velora Heckler, M.D.                DATE OF BIRTH:  May 03, 1945   DATE OF PROCEDURE:  03/01/2003  DATE OF DISCHARGE:                                 OPERATIVE REPORT   PREOPERATIVE DIAGNOSIS:  Cholelithiasis, chronic cholecystitis, rule out  choledocholithiasis.   POSTOPERATIVE DIAGNOSIS:  Cholelithiasis, chronic cholecystitis,  choledocholithiasis.   OPERATION/PROCEDURE:  Laparoscopic cholecystectomy intraoperative  cholangiography.   SURGEON:  Velora Heckler, M.D.   ASSISTANT:  Lebron Conners, M.D.   ANESTHESIA:  General per Dr. Ronelle Nigh.   ESTIMATED BLOOD LOSS:  Minimal.   PREPARATION:  Betadine.   COMPLICATIONS:  None.   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  Ultrasound  demonstrated multiple gallstones and a dilated CBD.  Laboratory studies  showed abnormal liver function tests.  The patient is now brought to  operating room for cholecystectomy and cholangiography.   DESCRIPTION OF PROCEDURE:  The procedure was done in OR #11 at the [facility].  The patient is brought to the operating room,  placed in the supine position on the operating room table.  Following the  administration of general anesthesia, the patient is prepped and draped in  the usual strict aseptic fashion.   After ascertaining adequate level of anesthesia had been obtained, an  infraumbilical incision is made  with a #15 blade.  Dissection is carried  down to the fascia.  Fascia is incised in the midline and the peritoneal  cavity is entered cautiously.  A 0 Vicryl pursestring suture is placed in  the fascia.  A Hasson cannula is introduced under direct vision  and secured  with the pursestring suture.  The abdomen is insufflated with carbon  dioxide.  Laparoscope is introduced under direct vision and the abdomen  explored.  Operative ports are placed along the right costal margin in the  midline, midclavicular line, and the anterior axillary line.  Fundus of the  gallbladder is grasped and retracted cephalad.  Adhesions to the  undersurface of the gallbladder were taken down with gentle blunt dissection  and hemostasis obtained with the electrocautery.  Duodenum is mobilized away  from the neck of the gallbladder.  Peritoneum at the neck of the gallbladder  was incised.  There was moderate edema.  Cystic duct is dissected out.  It  appears quite dilated.  Stones were visible in the proximal cystic duct.  These were milked back within the gallbladder.  Clip is placed at  the  junction of the neck of the gallbladder and cystic duct.  Cystic duct is  incised and clear  bile emanates under mild pressure.  A Cook cholangiography  catheter was inserted through a stab wound in the right upper quadrant and  placed into the cystic duct.  It is secured with a Ligaclip.  Using C-arm  fluoroscopy, real time cholangiography is performed.  There is rapid filling  of a dilated common bile duct.  There is reflux of contrast into what  appears to be a moderately dilated biliary tree.  Both right and left ductal  systems fill.  There is flow distally and there appears to be a filling  defect in the distal common bile duct at the level of the ampulla.  This  would appear to be approximately a 4-5 mm stone.  There is partial  obstruction of the ampulla with some contrast reaching the duodenum.  Additional irrigation of the duct with normal saline fails to dislodge the  stone from the ampulla.  Next, the clips are withdrawn from the peritoneal  cavity and the [facility] catheter is removed.  The cystic duct is triply clipped  and divided.  Cystic artery is dissected out,  doubly clipped, and divided.  Posterior branch of the cystic artery is dissected out, doubly clipped and  divided.  Gallbladder was excised from the gallbladder bed using the hook  electrocautery for hemostasis.  Prior to completely detaching the  gallbladder from the gallbladder bed, the hilum is again examined.  There  appears to be a small bile leak.  On closer inspection, it appears as though  one of the Ethicon endoclips have cut through the cystic duct, causing a  small bile leak.  The end of the cut cystic duct is then elevated with an  alligator clamp and a fourth clip is placed distally on the cystic duct in  order to occlude the laceration.  This appears to be completely occluding  with no further bile leak identified.  However, just to be on the safe side,  a 19-French drain is brought in and placed under the edge of the liver in  the gallbladder bed and exited through the lateral port stab wound on the  right abdominal wall.  It is secured to the skin with a 3-0 nylon stitch.  Good hemostasis is noted.  All ports are removed under direct vision and  good hemostasis is noted at port sites.  Pneumoperitoneum is released.  All  port sites are anesthetized with local anesthetic.  A 0 Vicryl is tied  securely at the umbilicus.  All wounds are closed with interrupted 4-0  Vicryl subcuticular sutures.  All wounds are washed and dried and Benzoin  and Steri-Strips are applied.  Sterile dressings are applied.  Drain is  placed to bulb suction.  The patient is awakened from anesthesia and brought  to the recovery room in stable condition.  The patient tolerated the  procedure well.   Dr. Leary Roca from gastroenterology will be consulted regarding possible  ERCP with stone extraction during this hospitalization.                                               Velora Heckler, M.D.    TMG/MEDQ  D:  03/01/2003  T:  03/01/2003  Job:  [id number]   cc:   Petra Kuba, M.D. [address]  [place]  [place] [address]  Fax: [phone]  Lyn Records III, M.D.  [address]  [place]  [place] [address]  Fax: [phone]

## 2010-07-21 ENCOUNTER — Encounter: Payer: Self-pay | Admitting: Family Medicine

## 2010-07-21 ENCOUNTER — Ambulatory Visit (INDEPENDENT_AMBULATORY_CARE_PROVIDER_SITE_OTHER): Payer: Medicare Other | Admitting: Family Medicine

## 2010-07-21 VITALS — BP 122/64 | Temp 98.5°F | Wt 167.0 lb

## 2010-07-21 DIAGNOSIS — E559 Vitamin D deficiency, unspecified: Secondary | ICD-10-CM

## 2010-07-21 DIAGNOSIS — M1990 Unspecified osteoarthritis, unspecified site: Secondary | ICD-10-CM

## 2010-07-21 DIAGNOSIS — I10 Essential (primary) hypertension: Secondary | ICD-10-CM

## 2010-07-21 DIAGNOSIS — D649 Anemia, unspecified: Secondary | ICD-10-CM

## 2010-07-21 DIAGNOSIS — R350 Frequency of micturition: Secondary | ICD-10-CM

## 2010-07-21 DIAGNOSIS — E785 Hyperlipidemia, unspecified: Secondary | ICD-10-CM

## 2010-07-21 DIAGNOSIS — E039 Hypothyroidism, unspecified: Secondary | ICD-10-CM

## 2010-07-21 DIAGNOSIS — M129 Arthropathy, unspecified: Secondary | ICD-10-CM

## 2010-07-21 DIAGNOSIS — I2510 Atherosclerotic heart disease of native coronary artery without angina pectoris: Secondary | ICD-10-CM

## 2010-07-21 LAB — BASIC METABOLIC PANEL
BUN: 19 mg/dL (ref 6–23)
CO2: 29 mEq/L (ref 19–32)
Chloride: 107 mEq/L (ref 96–112)
Creatinine, Ser: 0.7 mg/dL (ref 0.4–1.2)
Glucose, Bld: 106 mg/dL — ABNORMAL HIGH (ref 70–99)
Potassium: 3.9 mEq/L (ref 3.5–5.1)

## 2010-07-21 LAB — TSH: TSH: 0.02 u[IU]/mL — ABNORMAL LOW (ref 0.35–5.50)

## 2010-07-21 LAB — CBC WITH DIFFERENTIAL/PLATELET
Basophils Absolute: 0 10*3/uL (ref 0.0–0.1)
Eosinophils Absolute: 0.1 10*3/uL (ref 0.0–0.7)
Eosinophils Relative: 2.9 % (ref 0.0–5.0)
HCT: 40 % (ref 36.0–46.0)
Lymphs Abs: 2 10*3/uL (ref 0.7–4.0)
MCHC: 33.9 g/dL (ref 30.0–36.0)
MCV: 91.2 fl (ref 78.0–100.0)
Monocytes Absolute: 0.4 10*3/uL (ref 0.1–1.0)
Neutrophils Relative %: 44.1 % (ref 43.0–77.0)
Platelets: 224 10*3/uL (ref 150.0–400.0)
RDW: 14.5 % (ref 11.5–14.6)

## 2010-07-21 LAB — POCT URINALYSIS DIPSTICK
Blood, UA: NEGATIVE
Nitrite, UA: NEGATIVE
pH, UA: 5.5

## 2010-07-21 LAB — HEPATIC FUNCTION PANEL
AST: 24 U/L (ref 0–37)
Albumin: 4.4 g/dL (ref 3.5–5.2)
Alkaline Phosphatase: 116 U/L (ref 39–117)
Total Bilirubin: 1 mg/dL (ref 0.3–1.2)

## 2010-07-21 LAB — LIPID PANEL
HDL: 53.1 mg/dL (ref >39.00)
LDL Cholesterol: 59 mg/dL (ref 0–99)
VLDL: 30.2 mg/dL (ref 0.0–40.0)

## 2010-07-21 MED ORDER — PROPRANOLOL HCL 20 MG PO TABS: 20.0000 mg | ORAL_TABLET | Freq: Two times a day (BID) | ORAL | Status: DC

## 2010-07-21 MED ORDER — LEVOTHYROXINE SODIUM 200 MCG PO TABS: 200.0000 ug | ORAL_TABLET | Freq: Every day | ORAL | Status: DC

## 2010-07-21 MED ORDER — QUINAPRIL HCL 10 MG PO TABS: 10.0000 mg | ORAL_TABLET | Freq: Every day | ORAL | Status: DC

## 2010-07-21 MED ORDER — BENAZEPRIL HCL 20 MG PO TABS: 20.0000 mg | ORAL_TABLET | Freq: Every day | ORAL | Status: DC

## 2010-07-21 MED ORDER — HYDROCODONE-ACETAMINOPHEN 10-500 MG PO TABS: 1.0000 | ORAL_TABLET | ORAL | Status: AC | PRN

## 2010-07-21 MED ORDER — LOVASTATIN 40 MG PO TABS: ORAL_TABLET | ORAL | Status: DC

## 2010-07-21 MED ORDER — OMEPRAZOLE 40 MG PO CPDR: 40.0000 mg | DELAYED_RELEASE_CAPSULE | Freq: Every day | ORAL | Status: DC

## 2010-07-22 LAB — VITAMIN D 25 HYDROXY (VIT D DEFICIENCY, FRACTURES): Vit D, 25-Hydroxy: 20 ng/mL — ABNORMAL LOW (ref 30–89)

## 2010-07-23 ENCOUNTER — Other Ambulatory Visit: Payer: Self-pay | Admitting: Family Medicine

## 2010-07-23 MED ORDER — VITAMIN D3 1.25 MG (50000 UT) PO CAPS: 1.0000 | ORAL_CAPSULE | ORAL | Status: DC

## 2010-07-23 MED ORDER — LEVOTHYROXINE SODIUM 150 MCG PO TABS: 150.0000 ug | ORAL_TABLET | Freq: Every day | ORAL | Status: DC

## 2010-08-21 ENCOUNTER — Other Ambulatory Visit: Payer: Self-pay | Admitting: Family Medicine

## 2010-08-24 NOTE — Patient Instructions (Signed)
Increased diclofenac 75 mg twice daily Continue your other medications Decrease Synthroid to 150 mcg Keep warm with gynecologist for Pap and mammogram

## 2010-08-24 NOTE — Progress Notes (Signed)
  Subjective:    Patient ID: Alexis Banks, female    DOB: [birth year], 65 y.o.   MRN: [id number] This 65 year old white retired female is in today to follow up on hypothyroidism and primarily to be treated for her multiple joint arthritis where she is having considerable stiffness and pain both hips for painful as well as hand and elbow joint more so in the morning and improving through the  day. 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 Continues to be overweight at 167 No other symptoms on systems review HPI    Review of Systems see history of present illness     Objective:   Physical Exam the patient is a well-built well-nourished overweight white female who is in no distress f pleasant and cooperative HEENT neck Heart and lungs no positive finding No rash present on her right breast Extremities joints appear normal minimal tenderness over hip joints        Assessment & Plan:  Osteoarthritis multiple joints increased diclofenac 75 mg twice a day Hypertension continue quinapril medication Hypothyroidism increase her levothyroid 150 microgram daily Hyperlipidemia continue lovastatin 40 mg each day GERD continue Prilosec 4 mg daily

## 2010-09-10 ENCOUNTER — Other Ambulatory Visit: Payer: Self-pay | Admitting: Family Medicine

## 2010-09-10 NOTE — Telephone Encounter (Signed)
Pt is having trouble sleeping and is requesting to be contacted.

## 2010-09-10 NOTE — Telephone Encounter (Signed)
Pt c/o going to bed at 1am and waking up at 4:30am x 2 weeks. She states that when she opens her eyes at 4:30am and looks around it's like she's "in a different place and it's scary." Pt would like to try a sleep aid to see if that helps. Please advise

## 2010-09-14 MED ORDER — ZOLPIDEM TARTRATE 10 MG PO TABS: 10.0000 mg | ORAL_TABLET | Freq: Every evening | ORAL | Status: DC | PRN

## 2010-09-14 NOTE — Telephone Encounter (Signed)
Per Dr. Scotty Court rx called in to the pharmacy for Ambien 10 mg. Called to make pt aware.

## 2010-09-21 ENCOUNTER — Other Ambulatory Visit: Payer: Self-pay | Admitting: Family Medicine

## 2010-10-23 LAB — URINALYSIS, ROUTINE W REFLEX MICROSCOPIC
Glucose, UA: NEGATIVE mg/dL
Ketones, ur: NEGATIVE mg/dL
Nitrite: NEGATIVE
Specific Gravity, Urine: 1.02 (ref 1.005–1.030)
pH: 6.5 (ref 5.0–8.0)

## 2010-10-23 LAB — DIFFERENTIAL
Lymphocytes Relative: 35 % (ref 12–46)
Lymphs Abs: 1.8 10*3/uL (ref 0.7–4.0)
Monocytes Relative: 8 % (ref 3–12)
Neutro Abs: 2.7 10*3/uL (ref 1.7–7.7)
Neutrophils Relative %: 54 % (ref 43–77)

## 2010-10-23 LAB — COMPREHENSIVE METABOLIC PANEL
ALT: 22 U/L (ref 0–35)
CO2: 29 mEq/L (ref 19–32)
Calcium: 9.7 mg/dL (ref 8.4–10.5)
Creatinine, Ser: 0.75 mg/dL (ref 0.4–1.2)
GFR calc non Af Amer: >60 mL/min (ref >60)
Glucose, Bld: 106 mg/dL — ABNORMAL HIGH (ref 70–99)
Sodium: 142 mEq/L (ref 135–145)
Total Protein: 6.8 g/dL (ref 6.0–8.3)

## 2010-10-23 LAB — ABO/RH: ABO/RH(D): A POS

## 2010-10-23 LAB — CBC
Hemoglobin: 13.2 g/dL (ref 12.0–15.0)
MCHC: 33.5 g/dL (ref 30.0–36.0)
MCV: 90.9 fL (ref 78.0–100.0)
RBC: 4.35 MIL/uL (ref 3.87–5.11)
RDW: 14.4 % (ref 11.5–15.5)

## 2010-10-23 LAB — URINE CULTURE: Special Requests: NEGATIVE

## 2010-10-23 LAB — TYPE AND SCREEN: Antibody Screen: NEGATIVE

## 2010-10-23 LAB — PROTIME-INR
INR: 1.1 (ref 0.00–1.49)
Prothrombin Time: 14.1 seconds (ref 11.6–15.2)

## 2010-11-06 ENCOUNTER — Ambulatory Visit (INDEPENDENT_AMBULATORY_CARE_PROVIDER_SITE_OTHER): Payer: Medicare Other | Admitting: Family Medicine

## 2010-11-06 ENCOUNTER — Encounter: Payer: Self-pay | Admitting: Family Medicine

## 2010-11-06 VITALS — BP 120/84 | Temp 98.5°F | Wt 170.0 lb

## 2010-11-06 DIAGNOSIS — H1010 Acute atopic conjunctivitis, unspecified eye: Secondary | ICD-10-CM

## 2010-11-06 DIAGNOSIS — H1045 Other chronic allergic conjunctivitis: Secondary | ICD-10-CM

## 2010-11-06 MED ORDER — OLOPATADINE HCL 0.1 % OP SOLN: 1.0000 [drp] | Freq: Two times a day (BID) | OPHTHALMIC | Status: DC

## 2010-11-06 NOTE — Progress Notes (Signed)
  Subjective:    Patient ID: Alexis Banks, female    DOB: 08/05/1945, 65 y.o.   MRN: [id number]  HPI  Acute visit. Right eye symptoms started Monday 4 days ago. Watery and itchy. No purulent drainage. No blurred vision. No redness. Recently a couple of grandchildren had conjunctivitis.  No contact lens use. Symptoms actually somewhat better today. No left symptoms.  Review of Systems  Constitutional: Negative for fever and chills.  HENT: Negative for congestion.   Eyes: Positive for itching. Negative for photophobia, pain, redness and visual disturbance.  Respiratory: Negative for cough.        Objective:   Physical Exam  Constitutional: She appears well-developed and well-nourished.  HENT:  Right Ear: External ear normal.  Left Ear: External ear normal.  Mouth/Throat: Oropharynx is clear and moist.  Eyes: Conjunctivae are normal. Pupils are equal, round, and reactive to light. Right eye exhibits no discharge. Left eye exhibits no discharge.  Cardiovascular: Normal rate and regular rhythm.   Pulmonary/Chest: Effort normal and breath sounds normal. No respiratory distress. She has no wheezes.          Assessment & Plan:  Probable allergic conjunctivitis. No evidence for bacterial conjunctivitis at this time. Patanol eyedrops as needed.

## 2010-11-06 NOTE — Patient Instructions (Signed)
Allergic Rhinitis Allergic rhinitis is when the mucous membranes in the nose respond to allergens. Allergens are particles in the air that cause your body to have an allergic reaction. This causes you to release allergic antibodies. Through a chain of events, these eventually cause you to release histamine into the blood stream (hence the use of antihistamines). Although meant to be protective to the body, it is this release that causes your discomfort, such as frequent sneezing, congestion and an itchy runny nose.  CAUSES  The pollen allergens may come from grasses, trees, and weeds. This is seasonal allergic rhinitis, or "hay fever." Other allergens cause year-round allergic rhinitis (perennial allergic rhinitis) such as house dust mite allergen, pet dander and mold spores.  SYMPTOMS   Nasal stuffiness (congestion).   Runny, itchy nose with sneezing and tearing of the eyes.   There is often an itching of the mouth, eyes and ears.  It cannot be cured, but it can be controlled with medications. DIAGNOSIS  If you are unable to determine the offending allergen, skin or blood testing may find it. TREATMENT   Avoid the allergen.   Medications and allergy shots (immunotherapy) can help.   Hay fever may often be treated with antihistamines in pill or nasal spray forms. Antihistamines block the effects of histamine. There are over-the-counter medicines that may help with nasal congestion and swelling around the eyes. Check with your caregiver before taking or giving this medicine.  If the treatment above does not work, there are many new medications your caregiver can prescribe. Stronger medications may be used if initial measures are ineffective. Desensitizing injections can be used if medications and avoidance fails. Desensitization is when a patient is given ongoing shots until the body becomes less sensitive to the allergen. Make sure you follow up with your caregiver if problems continue. SEEK  MEDICAL CARE IF:   You develop fever (more than 100.5 F (38.1 C).   You develop a cough that does not stop easily (persistent).   You have shortness of breath.   You start wheezing.   Symptoms interfere with normal daily activities.  Document Released: 09/29/2000 Document Revised: 09/16/2010 Document Reviewed: 04/10/2008 [employer] Patient Information 2012 [employer]. 

## 2010-11-26 ENCOUNTER — Telehealth: Payer: Self-pay | Admitting: Family Medicine

## 2010-11-26 NOTE — Telephone Encounter (Signed)
Pt called and said that she came in to see Dr Caryl Never on 11/06/10 and had discussed est with Dr Caryl Never since Dr Scotty Court was out of office. Pt said that Dr Caryl Never told her, that he would accept her as a new patient. Pls advise.

## 2010-11-26 NOTE — Telephone Encounter (Signed)
I am OK with seeing her.   

## 2010-11-26 NOTE — Telephone Encounter (Signed)
Called pt and informed her that Dr Caryl Never has agreed to accept her a pt. Pt has been sch an est ov on 01/07/11 at 3pm.

## 2010-12-23 ENCOUNTER — Telehealth: Payer: Self-pay

## 2010-12-23 NOTE — Telephone Encounter (Signed)
Try hydrocortisone cream and if no improvement needs to be seen

## 2010-12-23 NOTE — Telephone Encounter (Signed)
Pt called and states she has "itching in her lower parts".Pt states she does not have a rash just itching and burning.   Pt denies changes in detergent. Pt states it does not hurt when she voids. Pt does see some irritation. Pt would like something to stop the itching.  Pt states she used a personal wipe and since she is so sensitive it may have irritated pt.  Pt instructed to go to pharmacy and get some anti-itch medication.  Pt states she does not want an appt at this time but if she gets worse she will call to make an appt.

## 2010-12-23 NOTE — Telephone Encounter (Signed)
Pt aware.

## 2010-12-28 ENCOUNTER — Other Ambulatory Visit (HOSPITAL_COMMUNITY): Payer: Self-pay | Admitting: Obstetrics and Gynecology

## 2010-12-28 ENCOUNTER — Ambulatory Visit (INDEPENDENT_AMBULATORY_CARE_PROVIDER_SITE_OTHER): Payer: Medicare Other | Admitting: Family Medicine

## 2010-12-28 ENCOUNTER — Encounter: Payer: Self-pay | Admitting: Family Medicine

## 2010-12-28 ENCOUNTER — Other Ambulatory Visit: Payer: Self-pay | Admitting: Obstetrics and Gynecology

## 2010-12-28 VITALS — BP 155/88 | Temp 98.5°F | Wt 172.0 lb

## 2010-12-28 DIAGNOSIS — N898 Other specified noninflammatory disorders of vagina: Secondary | ICD-10-CM

## 2010-12-28 DIAGNOSIS — L293 Anogenital pruritus, unspecified: Secondary | ICD-10-CM

## 2010-12-28 DIAGNOSIS — Z1231 Encounter for screening mammogram for malignant neoplasm of breast: Secondary | ICD-10-CM

## 2010-12-28 MED ORDER — CLOTRIMAZOLE-BETAMETHASONE 1-0.05 % EX LOTN: TOPICAL_LOTION | Freq: Two times a day (BID) | CUTANEOUS | Status: DC

## 2010-12-28 NOTE — Patient Instructions (Signed)
Avoid scratching area as much as possible.

## 2010-12-28 NOTE — Progress Notes (Signed)
  Subjective:    Patient ID: Alexis Banks, female    DOB: 27-Jun-1945, 65 y.o.   MRN: [id number]  HPI  Acute visit for vaginal itching. Onset about 5 days ago after using Pampers baby wipe. No vaginal discharge. Itching is mostly confined around the clitoris region. Tried vagiseal and Monistat without relief. No dysuria.  Does have history of what sounds like atrophic vaginitis. Has been inconsistent with use of topical estrogens. Symptoms of pain with intercourse occasionally. She sees a gynecologist regularly.   Review of Systems  Constitutional: Negative for fever.  Genitourinary: Negative for dysuria, frequency, hematuria and vaginal discharge.       Objective:   Physical Exam  Constitutional: She appears well-developed and well-nourished.  Cardiovascular: Normal rate and regular rhythm.   Pulmonary/Chest: Effort normal and breath sounds normal. No respiratory distress. She has no wheezes. She has no rales.  Skin:       Patient has slightly excoriated area just lateral to the left superior labia majora. No vaginal discharge noted.          Assessment & Plan:  Vaginal itching. Doubt Candida. Avoid scratching. Lotrisone cream twice daily

## 2011-01-01 ENCOUNTER — Telehealth: Payer: Self-pay

## 2011-01-01 NOTE — Telephone Encounter (Signed)
Pt states she was in to see Dr. Caryl Never this week and pt is still having a problem in her vagina are.  Pt has used the medication but she has not gotten better. Pls call pt back.

## 2011-01-01 NOTE — Telephone Encounter (Signed)
Left message to call back  

## 2011-01-04 ENCOUNTER — Telehealth: Payer: Self-pay

## 2011-01-04 NOTE — Telephone Encounter (Signed)
Pt aware of this. 

## 2011-01-04 NOTE — Telephone Encounter (Signed)
Left a message for pt to return call 

## 2011-01-04 NOTE — Telephone Encounter (Signed)
Pt states she was seen and given Lotrisone cream to use for her itching.  Per pt the itching is still there although it is not as bad.  Pt states she is not better and would like to know if she needs to be taking another medication.  Pls advise.

## 2011-01-04 NOTE — Telephone Encounter (Signed)
This was only prescribed one week ago.  Continue Lotrisone cream for another week

## 2011-01-05 NOTE — Telephone Encounter (Signed)
Left message to call back  

## 2011-01-07 ENCOUNTER — Ambulatory Visit: Payer: Medicare Other | Admitting: Family Medicine

## 2011-01-08 ENCOUNTER — Encounter: Payer: Self-pay | Admitting: Family Medicine

## 2011-01-08 ENCOUNTER — Ambulatory Visit (INDEPENDENT_AMBULATORY_CARE_PROVIDER_SITE_OTHER): Payer: Medicare Other | Admitting: Family Medicine

## 2011-01-08 DIAGNOSIS — R0602 Shortness of breath: Secondary | ICD-10-CM

## 2011-01-08 DIAGNOSIS — E039 Hypothyroidism, unspecified: Secondary | ICD-10-CM

## 2011-01-08 DIAGNOSIS — R002 Palpitations: Secondary | ICD-10-CM

## 2011-01-08 DIAGNOSIS — I10 Essential (primary) hypertension: Secondary | ICD-10-CM

## 2011-01-08 LAB — TSH: TSH: 0.92 u[IU]/mL (ref 0.35–5.50)

## 2011-01-08 NOTE — Progress Notes (Signed)
  Subjective:    Patient ID: Alexis Banks, female    DOB: 1945/09/14, 65 y.o.   MRN: [id number]  HPI  Patient recently transferred care to me. She is here today with palpitations off and on for several days. In reviewing her record she has hypothyroidism. Back in July she had over placed thyroid and per telephone instructions indicated to reduce Synthroid to 75 mcg daily but her medication listed is 150 mcg daily. She thinks this is the current dosage. She does have occasional diarrhea but had previous cholecystectomy. No weight loss. Denies any chest pain. No clear exacerbating features. Minimal caffeine use.  Hypertension. In reviewing records she has been maintained on 2 ACE inhibitors including benazepril and quinapril. We need to discontinue one of these. No orthostasis.  Also on Inderal 20 mg bid.  Past Medical History  Diagnosis Date  . Hyperlipidemia   . Hypertension   . Thyroid disease     hypothyroidism   No past surgical history on file.  reports that she has never smoked. She has never used smokeless tobacco. She reports that she does not drink alcohol or use illicit drugs. family history is not on file. No Known Allergies    Review of Systems  Constitutional: Negative for fever, appetite change and unexpected weight change.  Respiratory: Negative for cough and shortness of breath.   Cardiovascular: Positive for palpitations. Negative for chest pain and leg swelling.  Genitourinary: Negative for dysuria.  Neurological: Negative for dizziness, seizures and syncope.       Objective:   Physical Exam  Constitutional: She is oriented to person, place, and time. She appears well-developed and well-nourished. No distress.  Neck: Neck supple. No thyromegaly present.  Cardiovascular: Normal rate, regular rhythm and normal heart sounds.  Exam reveals no gallop.   Pulmonary/Chest: Effort normal and breath sounds normal. No respiratory distress. She has no wheezes. She has no  rales.  Musculoskeletal: She exhibits no edema.  Neurological: She is oriented to person, place, and time.          Assessment & Plan:  #1 palpitations. Possibly related to over replaced thyroid. Recheck TSH. We'll likely need to discontinue or greatly reduce her thyroid dosage-if not already done. She will confirm her home dose. EKG shows occasional PVC. Nonspecific T wave abnormality diffusely. #2 hypothyroidism. As above possibly over replaced. Recheck TSH  #3 hypertension.  On 2 separate ACE inhibitor's. We'll discontinue Accupril

## 2011-01-08 NOTE — Patient Instructions (Signed)
Hold Quinapril.

## 2011-01-11 NOTE — Progress Notes (Signed)
Quick Note:  Pt informed, she has been taking 150 mcg Synthroid. Dr Caryl Never informed, so she is to continue with 150 mcg daily ______

## 2011-01-13 ENCOUNTER — Ambulatory Visit
Admission: RE | Admit: 2011-01-13 | Discharge: 2011-01-13 | Disposition: A | Discharge status: Home / Self Care | Payer: Medicare Other | Source: Ambulatory Visit | Attending: Obstetrics and Gynecology | Admitting: Obstetrics and Gynecology

## 2011-01-13 DIAGNOSIS — Z1231 Encounter for screening mammogram for malignant neoplasm of breast: Secondary | ICD-10-CM

## 2011-01-15 NOTE — Telephone Encounter (Signed)
Pt never called back.

## 2011-01-18 ENCOUNTER — Encounter: Payer: Self-pay | Admitting: Family Medicine

## 2011-01-18 ENCOUNTER — Ambulatory Visit (INDEPENDENT_AMBULATORY_CARE_PROVIDER_SITE_OTHER): Payer: Medicare Other | Admitting: Family Medicine

## 2011-01-18 VITALS — BP 120/80 | Temp 98.6°F | Wt 169.0 lb

## 2011-01-18 DIAGNOSIS — I10 Essential (primary) hypertension: Secondary | ICD-10-CM

## 2011-01-18 DIAGNOSIS — R579 Shock, unspecified: Secondary | ICD-10-CM
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
## 2011-02-04 ENCOUNTER — Ambulatory Visit: Payer: Medicare Other | Admitting: Family Medicine

## 2011-03-23 ENCOUNTER — Ambulatory Visit: Payer: Medicare Other | Admitting: Cardiology

## 2011-03-25 ENCOUNTER — Other Ambulatory Visit: Payer: Self-pay | Admitting: Family Medicine

## 2011-03-25 ENCOUNTER — Encounter: Payer: Self-pay | Admitting: *Deleted

## 2011-03-25 ENCOUNTER — Ambulatory Visit (INDEPENDENT_AMBULATORY_CARE_PROVIDER_SITE_OTHER): Payer: Medicare Other | Admitting: Cardiology

## 2011-03-25 VITALS — BP 144/86 | HR 65 | Ht 65.0 in | Wt 170.0 lb

## 2011-03-25 DIAGNOSIS — I2510 Atherosclerotic heart disease of native coronary artery without angina pectoris: Secondary | ICD-10-CM

## 2011-03-25 DIAGNOSIS — I10 Essential (primary) hypertension: Secondary | ICD-10-CM

## 2011-03-25 DIAGNOSIS — E785 Hyperlipidemia, unspecified: Secondary | ICD-10-CM

## 2011-03-25 NOTE — Patient Instructions (Signed)
Your physician wants you to follow-up in: ONE YEAR You will receive a reminder letter in the mail two months in advance. If you don't receive a letter, please call our office to schedule the follow-up appointment.   Your physician has requested that you have en exercise stress myoview. For further information please visit [email or web address]. Please follow instruction sheet, as given.   

## 2011-03-25 NOTE — Assessment & Plan Note (Signed)
Continue aspirin and statin. Arrange Myoview on beta-blockade for risk stratification.

## 2011-03-25 NOTE — Assessment & Plan Note (Signed)
Continue statin. Lipids and liver monitored by primary care. 

## 2011-03-25 NOTE — Assessment & Plan Note (Signed)
Blood pressure controlled. Continue present medications. Potassium and renal function monitored by primary care. 

## 2011-03-25 NOTE — Progress Notes (Signed)
HPI: 66 year old female with past medical history of coronary artery disease for further evaluation. The patient presented with chest pain and abnormal enzymes in 2004. She underwent cardiac catheterization and was felt to have a 40-50 left circumflex with superimposed hazy area possibly secondary to plaque rupture. There was an anterior lateral wall motion abnormality. Ejection fraction 50%. Myoview in August of 2009 showed an ejection fraction of 49%. There was no ischemia or infarction. The patient present for evaluation of her coronary disease. She has dyspnea with more extreme activities but not routine activities. There is no orthopnea, PND, pedal edema, syncope or exertional chest pain.  Current Outpatient Prescriptions  Medication Sig Dispense Refill  . aspirin 81 MG tablet Take 81 mg by mouth daily.        . benazepril (LOTENSIN) 20 MG tablet Take 1 tablet (20 mg total) by mouth daily.  90 tablet  3  . Calcium-Vitamin D (CALTRATE 600 PLUS-VIT D PO) Take by mouth daily.        . Coenzyme Q10 (COQ10) 150 MG CAPS Take 150 mg by mouth daily.        Marland Kitchen levothyroxine (SYNTHROID) 150 MCG tablet Take 1 tablet (150 mcg total) by mouth daily.  90 tablet  11  . lovastatin (MEVACOR) 40 MG tablet 1 tab hs for hyperlipidemia  90 tablet  3  . Multiple Vitamin (MULTIVITAMIN) tablet Take 1 tablet by mouth daily.        . OMEGA 3-6-9 FATTY ACIDS PO Take by mouth daily.        Marland Kitchen omeprazole (PRILOSEC) 40 MG capsule Take 1 capsule (40 mg total) by mouth daily.  90 capsule  3  . propranolol (INDERAL) 20 MG tablet TAKE 1 TABLET BY MOUTH TWICE DAILY FOR HEART  180 tablet  0    No Known Allergies  Past Medical History  Diagnosis Date  . Hyperlipidemia   . Hypertension   . CORONARY ARTERY DISEASE   . VARICOSE VEINS, LOWER EXTREMITIES   . HYPOTHYROIDISM   . VITAMIN D DEFICIENCY   . ANXIETY DEPRESSION   . GERD   . ROTATOR CUFF SYNDROME, RIGHT   . INSOMNIA   . Trochanteric bursitis of left hip     Past  Surgical History  Procedure Date  . Cholecystectomy   . Rotator cuff surgery     History   Social History  . Marital Status: Married    Spouse Name: N/A    Number of Children: 3  . Years of Education: N/A   Occupational History  .      Retired   Social History Main Topics  . Smoking status: Never Smoker   . Smokeless tobacco: Never Used  . Alcohol Use: No  . Drug Use: No  . Sexually Active: Not on file   Other Topics Concern  . Not on file   Social History Narrative  . No narrative on file    Family History  Problem Relation Age of Onset  . Heart disease      No family history of  . COPD Father   . Alcohol abuse Mother     ROS: Problems with insomnia but no fevers or chills, productive cough, hemoptysis, dysphasia, odynophagia, melena, hematochezia, dysuria, hematuria, rash, seizure activity, orthopnea, PND, pedal edema, claudication. Remaining systems are negative.  Physical Exam:   Blood pressure 144/86, pulse 65, height 5\' 5"  (1.651 m), weight 170 lb (77.111 kg).  General:  Well developed/well nourished in NAD Skin warm/dry  Patient not depressed No peripheral clubbing Back-normal HEENT-normal/normal eyelids Neck supple/normal carotid upstroke bilaterally; no bruits; no JVD; no thyromegaly chest - CTA/ normal expansion CV - RRR/normal S1 and S2; no murmurs, rubs or gallops;  PMI nondisplaced Abdomen -NT/ND, no HSM, no mass, + bowel sounds, no bruit 2+ femoral pulses, no bruits Ext-no edema, chords, 2+ DP Neuro-grossly nonfocal  ECG normal sinus rhythm at a rate of 65. First degree AV block. Left axis deviation. Left injected hypertrophy. Prior septal infarct. Inferior lateral T-wave changes.

## 2011-03-30 ENCOUNTER — Telehealth: Payer: Self-pay | Admitting: Family Medicine

## 2011-03-30 MED ORDER — ZOLPIDEM TARTRATE 10 MG PO TABS: 10.0000 mg | ORAL_TABLET | Freq: Every evening | ORAL | Status: DC | PRN

## 2011-03-30 NOTE — Telephone Encounter (Signed)
Pt requesting refill on zolpidem (AMBIEN) 10 MG tablet   [facility]

## 2011-03-30 NOTE — Telephone Encounter (Signed)
Last filed 09-13-10, #5 with 5 refills Last OV 01-18-11

## 2011-03-30 NOTE — Telephone Encounter (Signed)
Refill for 6 months. 

## 2011-04-04 ENCOUNTER — Ambulatory Visit (INDEPENDENT_AMBULATORY_CARE_PROVIDER_SITE_OTHER): Payer: Medicare Other | Admitting: Family Medicine

## 2011-04-04 VITALS — BP 162/94 | HR 59 | Temp 97.7°F | Resp 18 | Ht 61.0 in | Wt 169.0 lb

## 2011-04-04 DIAGNOSIS — M545 Low back pain: Secondary | ICD-10-CM

## 2011-04-04 MED ORDER — TRAMADOL HCL 50 MG PO TABS: 50.0000 mg | ORAL_TABLET | Freq: Three times a day (TID) | ORAL | Status: AC | PRN

## 2011-04-04 MED ORDER — CYCLOBENZAPRINE HCL 10 MG PO TABS: 10.0000 mg | ORAL_TABLET | Freq: Two times a day (BID) | ORAL | Status: AC | PRN

## 2011-04-04 NOTE — Progress Notes (Signed)
  Patient Name: Alexis Banks Date of Birth: 07-14-1945 Medical Record Number: [id number] Gender: female Date of Encounter: 04/04/2011  History of Present Illness:  Alexis Banks is a 66 y.o. very pleasant female patient who presents with the following:  She was twisting to put clothes in the dryer today and her back went out- she felt a pop.  She rested but her left hip/ lower back is very sore.  This has never happened to her before. The pain does not shoot down her leg, and she notes no numbness, weakness or incontinence.    She will need to babysit her 23 month old grandaughter tomorrow  Patient Active Problem List  Diagnoses  . HYPOTHYROIDISM  . VITAMIN D DEFICIENCY  . HYPERLIPIDEMIA  . HYPOKALEMIA  . ANXIETY DEPRESSION  . HYPERTENSION  . CORONARY ARTERY DISEASE  . VARICOSE VEINS, LOWER EXTREMITIES  . GERD  . ROTATOR CUFF SYNDROME, RIGHT  . LEG CRAMPS  . INSOMNIA  . Trochanteric bursitis of left hip   Past Medical History  Diagnosis Date  . Hyperlipidemia   . Hypertension   . CORONARY ARTERY DISEASE   . VARICOSE VEINS, LOWER EXTREMITIES   . HYPOTHYROIDISM   . VITAMIN D DEFICIENCY   . ANXIETY DEPRESSION   . GERD   . ROTATOR CUFF SYNDROME, RIGHT   . INSOMNIA   . Trochanteric bursitis of left hip    Past Surgical History  Procedure Date  . Cholecystectomy   . Rotator cuff surgery    History  Substance Use Topics  . Smoking status: Never Smoker   . Smokeless tobacco: Never Used  . Alcohol Use: No   Family History  Problem Relation Age of Onset  . Heart disease      No family history of  . COPD Father   . Alcohol abuse Mother    No Known Allergies  Medication list has been reviewed and updated.  Review of Systems: As per HPI- otherwise negative.  Physical Examination: Filed Vitals:   04/04/11 1707  BP: 162/94  Pulse: 59  Temp: 97.7 F (36.5 C)  TempSrc: Oral  Resp: 18  Height: 5\' 1"  (1.549 m)  Weight: 169 lb (76.658 kg)    Body mass index  is 31.93 kg/(m^2).  GEN: WDWN, NAD, Non-toxic, A & O x 3, overweight HEENT: Atraumatic, Normocephalic. Neck supple. No masses, No LAD. Ears and Nose: No external deformity. CV: RRR, No M/G/R. No JVD. No thrill. No extra heart sounds. PULM: CTA B, no wheezes, crackles, rhonchi. No retractions. No resp. distress. No accessory muscle use. ABD: S, NT, ND, +BS. No rebound. No HSM. EXTR: No c/c/e NEURO Normal gait.  PSYCH: Normally interactive. Conversant. Not depressed or anxious appearing.  Calm demeanor.  Left lower back:  Tender area at left SI joint.  Normal flexion and extension, straight leg raise negative, normal sensation and normal patellar DTR bilaterally, no saddle anesthesia   Assessment and Plan: 1. Lower back pain  cyclobenzaprine (FLEXERIL) 10 MG tablet, traMADol (ULTRAM) 50 MG tablet   Treat as above for a back strain/ SI joint strain.  Patient (or parent if minor) instructed to return to clinic or call if not better in 2-3 day(s). Sooner if worse.

## 2011-04-08 ENCOUNTER — Telehealth: Payer: Self-pay

## 2011-04-08 NOTE — Telephone Encounter (Signed)
Per Dr. Cyndie Chime note, pt should RTC if no improvement 2-3 days

## 2011-04-08 NOTE — Telephone Encounter (Signed)
Patient was in 04/04/11 and states pulled muscle and was given Flexerill and Tramadol. She is taking Flexerill bid but had to stop taking Tramadol because made her "loopy". She has been using heating pad and still having pain in mid back. The pain has gotten a little better but not a whole lot. Wants to know if she needs to give it more time or is there something else she can try or do. Please advise

## 2011-04-08 NOTE — Telephone Encounter (Signed)
PT IS NO BETTER, HAS A LITTLE MORE MOBILITY IN HER SHOULDER, WOULD LIKE A CALL BACK [phone]

## 2011-04-09 NOTE — Telephone Encounter (Signed)
Pt notified and will try to give it some more time before coming back for Recheck.

## 2011-04-20 ENCOUNTER — Ambulatory Visit (HOSPITAL_COMMUNITY): Payer: Medicare Other | Attending: Cardiology | Admitting: Radiology

## 2011-04-20 VITALS — BP 149/98 | Ht 62.0 in | Wt 164.0 lb

## 2011-04-20 DIAGNOSIS — R9431 Abnormal electrocardiogram [ECG] [EKG]: Secondary | ICD-10-CM

## 2011-04-20 DIAGNOSIS — R0609 Other forms of dyspnea: Secondary | ICD-10-CM | POA: Insufficient documentation

## 2011-04-20 DIAGNOSIS — I2510 Atherosclerotic heart disease of native coronary artery without angina pectoris: Secondary | ICD-10-CM

## 2011-04-20 DIAGNOSIS — E785 Hyperlipidemia, unspecified: Secondary | ICD-10-CM | POA: Insufficient documentation

## 2011-04-20 DIAGNOSIS — R002 Palpitations: Secondary | ICD-10-CM | POA: Insufficient documentation

## 2011-04-20 DIAGNOSIS — I10 Essential (primary) hypertension: Secondary | ICD-10-CM | POA: Insufficient documentation

## 2011-04-20 DIAGNOSIS — R0602 Shortness of breath: Secondary | ICD-10-CM

## 2011-04-20 DIAGNOSIS — R0989 Other specified symptoms and signs involving the circulatory and respiratory systems: Secondary | ICD-10-CM | POA: Insufficient documentation

## 2011-04-20 MED ORDER — TECHNETIUM TC 99M TETROFOSMIN IV KIT
10.0000 | PACK | Freq: Once | INTRAVENOUS | Status: AC | PRN
  Administered 2011-04-20: 10 via INTRAVENOUS

## 2011-04-20 MED ORDER — TECHNETIUM TC 99M TETROFOSMIN IV KIT
30.0000 | PACK | Freq: Once | INTRAVENOUS | Status: AC | PRN
  Administered 2011-04-20: 30 via INTRAVENOUS

## 2011-04-20 NOTE — Progress Notes (Signed)
[facility] 3 NUCLEAR MED [address] [place] [address] [phone]  Cardiology Nuclear Med Study  Alexis Banks is a 66 y.o. female     MRN : [id number]     DOB: 1945-09-01  Procedure Date: 04/20/2011  Nuclear Med Background Indication for Stress Test:  Evaluation for Ischemia History: 2004Heart Cath EF: 50% 40-50% CFX with superimposed hazy area, secondary to plaque rupture. LAD/RCA NL, 08/2007 MPS: EF: 49% (-) [facility]scar [facility]iac Risk Factors: Hypertension and Lipids  Symptoms:  DOE and Palpitations   Nuclear Pre-Procedure Caffeine/Decaff Intake:  7:00pm NPO After: 7:00pm   Lungs:  clear O2 Sat: 98% on room air. IV 0.9% NS with Angio Cath:  22g  IV Site: R Antecubital x 1, tolerated well IV Started by:  Irean Hong, RN  Chest Size (in):  38 Cup Size: C  Height: 5\' 2"  (1.575 m)  Weight:  164 lb (74.39 kg)  BMI:  Body mass index is 30.00 kg/(m^2). Tech Comments:  Held propranolol x 24 hrs    Nuclear Med Study 1 or 2 day study: 1 day  Stress Test Type:  Stress  Reading MD: Cassell Clement, MD  Order Authorizing Provider:  Olga Millers, MD  Resting Radionuclide: Technetium 33m Tetrofosmin  Resting Radionuclide Dose: 11.0 mCi   Stress Radionuclide:  Technetium 65m Tetrofosmin  Stress Radionuclide Dose: 33.0 mCi           Stress Protocol Rest HR: 98 Stress HR: 166  Rest BP: 149/98 Stress BP: 191/110  Exercise Time (min): 4:00 METS: 4.6   Predicted Max HR: 153 bpm % Max HR: 108.5 bpm Rate Pressure Product: 21308   Dose of Adenosine (mg):  n/a Dose of Lexiscan: n/a mg  Dose of Atropine (mg): n/a Dose of Dobutamine: n/a mcg/kg/min (at max HR)  Stress Test Technologist: Milana Na, EMT-P  Nuclear Technologist:  Domenic Polite, CNMT     Rest Procedure:  Myocardial perfusion imaging was performed at rest 45 minutes following the intravenous administration of Technetium 52m Tetrofosmin. Rest ECG: Stach with  pvcs/pacs  Stress Procedure:  The patient performed treadmill exercise using a Bruce  Protocol for 4:00 minutes. The patient stopped due to fatigue and denied any chest pain.  There were non specific ST-T wave changes and occ pvcs/pacs.  Technetium 38m Tetrofosmin was injected at peak exercise and myocardial perfusion imaging was performed after a brief delay. Stress ECG: No significant change from baseline ECG  QPS Raw Data Images:  Normal; no motion artifact; normal heart/lung ratio. Stress Images:  Normal homogeneous uptake in all areas of the myocardium. Rest Images:  Normal homogeneous uptake in all areas of the myocardium. Subtraction (SDS):  No evidence of ischemia. Transient Ischemic Dilatation (Normal <1.22):  1.07 Lung/Heart Ratio (Normal <0.45):  0.22  Quantitative Gated Spect Images QGS EDV:  67 ml QGS ESV:  29 ml  Impression Exercise Capacity:  Fair exercise capacity. BP Response:  Normal blood pressure response. Clinical Symptoms:  No chest pain. ECG Impression: Nonspecific ST-T changes at rest improve with stress and return during recovery.  No ischemic ST changes. Comparison with Prior Nuclear Study: No significant change from previous study  Overall Impression:  Normal stress nuclear study.  LV Ejection Fraction: 56%.  LV Wall Motion:  NL LV Function; NL Wall Motion  [employer]

## 2011-04-22 ENCOUNTER — Telehealth: Payer: Self-pay | Admitting: Cardiology

## 2011-04-22 MED ORDER — NITROGLYCERIN 0.4 MG SL SUBL: 0.4000 mg | SUBLINGUAL_TABLET | SUBLINGUAL | Status: DC | PRN

## 2011-04-22 NOTE — Telephone Encounter (Signed)
Refill on ntg given

## 2011-04-22 NOTE — Telephone Encounter (Signed)
Please return call to patient at [phone]  Patient has question about nitro pills, please advise if she should continue meds and if so she will need another prescription.

## 2011-06-30 ENCOUNTER — Ambulatory Visit (INDEPENDENT_AMBULATORY_CARE_PROVIDER_SITE_OTHER): Payer: Medicare Other | Admitting: Emergency Medicine

## 2011-06-30 VITALS — BP 134/76 | HR 73 | Temp 98.1°F | Resp 16 | Ht 62.0 in | Wt 168.0 lb

## 2011-06-30 DIAGNOSIS — H1130 Conjunctival hemorrhage, unspecified eye: Secondary | ICD-10-CM

## 2011-06-30 DIAGNOSIS — J309 Allergic rhinitis, unspecified: Secondary | ICD-10-CM

## 2011-06-30 NOTE — Patient Instructions (Addendum)
Hemorragia Subconjuntival (Subconjunctival Hemorrhage) Su examen muestra que tiene una hemorragia subconjuntival. Es una acumulacin de sangre que cubre una parte de la zona blanca del ojo, y que no es de Quarry manager. Este trastorno puede deberse a una lesin o esfuerzo (como al levantar peso, Engineering geologist, o toser); a menudo no se Teacher, adult education. La hemorragia subconjuntival no causa dolor ni problemas de visin. Este trastorno no requiere Banker. Llevar de 1 a 2 semanas para que la sangre se disuelva. Si toma aspirina o coumadina a diario o si tiene alta presin arterial, deber consultar con su mdico acerca de la necesidad de Building services engineer. Comunquese con su mdico si tiene problemas de visin, dolor alrededor del ojo, u otra consulta acerca de su trastorno. Document Released: 11/01/2006 Document Revised: 12/24/2010 [facility] [place] [facility] Patient Information 2012 [place], [place].

## 2011-06-30 NOTE — Progress Notes (Signed)
  Subjective:    Patient ID: Alexis Banks, female    DOB: [birth year], 66 y.o.   MRN: [id number]  Eye Injury  There is pain in the right eye. This is a new problem. The current episode started today. The problem occurs constantly. The problem has been unchanged. There was no injury mechanism. The pain is at a severity of 1/10. The pain is mild. There is no known exposure to pink eye. She does not wear contacts. Pertinent negatives include no blurred vision, eye discharge, double vision, eye redness, fever, foreign body sensation, itching, nausea, photophobia, recent URI or vomiting. She has tried nothing for the symptoms.      Review of Systems  Constitutional: Negative.  Negative for fever.  HENT: Negative.   Eyes: Negative for blurred vision, double vision, photophobia, discharge and redness.  Respiratory: Negative.   Cardiovascular: Negative.   Gastrointestinal: Negative.  Negative for nausea and vomiting.  Musculoskeletal: Negative.   Skin: Negative for itching.  Neurological: Negative.        Objective:   Physical Exam  Constitutional: She is oriented to person, place, and time. She appears well-developed and well-nourished.  HENT:  Head: Normocephalic and atraumatic.  Right Ear: External ear normal.  Left Ear: External ear normal.  Eyes: EOM are normal. Pupils are equal, round, and reactive to light. Right eye exhibits no discharge and no exudate. No foreign body present in the right eye. Right conjunctiva has a hemorrhage. Right eye exhibits normal extraocular motion and no nystagmus.  Cardiovascular: Normal rate and regular rhythm.   Pulmonary/Chest: Effort normal.  Abdominal: Soft.  Musculoskeletal: Normal range of motion.  Neurological: She is alert and oriented to person, place, and time.  Skin: Skin is warm and dry.          Assessment & Plan:  Patient will take OTC zyrtec for her allergic rhinitis and RTC prn for worsening of eye complaint

## 2011-07-15 ENCOUNTER — Ambulatory Visit (INDEPENDENT_AMBULATORY_CARE_PROVIDER_SITE_OTHER): Payer: Medicare Other | Admitting: Physician Assistant

## 2011-07-15 ENCOUNTER — Telehealth: Payer: Self-pay | Admitting: Family Medicine

## 2011-07-15 VITALS — BP 123/76 | HR 58 | Temp 97.6°F | Resp 16 | Ht 61.75 in | Wt 168.0 lb

## 2011-07-15 DIAGNOSIS — N390 Urinary tract infection, site not specified: Secondary | ICD-10-CM

## 2011-07-15 DIAGNOSIS — R300 Dysuria: Secondary | ICD-10-CM

## 2011-07-15 LAB — POCT UA - MICROSCOPIC ONLY
Casts, Ur, LPF, POC: NEGATIVE
Crystals, Ur, HPF, POC: NEGATIVE
Mucus, UA: NEGATIVE
Yeast, UA: NEGATIVE

## 2011-07-15 LAB — POCT URINALYSIS DIPSTICK
Protein, UA: 100
Spec Grav, UA: 1.02
Urobilinogen, UA: 2
pH, UA: 6

## 2011-07-15 MED ORDER — CIPROFLOXACIN HCL 500 MG PO TABS: 500.0000 mg | ORAL_TABLET | Freq: Two times a day (BID) | ORAL | Status: AC

## 2011-07-15 NOTE — Progress Notes (Signed)
  Subjective:    Patient ID: Alexis Banks, female    DOB: 1945-05-23, 66 y.o.   MRN: [id number]  HPI 66 yr old CF presents with acute onset of urinary frequency and pressure with urination taht started today while doing yardwork.  Up until today, she has felt completely normal.  She has not had f/c or back/flank pain.  Considering PCP change.   Review of Systems  All other systems reviewed and are negative.       Objective:   Physical Exam  Nursing note and vitals reviewed. Constitutional: She is oriented to person, place, and time. She appears well-developed and well-nourished.  HENT:  Head: Normocephalic and atraumatic.  Cardiovascular: Normal rate, regular rhythm and normal heart sounds.   Pulmonary/Chest: Effort normal and breath sounds normal.  Abdominal: There is no tenderness.  Neurological: She is alert and oriented to person, place, and time.     Results for orders placed in visit on 07/15/11  POCT URINALYSIS DIPSTICK      Component Value Range   Color, UA orange     Clarity, UA cloudy     Glucose, UA neg     Bilirubin, UA neg     Ketones, UA trace     Spec Grav, UA 1.020     Blood, UA trace     pH, UA 6.0     Protein, UA 100     Urobilinogen, UA 2.0     Nitrite, UA positive     Leukocytes, UA small (1+)    POCT UA - MICROSCOPIC ONLY      Component Value Range   WBC, Ur, HPF, POC TNTC     RBC, urine, microscopic 1-3     Bacteria, U Microscopic small     Mucus, UA neg     Epithelial cells, urine per micros 2-4     Crystals, Ur, HPF, POC neg     Casts, Ur, LPF, POC neg     Yeast, UA neg          Assessment & Plan:  UTI-send culture.  Start cipro.  Increase water intake. Ok to make appointments with me.

## 2011-07-15 NOTE — Telephone Encounter (Signed)
[name]; PCP: Evelena Peat; CB#: [phone]; ; ; Call regarding Urinary Pain/Bleeding; Onset this  morning 07/15/11.  +urgency, +burning and pressure. No blood. Afebrile. Last UOP- 10:15 a.m.  Marland Kitchen She is able to urinate.  +eating and drinking normally. She is out of town with her son . He is having a Cardiac Catherization today. She is out of town until Tuesday 07/20/11.  She is requesting an antibotic to be called in for her. [facility] [address]. Please call her on Cell and advise if Rx can be called in  [phone]. Emergent and signs reviewed per Urinary Symptoms Protocol with exception "Has one or more urinary tract symtpoms and has not been previously evaluated.  See in 24 hours. Home Care reviewed and   Advised I would send message to the office regarding her request.    Advised Tylenol 650mg  every 4-6 hours for discomfort.  Message to office .

## 2011-07-15 NOTE — Telephone Encounter (Signed)
Pt informed, she was not real happy, but agreed to seek care at an urgent care

## 2011-07-15 NOTE — Telephone Encounter (Signed)
Really needs to be seen.

## 2011-07-18 LAB — URINE CULTURE: Colony Count: NO GROWTH

## 2011-07-21 ENCOUNTER — Other Ambulatory Visit: Payer: Self-pay | Admitting: Family Medicine

## 2011-07-23 ENCOUNTER — Other Ambulatory Visit: Payer: Self-pay | Admitting: Family Medicine

## 2011-07-26 NOTE — Telephone Encounter (Signed)
Pt is out of med

## 2011-07-27 ENCOUNTER — Other Ambulatory Visit: Payer: Self-pay | Admitting: *Deleted

## 2011-07-27 MED ORDER — BENAZEPRIL HCL 20 MG PO TABS: 20.0000 mg | ORAL_TABLET | Freq: Every day | ORAL | Status: DC

## 2011-08-04 ENCOUNTER — Other Ambulatory Visit: Payer: Self-pay | Admitting: Family Medicine

## 2011-08-09 ENCOUNTER — Ambulatory Visit (INDEPENDENT_AMBULATORY_CARE_PROVIDER_SITE_OTHER): Payer: Medicare Other | Admitting: Family Medicine

## 2011-08-09 ENCOUNTER — Encounter: Payer: Self-pay | Admitting: Family Medicine

## 2011-08-09 VITALS — BP 142/80 | HR 72 | Temp 99.0°F | Resp 12 | Ht 61.25 in | Wt 172.0 lb

## 2011-08-09 DIAGNOSIS — Z0000 Encounter for general adult medical examination without abnormal findings: Secondary | ICD-10-CM

## 2011-08-09 DIAGNOSIS — G4700 Insomnia, unspecified: Secondary | ICD-10-CM

## 2011-08-09 DIAGNOSIS — I10 Essential (primary) hypertension: Secondary | ICD-10-CM

## 2011-08-09 DIAGNOSIS — Z23 Encounter for immunization: Secondary | ICD-10-CM

## 2011-08-09 DIAGNOSIS — E039 Hypothyroidism, unspecified: Secondary | ICD-10-CM

## 2011-08-09 DIAGNOSIS — E785 Hyperlipidemia, unspecified: Secondary | ICD-10-CM

## 2011-08-09 DIAGNOSIS — E559 Vitamin D deficiency, unspecified: Secondary | ICD-10-CM

## 2011-08-09 DIAGNOSIS — I2510 Atherosclerotic heart disease of native coronary artery without angina pectoris: Secondary | ICD-10-CM

## 2011-08-09 LAB — BASIC METABOLIC PANEL
BUN: 17 mg/dL (ref 6–23)
CO2: 25 mEq/L (ref 19–32)
Calcium: 9.5 mg/dL (ref 8.4–10.5)
Creatinine, Ser: 0.9 mg/dL (ref 0.4–1.2)
GFR: 64.62 mL/min (ref >60.00)
Glucose, Bld: 110 mg/dL — ABNORMAL HIGH (ref 70–99)
Sodium: 142 mEq/L (ref 135–145)

## 2011-08-09 LAB — TSH: TSH: 0.03 u[IU]/mL — ABNORMAL LOW (ref 0.35–5.50)

## 2011-08-09 LAB — LIPID PANEL
HDL: 55.3 mg/dL (ref >39.00)
Total CHOL/HDL Ratio: 3

## 2011-08-09 LAB — HEPATIC FUNCTION PANEL
ALT: 18 U/L (ref 0–35)
AST: 24 U/L (ref 0–37)
Bilirubin, Direct: 0 mg/dL (ref 0.0–0.3)
Total Bilirubin: 0.7 mg/dL (ref 0.3–1.2)

## 2011-08-09 MED ORDER — BENAZEPRIL HCL 20 MG PO TABS: 20.0000 mg | ORAL_TABLET | Freq: Every day | ORAL | Status: DC

## 2011-08-09 MED ORDER — ZOLPIDEM TARTRATE 10 MG PO TABS: 10.0000 mg | ORAL_TABLET | Freq: Every evening | ORAL | Status: DC | PRN

## 2011-08-09 MED ORDER — PROPRANOLOL HCL 20 MG PO TABS: 20.0000 mg | ORAL_TABLET | Freq: Two times a day (BID) | ORAL | Status: DC

## 2011-08-09 MED ORDER — OMEPRAZOLE 40 MG PO CPDR: 40.0000 mg | DELAYED_RELEASE_CAPSULE | Freq: Every day | ORAL | Status: DC

## 2011-08-09 MED ORDER — LEVOTHYROXINE SODIUM 150 MCG PO TABS: 150.0000 ug | ORAL_TABLET | Freq: Every day | ORAL | Status: DC

## 2011-08-09 MED ORDER — LOVASTATIN 40 MG PO TABS: ORAL_TABLET | ORAL | Status: DC

## 2011-08-09 MED ORDER — CLOTRIMAZOLE-BETAMETHASONE 1-0.05 % EX CREA: TOPICAL_CREAM | Freq: Two times a day (BID) | CUTANEOUS | Status: DC

## 2011-08-09 NOTE — Patient Instructions (Addendum)
Check on insurance coverage for shingles vaccine Continue yearly mammogram Consider yearly flu vaccine Continue close monitoring of blood pressure and follow up if consistently greater than 140/90 Try to get regular exercise with walking as tolerated

## 2011-08-09 NOTE — Progress Notes (Signed)
Subjective:    Patient ID: Alexis Banks, female    DOB: 1945/04/05, 66 y.o.   MRN: [id number]  HPI  Patient here for complete physical exam medical followup. She has history of hypothyroidism, vitamin D deficiency, hyperlipidemia, depression, hypertension, CAD, GERD, and chronic insomnia. Recent nuclear stress test in April normal. No history of Pneumovax. Colonoscopy about 4-5 years ago. History of clinical shingles 6 years ago. No history of vaccine. Last tetanus less than 10 years ago per patient. She gets yearly mammograms. She had Pap smear last year which was normal. No history of abnormalities.  Past Medical History  Diagnosis Date  . Hyperlipidemia   . Hypertension   . CORONARY ARTERY DISEASE   . VARICOSE VEINS, LOWER EXTREMITIES   . HYPOTHYROIDISM   . VITAMIN D DEFICIENCY   . ANXIETY DEPRESSION   . GERD   . ROTATOR CUFF SYNDROME, RIGHT   . INSOMNIA   . Trochanteric bursitis of left hip    Past Surgical History  Procedure Date  . Cholecystectomy   . Rotator cuff surgery     reports that she has never smoked. She has never used smokeless tobacco. She reports that she does not drink alcohol or use illicit drugs. family history includes Alcohol abuse in her mother; COPD in her father; and Heart disease in an unspecified family member. No Known Allergies  1.  Risk factors based on Past Medical , Social, and Family history as above 2.  Limitations in physical activities low risk for falls. 3.  Depression/mood no current anxiety or depression issues 4.  Hearing no major deficits 5.  ADLs independent in all 6.  Cognitive function (orientation to time and place, language, writing, speech,memory) no memory, language, or speech deficits judgment intact 7.  Home Safety no issues identified 8.  Height, weight, and visual acuity. All stable 9.  Counseling discussed diet and exercise. Discussed routine monitoring of blood pressure. 10. Recommendation of preventive services.  Consider shingles vaccine as covered. Pneumovax given. Continue yearly mammogram 11. Labs based on risk factors lipid panel, hepatic panel, basic metabolic panel, vitamin D level, TSH 12. Care Plan as above.    Review of Systems  Constitutional: Negative for fever, activity change, appetite change, fatigue and unexpected weight change.  HENT: Negative for hearing loss, ear pain, sore throat and trouble swallowing.   Eyes: Negative for visual disturbance.  Respiratory: Negative for cough and shortness of breath.   Cardiovascular: Negative for chest pain and palpitations.  Gastrointestinal: Negative for abdominal pain, diarrhea, constipation and blood in stool.  Genitourinary: Negative for dysuria and hematuria.  Musculoskeletal: Negative for myalgias, back pain and arthralgias.  Skin: Negative for rash.  Neurological: Negative for dizziness, syncope and headaches.  Hematological: Negative for adenopathy.  Psychiatric/Behavioral: Negative for confusion and dysphoric mood.       Objective:   Physical Exam  Constitutional: She is oriented to person, place, and time. She appears well-developed and well-nourished.  HENT:  Head: Normocephalic and atraumatic.  Eyes: EOM are normal. Pupils are equal, round, and reactive to light.  Neck: Normal range of motion. Neck supple. No thyromegaly present.  Cardiovascular: Normal rate, regular rhythm and normal heart sounds.   No murmur heard. Pulmonary/Chest: Breath sounds normal. No respiratory distress. She has no wheezes. She has no rales.  Abdominal: Soft. Bowel sounds are normal. She exhibits no distension and no mass. There is no tenderness. There is no rebound and no guarding.  Genitourinary:  Breasts are symmetric with no mass.  Musculoskeletal: Normal range of motion. She exhibits no edema.  Lymphadenopathy:    She has no cervical adenopathy.  Neurological: She is alert and oriented to person, place, and time. She displays normal  reflexes. No cranial nerve deficit.  Skin: No rash noted.  Psychiatric: She has a normal mood and affect. Her behavior is normal. Judgment and thought content normal.          Assessment & Plan:  #1 health maintenance. Discussed several items. Patient needs Pneumovax. Continue yearly flu vaccine. Check on insurance coverage for shingles vaccine. Continue yearly mammograms. We decided to go to every other year Pap smears as she is very low risk #2 hypertension. Marginal control. Work on weight loss. Monitor closely   Meds refilled for one year. #3 history of hyperlipidemia. Recheck lipid and hepatic panel #4 vitamin D deficiency. Recheck vitamin D levels. #5 hypothyroidism. Recheck TSH

## 2011-08-10 ENCOUNTER — Telehealth: Payer: Self-pay | Admitting: Family Medicine

## 2011-08-10 MED ORDER — VITAMIN D (ERGOCALCIFEROL) 1.25 MG (50000 UNIT) PO CAPS: 50000.0000 [IU] | ORAL_CAPSULE | ORAL | Status: DC

## 2011-08-10 MED ORDER — LEVOTHYROXINE SODIUM 137 MCG PO TABS: 137.0000 ug | ORAL_TABLET | Freq: Every day | ORAL | Status: DC

## 2011-08-10 NOTE — Telephone Encounter (Signed)
Patient is calling because she had her blood drawn yesterday and now she has nickel sized area with blistering where the "rubbery tape" was.  She has applied some cream and it is better - just a FYI.  Patient is also aware of her lab results and the medication sent.

## 2011-08-24 ENCOUNTER — Ambulatory Visit: Payer: Medicare Other | Admitting: Family Medicine

## 2011-09-09 ENCOUNTER — Telehealth: Payer: Self-pay | Admitting: Family Medicine

## 2011-09-09 NOTE — Telephone Encounter (Signed)
Caller: Selisa/Patient; Patient Name: Alexis Banks; PCP: Evelena Peat; Best Callback Phone Number: [phone]. Patient reports severe cramp in her right thigh on 09/08/11. Was unable to walk during the cramp. Bengay was applied and leg was wrapped before any relief was noted. Denies any new activity prior to the cramping. All emergent symptoms ruled out per Leg Non-Injury guideline with exception to "All other situations." Home care advice given.

## 2011-09-27 ENCOUNTER — Telehealth: Payer: Self-pay | Admitting: Family Medicine

## 2011-09-27 NOTE — Telephone Encounter (Signed)
Pt requesting RX for the shingles shot to be sent to [facility] [address]

## 2011-09-27 NOTE — Telephone Encounter (Signed)
Rx will be faxed as requested 

## 2011-10-28 ENCOUNTER — Telehealth: Payer: Self-pay | Admitting: Family Medicine

## 2011-10-28 NOTE — Telephone Encounter (Signed)
Pt called and rcvd a shingles vax on 09/09/11 at [facility]. Pt wants to know if its ok to get her flu vax now?

## 2011-10-28 NOTE — Telephone Encounter (Signed)
Pt informed OK 

## 2011-11-06 ENCOUNTER — Other Ambulatory Visit: Payer: Self-pay | Admitting: Family Medicine

## 2011-12-14 ENCOUNTER — Other Ambulatory Visit: Payer: Self-pay | Admitting: Family Medicine

## 2011-12-14 DIAGNOSIS — Z1231 Encounter for screening mammogram for malignant neoplasm of breast: Secondary | ICD-10-CM

## 2011-12-15 ENCOUNTER — Ambulatory Visit: Payer: Medicare Other | Admitting: Family Medicine

## 2011-12-25 ENCOUNTER — Emergency Department (HOSPITAL_COMMUNITY): Payer: Medicare Other

## 2011-12-25 ENCOUNTER — Encounter (HOSPITAL_COMMUNITY): Payer: Self-pay | Admitting: *Deleted

## 2011-12-25 ENCOUNTER — Inpatient Hospital Stay (HOSPITAL_COMMUNITY)
Admission: EM | Admit: 2011-12-25 | Discharge: 2011-12-26 | DRG: 312 | Disposition: A | Discharge status: Home / Self Care | Payer: Medicare Other | Attending: Internal Medicine | Admitting: Internal Medicine

## 2011-12-25 DIAGNOSIS — I2510 Atherosclerotic heart disease of native coronary artery without angina pectoris: Secondary | ICD-10-CM | POA: Diagnosis present

## 2011-12-25 DIAGNOSIS — E039 Hypothyroidism, unspecified: Secondary | ICD-10-CM | POA: Diagnosis present

## 2011-12-25 DIAGNOSIS — Y9229 Other specified public building as the place of occurrence of the external cause: Secondary | ICD-10-CM

## 2011-12-25 DIAGNOSIS — F329 Major depressive disorder, single episode, unspecified: Secondary | ICD-10-CM | POA: Diagnosis present

## 2011-12-25 DIAGNOSIS — W010XXA Fall on same level from slipping, tripping and stumbling without subsequent striking against object, initial encounter: Secondary | ICD-10-CM | POA: Diagnosis present

## 2011-12-25 DIAGNOSIS — F3289 Other specified depressive episodes: Secondary | ICD-10-CM | POA: Diagnosis present

## 2011-12-25 DIAGNOSIS — I493 Ventricular premature depolarization: Secondary | ICD-10-CM | POA: Diagnosis present

## 2011-12-25 DIAGNOSIS — I498 Other specified cardiac arrhythmias: Secondary | ICD-10-CM | POA: Diagnosis present

## 2011-12-25 DIAGNOSIS — F411 Generalized anxiety disorder: Secondary | ICD-10-CM | POA: Diagnosis present

## 2011-12-25 DIAGNOSIS — I10 Essential (primary) hypertension: Secondary | ICD-10-CM | POA: Diagnosis present

## 2011-12-25 DIAGNOSIS — F341 Dysthymic disorder: Secondary | ICD-10-CM | POA: Diagnosis present

## 2011-12-25 DIAGNOSIS — R55 Syncope and collapse: Principal | ICD-10-CM | POA: Diagnosis present

## 2011-12-25 DIAGNOSIS — R9431 Abnormal electrocardiogram [ECG] [EKG]: Secondary | ICD-10-CM | POA: Diagnosis present

## 2011-12-25 LAB — CBC WITH DIFFERENTIAL/PLATELET
Basophils Absolute: 0 10*3/uL (ref 0.0–0.1)
Basophils Relative: 0 % (ref 0–1)
Eosinophils Absolute: 0.2 10*3/uL (ref 0.0–0.7)
Hemoglobin: 12.5 g/dL (ref 12.0–15.0)
MCH: 27.7 pg (ref 26.0–34.0)
MCHC: 32.5 g/dL (ref 30.0–36.0)
Neutro Abs: 2.3 10*3/uL (ref 1.7–7.7)
Neutrophils Relative %: 37 % — ABNORMAL LOW (ref 43–77)
Platelets: 252 10*3/uL (ref 150–400)
RDW: 15 % (ref 11.5–15.5)

## 2011-12-25 LAB — BASIC METABOLIC PANEL
CO2: 23 mEq/L (ref 19–32)
Calcium: 9.9 mg/dL (ref 8.4–10.5)
GFR calc Af Amer: 72 mL/min — ABNORMAL LOW (ref >90)
GFR calc non Af Amer: 62 mL/min — ABNORMAL LOW (ref >90)
Sodium: 138 mEq/L (ref 135–145)

## 2011-12-25 LAB — TROPONIN I
Troponin I: <0.3 ng/mL (ref <0.30)
Troponin I: <0.3 ng/mL (ref <0.30)

## 2011-12-25 LAB — GLUCOSE, CAPILLARY: Glucose-Capillary: 127 mg/dL — ABNORMAL HIGH (ref 70–99)

## 2011-12-25 MED ORDER — PROPRANOLOL HCL 20 MG PO TABS
20.0000 mg | ORAL_TABLET | Freq: Two times a day (BID) | ORAL | Status: DC
  Administered 2011-12-25 – 2011-12-26 (×2): 20 mg via ORAL
  Filled 2011-12-25 (×3): qty 1

## 2011-12-25 MED ORDER — VITAMIN D (ERGOCALCIFEROL) 1.25 MG (50000 UNIT) PO CAPS: 50000.0000 [IU] | ORAL_CAPSULE | ORAL | Status: DC

## 2011-12-25 MED ORDER — ONDANSETRON HCL 4 MG/2ML IJ SOLN
4.0000 mg | Freq: Once | INTRAMUSCULAR | Status: AC
  Administered 2011-12-25: 4 mg via INTRAVENOUS
  Filled 2011-12-25: qty 2

## 2011-12-25 MED ORDER — SODIUM CHLORIDE 0.9 % IV BOLUS (SEPSIS)
1000.0000 mL | Freq: Once | INTRAVENOUS | Status: AC
  Administered 2011-12-25: 1000 mL via INTRAVENOUS

## 2011-12-25 MED ORDER — SODIUM CHLORIDE 0.9 % IJ SOLN
3.0000 mL | Freq: Two times a day (BID) | INTRAMUSCULAR | Status: DC
  Administered 2011-12-25: 3 mL via INTRAVENOUS

## 2011-12-25 MED ORDER — ASPIRIN 81 MG PO CHEW
162.0000 mg | CHEWABLE_TABLET | Freq: Once | ORAL | Status: AC
  Administered 2011-12-25: 162 mg via ORAL
  Filled 2011-12-25: qty 2

## 2011-12-25 MED ORDER — SODIUM CHLORIDE 0.9 % IV SOLN: 250.0000 mL | INTRAVENOUS | Status: DC | PRN

## 2011-12-25 MED ORDER — BENAZEPRIL HCL 20 MG PO TABS
20.0000 mg | ORAL_TABLET | Freq: Every day | ORAL | Status: DC
  Administered 2011-12-26: 20 mg via ORAL
  Filled 2011-12-25: qty 1

## 2011-12-25 MED ORDER — SODIUM CHLORIDE 0.9 % IJ SOLN: 3.0000 mL | INTRAMUSCULAR | Status: DC | PRN

## 2011-12-25 MED ORDER — ASPIRIN 81 MG PO CHEW
81.0000 mg | CHEWABLE_TABLET | Freq: Every day | ORAL | Status: DC
  Administered 2011-12-26: 81 mg via ORAL
  Filled 2011-12-25: qty 1

## 2011-12-25 MED ORDER — LEVOTHYROXINE SODIUM 137 MCG PO TABS
137.0000 ug | ORAL_TABLET | Freq: Every day | ORAL | Status: DC
  Administered 2011-12-26: 137 ug via ORAL
  Filled 2011-12-25 (×3): qty 1

## 2011-12-25 MED ORDER — ONDANSETRON HCL 4 MG/2ML IJ SOLN: 4.0000 mg | Freq: Three times a day (TID) | INTRAMUSCULAR | Status: AC | PRN

## 2011-12-25 MED ORDER — SODIUM CHLORIDE 0.9 % IV SOLN
INTRAVENOUS | Status: AC
  Administered 2011-12-25: 23:00:00 via INTRAVENOUS

## 2011-12-25 MED ORDER — SIMVASTATIN 5 MG PO TABS
5.0000 mg | ORAL_TABLET | Freq: Every day | ORAL | Status: DC
  Filled 2011-12-25: qty 1

## 2011-12-25 NOTE — ED Notes (Addendum)
Attempted to call report to floor RN

## 2011-12-25 NOTE — ED Notes (Signed)
Bedside report received from previous RN. Physician at bedside.

## 2011-12-25 NOTE — ED Notes (Signed)
Report called to floor RN

## 2011-12-25 NOTE — H&P (Signed)
PCP:   Alexis Covey, MD   Chief Complaint:  Passed out  HPI: 67 yo female was shopping at [facility] and went to bend down to pick up some eggs at the bottom of the cart when she passed out and awoke on the floor at [employer].  No bleeding from head.  No prodrome.  Sudden onset.  When awoke was dizzy and had left hand tingling/sharp tingling sensation that last about 30 minutes.  No recent illnesses.  No fevers, no n/v.  Now she is still c/o vertigo when she moves to get up.  No prior h/o bpv.  No recent uri symptoms.  Po intake normal.  No cp  No sob.  No focal neuro def now except the vertigo which is positional.  Review of Systems:  O/w neg  Past Medical History: Past Medical History  Diagnosis Date  . Hyperlipidemia   . Hypertension   . CORONARY ARTERY DISEASE   . VARICOSE VEINS, LOWER EXTREMITIES   . HYPOTHYROIDISM   . VITAMIN D DEFICIENCY   . ANXIETY DEPRESSION   . GERD   . ROTATOR CUFF SYNDROME, RIGHT   . INSOMNIA   . Trochanteric bursitis of left hip    Past Surgical History  Procedure Date  . Cholecystectomy   . Rotator cuff surgery     Medications: Prior to Admission medications   Medication Sig Start Date End Date Taking? Authorizing Provider  aspirin 81 MG tablet Take 81 mg by mouth daily.     Yes Historical Provider, MD  benazepril (LOTENSIN) 20 MG tablet Take 20 mg by mouth daily. 08/09/11  Yes Alexis Covey, MD  Calcium-Vitamin D (CALTRATE 600 PLUS-VIT D PO) Take by mouth daily.     Yes Historical Provider, MD  Coenzyme Q10 (COQ10) 150 MG CAPS Take 150 mg by mouth daily.     Yes Historical Provider, MD  levothyroxine (SYNTHROID, LEVOTHROID) 137 MCG tablet Take 137 mcg by mouth daily.   Yes Historical Provider, MD  lovastatin (MEVACOR) 40 MG tablet 1 tab hs for hyperlipidemia 08/09/11  Yes Alexis Covey, MD  Multiple Vitamin (MULTIVITAMIN) tablet Take 1 tablet by mouth daily.     Yes Historical Provider, MD  nitroGLYCERIN (NITROSTAT) 0.4 MG SL tablet  Place 1 tablet (0.4 mg total) under the tongue every 5 (five) minutes as needed for chest pain. 04/22/11 04/21/12 Yes Lewayne Bunting, MD  OMEGA 3-6-9 FATTY ACIDS PO Take by mouth daily.     Yes Historical Provider, MD  omeprazole (PRILOSEC) 40 MG capsule Take 1 capsule (40 mg total) by mouth daily. 08/09/11  Yes Alexis Covey, MD  propranolol (INDERAL) 20 MG tablet Take 1 tablet (20 mg total) by mouth 2 (two) times daily. 08/09/11  Yes Alexis Covey, MD  Vitamin D, Ergocalciferol, (DRISDOL) 50000 UNITS CAPS Take 1 capsule (50,000 Units total) by mouth every 7 (seven) days. 08/10/11  Yes Alexis Covey, MD  zolpidem (AMBIEN) 10 MG tablet Take 1 tablet (10 mg total) by mouth at bedtime as needed for sleep. 09/14/10 10/14/10  Damian Leavell., MD    Allergies:   Allergies  Allergen Reactions  . Adhesive (Tape) Rash    Rubbery tape - blisters    Social History:  reports that she has never smoked. She has never used smokeless tobacco. She reports that she does not drink alcohol or use illicit drugs.  Family History: Family History  Problem Relation Age of Onset  . Heart disease  No family history of  . COPD Father   . Alcohol abuse Mother     Physical Exam: Filed Vitals:   12/25/11 1704 12/25/11 1708 12/25/11 1735  BP: 145/89  155/83  Pulse: 61  63  Temp:   97.8 F (36.6 C)  TempSrc:   Oral  Resp: 15  18  Height:  5' 3.5" (1.613 m)   Weight:  78.019 kg (172 lb)   SpO2: 98%     General appearance: alert, cooperative and no distress Neck: no JVD and supple, symmetrical, trachea midline Lungs: clear to auscultation bilaterally Heart: regular rate and rhythm, S1, S2 normal, no murmur, click, rub or gallop Abdomen: soft, non-tender; bowel sounds normal; no masses,  no organomegaly Extremities: extremities normal, atraumatic, no cyanosis or edema Pulses: 2+ and symmetric Skin: Skin color, texture, turgor normal. No rashes or lesions Neurologic: Grossly  normal    Labs on Admission:   [facility] 12/25/11 1722  NA 138  K 3.9  CL 102  CO2 23  GLUCOSE 138*  BUN 21  CREATININE 0.93  CALCIUM 9.9  MG --  PHOS --    Basename 12/25/11 1723  CKTOTAL --  CKMB --  CKMBINDEX --  TROPONINI <0.30   Radiological Exams on Admission: Dg Chest 1 View  12/25/2011  *RADIOLOGY REPORT*  Clinical Data: Scapular pain, fall  CHEST - 1 VIEW  Comparison: 05/21/2007  Findings: Cardiomediastinal silhouette is stable.  Moderate sized hiatal hernia again noted.  No acute infiltrate or pleural effusion.  No diagnostic pneumothorax.  IMPRESSION: No active disease.  Moderate sized hiatal hernia again noted.   Original Report Authenticated By: Natasha Mead, M.D.     Assessment/Plan 66 yo female with syncopal episode of unclear etiology with brief left hand tingling  Principal Problem:  *Syncope Active Problems:  HYPOTHYROIDISM  ANXIETY DEPRESSION  HYPERTENSION  CORONARY ARTERY DISEASE  ekg no significant arrythmia and no acute changes but with some nonspec t w changes inf leads.  Pt has normal stress testing in march of this year.ef 57%.  Place on tele.  Obtain 2decho, mri, carotid dopplers.  Serial enzymes.  Ck orthostatics.  Ck neuro ck overnight.    Alexis Banks A 12/25/2011, 6:58 PM

## 2011-12-25 NOTE — ED Notes (Signed)
Pt from home with c/o sudden weakness, nausea, and diaphoresis. Sts she is having a dull ache between her shoulder blades immediately after near syncope. Patient shob at this time with c/o diaphoresis and tingling in her legs.

## 2011-12-25 NOTE — ED Provider Notes (Addendum)
History     CSN: [id number]  Arrival date & time 12/25/11  1655   First MD Initiated Contact with Patient 12/25/11 1712      Chief Complaint  Patient presents with  . Near Syncope    (Consider location/radiation/quality/duration/timing/severity/associated sxs/prior treatment) HPI Comments: Pt comes in with cc of syncope. Pt has hx of CAD, CHF and states that while at [facility], she had bent down to get groceries, sarted feeling like everything was going dark, and then the next thing she recalls is getting up from the floor. She denies any preceding chest pain, palpitations, sob, nausea, dizziness. She denies any blood loss, no n/v/diarrhea and no hx of syncope.  Since arriving to the ED, patient is complaining of some pain by her scapular region and nausea - but no emesis. Cardiology notes indicate NSTEMI in 2005, and a MPI in 2009, with EF of 50%.    The history is provided by the patient.    Past Medical History  Diagnosis Date  . Hyperlipidemia   . Hypertension   . CORONARY ARTERY DISEASE   . VARICOSE VEINS, LOWER EXTREMITIES   . HYPOTHYROIDISM   . VITAMIN D DEFICIENCY   . ANXIETY DEPRESSION   . GERD   . ROTATOR CUFF SYNDROME, RIGHT   . INSOMNIA   . Trochanteric bursitis of left hip     Past Surgical History  Procedure Date  . Cholecystectomy   . Rotator cuff surgery     Family History  Problem Relation Age of Onset  . Heart disease      No family history of  . COPD Father   . Alcohol abuse Mother     History  Substance Use Topics  . Smoking status: Never Smoker   . Smokeless tobacco: Never Used  . Alcohol Use: No    OB History    Grav Para Term Preterm Abortions TAB SAB Ect Mult Living                  Review of Systems  Constitutional: Negative for activity change.  HENT: Negative for facial swelling and neck pain.   Respiratory: Negative for cough, shortness of breath and wheezing.   Cardiovascular: Negative for chest pain.   Gastrointestinal: Negative for nausea, vomiting, abdominal pain, diarrhea, constipation, blood in stool and abdominal distention.  Genitourinary: Negative for dysuria, hematuria and difficulty urinating.  Skin: Negative for color change.  Neurological: Positive for syncope. Negative for speech difficulty and headaches.  Hematological: Does not bruise/bleed easily.  Psychiatric/Behavioral: Negative for confusion.    Allergies  Adhesive  Home Medications   Current Outpatient Rx  Name  Route  Sig  Dispense  Refill  . ASPIRIN 81 MG PO TABS   Oral   Take 81 mg by mouth daily.           Marland Kitchen BENAZEPRIL HCL 20 MG PO TABS   Oral   Take 20 mg by mouth daily.         Marland Kitchen CALTRATE 600 PLUS-VIT D PO   Oral   Take by mouth daily.           Marland Kitchen COENZYME Q10 150 MG PO CAPS   Oral   Take 150 mg by mouth daily.           Marland Kitchen LEVOTHYROXINE SODIUM 137 MCG PO TABS   Oral   Take 137 mcg by mouth daily.         Marland Kitchen LOVASTATIN 40 MG PO TABS  1 tab hs for hyperlipidemia   90 tablet   3   . ONE-DAILY MULTI VITAMINS PO TABS   Oral   Take 1 tablet by mouth daily.           Marland Kitchen NITROGLYCERIN 0.4 MG SL SUBL   Sublingual   Place 1 tablet (0.4 mg total) under the tongue every 5 (five) minutes as needed for chest pain.   25 tablet   12   . OMEGA 3-6-9 FATTY ACIDS PO   Oral   Take by mouth daily.           Marland Kitchen OMEPRAZOLE 40 MG PO CPDR   Oral   Take 1 capsule (40 mg total) by mouth daily.   90 capsule   3   . PROPRANOLOL HCL 20 MG PO TABS   Oral   Take 1 tablet (20 mg total) by mouth 2 (two) times daily.   180 tablet   3   . VITAMIN D (ERGOCALCIFEROL) 50000 UNITS PO CAPS   Oral   Take 1 capsule (50,000 Units total) by mouth every 7 (seven) days.   90 capsule   0   . ZOLPIDEM TARTRATE 10 MG PO TABS   Oral   Take 1 tablet (10 mg total) by mouth at bedtime as needed for sleep.   30 tablet   5     BP 155/83  Pulse 63  Temp 97.8 F (36.6 C) (Oral)  Resp 18  Ht 5'  3.5" (1.613 m)  Wt 172 lb (78.019 kg)  BMI 29.99 kg/m2  SpO2 98%  Physical Exam  Vitals reviewed. Constitutional: She is oriented to person, place, and time. She appears well-developed.  HENT:  Head: Normocephalic and atraumatic.       No carotid bruits  Eyes: Conjunctivae normal and EOM are normal. Pupils are equal, round, and reactive to light.  Neck: Normal range of motion. Neck supple. No JVD present.  Cardiovascular: Normal rate and regular rhythm.   Murmur heard. Pulmonary/Chest: Effort normal and breath sounds normal. No respiratory distress.  Abdominal: Soft. Bowel sounds are normal. She exhibits no distension. There is no tenderness. There is no rebound and no guarding.  Musculoskeletal: She exhibits no edema.  Neurological: She is alert and oriented to person, place, and time.  Skin: Skin is warm and dry.    ED Course  Procedures (including critical care time)  Labs Reviewed  BASIC METABOLIC PANEL - Abnormal; Notable for the following:    Glucose, Bld 138 (*)     GFR calc non Af Amer 62 (*)     GFR calc Af Amer 72 (*)     All other components within normal limits  GLUCOSE, CAPILLARY - Abnormal; Notable for the following:    Glucose-Capillary 127 (*)     All other components within normal limits  TROPONIN I   No results found.   No diagnosis found.    MDM   Date: 12/25/2011  Rate: 68  Rhythm: normal sinus rhythm  QRS Axis: normal  Intervals: normal  ST/T Wave abnormalities: nonspecific ST/T changes  Conduction Disutrbances:none  Narrative Interpretation:   Old EKG Reviewed: changes noted - new t wave inversion in leads v5, v6   Date: 12/25/2011  Rate: 61  Rhythm: normal sinus rhythm  QRS Axis: left  Intervals: normal  ST/T Wave abnormalities: nonspecific ST/T changes  Conduction Disutrbances:none  Narrative Interpretation:   Old EKG Reviewed: unchanged  DDx includes: Orthostatic hypotension Stroke Vertebral artery  dissection/stenosis Dysrhythmia PE Vasovagal/neurocardiogenic syncope Aortic stenosis Valvular disorder/Cardiomyopathy Anemia  Pt comes in with cc of syncope. Unprovoked, with no prodrome. EKG has some new ST changes, and she has hx of CHF - so we will admit to tele.  She has this scapular pain and nausea, so i ordered 2 ekg, and there is no evidence of STEMI. No chest pain. Will continue to monitor.    Derwood Kaplan, MD 12/25/11 1824  The Hospitalist team is requesting CT head, so we have placed the order. Patient to be admitted for syncope workup.  Derwood Kaplan, MD 12/25/11 [phone]

## 2011-12-25 NOTE — ED Notes (Signed)
Documented under wrong name. 

## 2011-12-26 ENCOUNTER — Inpatient Hospital Stay (HOSPITAL_COMMUNITY): Payer: Medicare Other

## 2011-12-26 DIAGNOSIS — R55 Syncope and collapse: Secondary | ICD-10-CM

## 2011-12-26 DIAGNOSIS — I493 Ventricular premature depolarization: Secondary | ICD-10-CM | POA: Diagnosis present

## 2011-12-26 DIAGNOSIS — I517 Cardiomegaly: Secondary | ICD-10-CM

## 2011-12-26 LAB — LIPID PANEL
LDL Cholesterol: 60 mg/dL (ref 0–99)
Total CHOL/HDL Ratio: 2.6 RATIO
VLDL: 19 mg/dL (ref 0–40)

## 2011-12-26 LAB — TROPONIN I
Troponin I: <0.3 ng/mL (ref <0.30)
Troponin I: <0.3 ng/mL (ref <0.30)

## 2011-12-26 LAB — TSH: TSH: 0.109 u[IU]/mL — ABNORMAL LOW (ref 0.350–4.500)

## 2011-12-26 MED ORDER — ACETAMINOPHEN 325 MG PO TABS
650.0000 mg | ORAL_TABLET | Freq: Four times a day (QID) | ORAL | Status: DC | PRN
  Administered 2011-12-26: 650 mg via ORAL

## 2011-12-26 MED ORDER — ACETAMINOPHEN 325 MG PO TABS
ORAL_TABLET | ORAL | Status: AC
  Filled 2011-12-26: qty 2

## 2011-12-26 NOTE — Discharge Summary (Signed)
Physician Discharge Summary  Alexis Banks:[id number] DOB: Sep 21, 1945 DOA: 12/25/2011  PCP: Kristian Covey, MD  Admit date: 12/25/2011 Discharge date: 12/26/2011  Recommendations for Outpatient Follow-up:  1. F/U final carotid doppler report (preliminary report showed no ICA stenosis). 2. F/U TSH results (pending at time of d/c). 3. Note: MRI showed non-specific white matter lesions, may need further evaluation if symptoms persist.  Discharge Diagnoses:   Principal Problem:  *Syncope Active Problems:   HYPOTHYROIDISM   ANXIETY DEPRESSION   HYPERTENSION   CORONARY ARTERY DISEASE   Ventricular ectopy   Abnormal EKG   Discharge Condition: Stable, improved.  Diet recommendation: Low sodium-heart heart healthy.  History of present illness:  Alexis Banks is a 66 year old woman with past medical history of coronary artery disease, hypothyroidism, hypertension, anxiety, and hyperlipidemia who was admitted to the hospital on 12/25/2011 after a syncopal episode.  Hospital Course by problem:  Principal Problem:  *Syncope / ventricular ectopy / Abnormal EKG  Admitted to telemetry. Initial EKG showed no arrhythmia. Nonspecific T-wave changes noted in inferior leads, which were not new when compared to prior EKG done 03/25/11.  History of normal stress testing in March of 2013 ejection fraction of 57%.  2-D echocardiogram showed an EF of 55% and grade I diastolic dysfunction with no RWMA.   Transient ventricular bigeminy noted on telemetry.  Magnesium WNL.   Carotid Dopplers negative for ICA stenosis (preliminary).  MRI of the brain showed nonspecific white matter lesions. Results discussed with patient and her husband. Active Problems:  HYPOTHYROIDISM  Continue Synthroid. Last TSH was abnormally low back in July. TSH re-checked, results pending. ANXIETY DEPRESSION  Monitor. HYPERTENSION  Continue Lotensin, propranolol, and simvastatin.  CORONARY ARTERY DISEASE  Troponins  negative x3. Continue beta blocker, statin, and aspirin.  Procedures:  2 D Echocardiogram 12/26/11: EF 55%, grade I diastolic dysfunction, no RWMA.  Carotid dopplers 12/26/11: No ICA stenosis  Consultations:  None.  Discharge Exam: Filed Vitals:   12/26/11 0500  BP: 127/71  Pulse: 86  Temp: 97.3 F (36.3 C)  Resp: 18   Filed Vitals:   12/25/11 2050 12/25/11 2100 12/25/11 2317 12/26/11 0500  BP: 167/85 148/67 138/71 127/71  Pulse:   83 86  Temp:   98.1 F (36.7 C) 97.3 F (36.3 C)  TempSrc:   Oral Oral  Resp:  19 18 18   Height:   5\' 3"  (1.6 m)   Weight:   79.379 kg (175 lb)   SpO2:   97% 96%    Gen:  NAD Cardiovascular:  RRR, No M/R/G Respiratory: Lungs CTAB Gastrointestinal: Abdomen soft, NT/ND with normal active bowel sounds. Extremities: No C/E/C Neurological: Non focal.   Discharge Instructions  Discharge Orders    Future Appointments: Provider: Department: Dept Phone: Center:   01/18/2012 11:00 AM Kristian Covey, MD  [facility] [place] [phone] [facility]   01/18/2012 1:30 PM [facility] 1 [facility] [place] [facility] [phone]     Future Orders Please Complete By Expires   Diet - low sodium heart healthy      Increase activity slowly      Call MD for:  persistant dizziness or light-headedness      Call MD for:      Scheduling Instructions:   Double vision, blurry vision, vertigo or other new symptoms that concern you.       Medication List     As of 12/26/2011  4:48 PM    TAKE these medications  aspirin 81 MG tablet   Take 81 mg by mouth daily.      benazepril 20 MG tablet   Commonly known as: LOTENSIN   Take 20 mg by mouth daily.      CALTRATE 600 PLUS-VIT D PO   Take by mouth daily.      Coenzyme Q10 150 MG Caps   Take 150 mg by mouth daily.      levothyroxine 137 MCG tablet   Commonly known as: SYNTHROID, LEVOTHROID   Take 137 mcg by mouth daily.      lovastatin 40 MG tablet    Commonly known as: MEVACOR   1 tab hs for hyperlipidemia      multivitamin tablet   Take 1 tablet by mouth daily.      nitroGLYCERIN 0.4 MG SL tablet   Commonly known as: NITROSTAT   Place 1 tablet (0.4 mg total) under the tongue every 5 (five) minutes as needed for chest pain.      OMEGA 3-6-9 FATTY ACIDS PO   Take by mouth daily.      omeprazole 40 MG capsule   Commonly known as: PRILOSEC   Take 1 capsule (40 mg total) by mouth daily.      propranolol 20 MG tablet   Commonly known as: INDERAL   Take 1 tablet (20 mg total) by mouth 2 (two) times daily.      Vitamin D (Ergocalciferol) 50000 UNITS Caps   Commonly known as: DRISDOL   Take 1 capsule (50,000 Units total) by mouth every 7 (seven) days.      zolpidem 10 MG tablet   Commonly known as: AMBIEN   Take 1 tablet (10 mg total) by mouth at bedtime as needed for sleep.           Follow-up Information    Follow up with Kristian Covey, MD. Schedule an appointment as soon as possible for a visit in 1 week.   Contact information:   [address] Christena Flake [place] [address] [phone]       Follow up with Olga Millers, MD. (As needed)    Contact information:   [address] [place], [address]                         0 [place] [address] [phone]           The results of significant diagnostics from this hospitalization (including imaging, microbiology, ancillary and laboratory) are listed below for reference.    Significant Diagnostic Studies: Dg Chest 1 View  12/25/2011  *RADIOLOGY REPORT*  Clinical Data: Scapular pain, fall  CHEST - 1 VIEW  Comparison: 05/21/2007  Findings: Cardiomediastinal silhouette is stable.  Moderate sized hiatal hernia again noted.  No acute infiltrate or pleural effusion.  No diagnostic pneumothorax.  IMPRESSION: No active disease.  Moderate sized hiatal hernia again noted.   Original Report Authenticated By: Natasha Mead, M.D.    Ct Head Wo Contrast  12/25/2011  *RADIOLOGY  REPORT*  Clinical Data:  Syncope.  CT HEAD WITHOUT CONTRAST  Technique: Contiguous axial images were obtained from the base of the skull through the vertex without contrast.  Comparison: 05/21/2007.  Findings: Normal appearing cerebral hemispheres and posterior fossa structures.  Normal size and position of the ventricles.  No intracranial hemorrhage, mass lesion or evidence of acute infarction.  No skull fractures or paranasal sinus air-fluid levels.  Unremarkable bones and included portions of the paranasal sinuses.  IMPRESSION: Normal examination.   Original Report Authenticated By: Beckie Salts, M.D.    Mr [facility] Wo Contrast  12/26/2011  *RADIOLOGY REPORT*  Clinical Data:  Syncopal episode  MRI HEAD WITHOUT CONTRAST MRA HEAD WITHOUT CONTRAST  Technique:  Multiplanar, multiecho pulse sequences of the brain and surrounding structures were obtained without intravenous contrast. Angiographic images of the head were obtained using MRA technique without contrast.  Comparison:  CT head without contrast 12/25/2011.  MRI HEAD  Findings:  Scattered periventricular and subcortical T2 hyperintensities are present bilaterally.  The most prominent to is adjacent to the atrium of the left lateral ventricle, measuring 9 x 8 x 4 mm.  No acute infarct, hemorrhage, mass lesion is present.  The ventricles are of normal size.  No significant extra-axial fluid collection is present. Flow is present in the major intracranial arteries.  The globes and orbits are intact.  Minimal mucosal thickening is present along the floor of the left maxillary sinus. The paranasal sinuses are otherwise clear.  There is minimal fluid in the left mastoid air cells.  The no obstructing nasopharyngeal lesion is evident.  IMPRESSION:  1.  Scattered white matter lesions are more conspicuous than expected for age. The finding is nonspecific but can be seen in the setting of chronic microvascular ischemia, a demyelinating process such as multiple  sclerosis, vasculitis, complicated migraine headaches, or as the sequelae of a prior infectious or inflammatory process. 2.  No acute intracranial abnormality.  MRA HEAD  Findings: The internal carotid arteries are within normal limits from high cervical segments through the ICA termini bilaterally. The A1 and M1 segments are normal.  The anterior communicating artery is patent.  The ACA and MCA branch vessels are within normal limits.  The left vertebral artery is slightly dominant to the right.  The PICA origins are visualized and within normal limits bilaterally. The basilar artery is normal.  Both posterior cerebral arteries originate from basilar tip.  A left posterior communicating artery is patent.  The PCA branch vessels are within normal limits.  IMPRESSION:  1.  Normal variant MRA circle of Willis without evidence for significant proximal stenosis, aneurysm, or branch vessel occlusion.   Original Report Authenticated By: Marin Roberts, M.D.    Mr Brain Wo Contrast  12/26/2011  *RADIOLOGY REPORT*  Clinical Data:  Syncopal episode  MRI HEAD WITHOUT CONTRAST MRA HEAD WITHOUT CONTRAST  Technique:  Multiplanar, multiecho pulse sequences of the brain and surrounding structures were obtained without intravenous contrast. Angiographic images of the head were obtained using MRA technique without contrast.  Comparison:  CT head without contrast 12/25/2011.  MRI HEAD  Findings:  Scattered periventricular and subcortical T2 hyperintensities are present bilaterally.  The most prominent to is adjacent to the atrium of the left lateral ventricle, measuring 9 x 8 x 4 mm.  No acute infarct, hemorrhage, mass lesion is present.  The ventricles are of normal size.  No significant extra-axial fluid collection is present. Flow is present in the major intracranial arteries.  The globes and orbits are intact.  Minimal mucosal thickening is present along the floor of the left maxillary sinus. The paranasal sinuses are  otherwise clear.  There is minimal fluid in the left mastoid air cells.  The no obstructing nasopharyngeal lesion is evident.  IMPRESSION:  1.  Scattered white matter lesions are more conspicuous than expected for age. The finding is nonspecific but can be seen in the setting of chronic microvascular ischemia, a demyelinating process such  as multiple sclerosis, vasculitis, complicated migraine headaches, or as the sequelae of a prior infectious or inflammatory process. 2.  No acute intracranial abnormality.  MRA HEAD  Findings: The internal carotid arteries are within normal limits from high cervical segments through the ICA termini bilaterally. The A1 and M1 segments are normal.  The anterior communicating artery is patent.  The ACA and MCA branch vessels are within normal limits.  The left vertebral artery is slightly dominant to the right.  The PICA origins are visualized and within normal limits bilaterally. The basilar artery is normal.  Both posterior cerebral arteries originate from basilar tip.  A left posterior communicating artery is patent.  The PCA branch vessels are within normal limits.  IMPRESSION:  1.  Normal variant MRA circle of Willis without evidence for significant proximal stenosis, aneurysm, or branch vessel occlusion.   Original Report Authenticated By: Marin Roberts, M.D.     Microbiology: No results found for this or any previous visit (from the past 240 hour(s)).   Labs: Basic Metabolic Panel:  Lab 12/26/11 1610 12/25/11 1722  NA -- 138  K -- 3.9  CL -- 102  CO2 -- 23  GLUCOSE -- 138*  BUN -- 21  CREATININE -- 0.93  CALCIUM -- 9.9  MG 1.9 --  PHOS -- --   CBC:  Lab 12/25/11 1723  WBC 6.2  NEUTROABS 2.3  HGB 12.5  HCT 38.5  MCV 85.2  PLT 252   Cardiac Enzymes:  Lab 12/26/11 0810 12/26/11 0237 12/25/11 2042 12/25/11 1723  CKTOTAL -- -- -- --  CKMB -- -- -- --  CKMBINDEX -- -- -- --  TROPONINI <0.30 <0.30 <0.30 <0.30   CBG:  Lab 12/25/11 1711   GLUCAP 127*    Time coordinating discharge: 30 minutes.  Signed:  RAMA,CHRISTINA  Pager [phone] [facility] 12/26/2011, 4:48 PM

## 2011-12-26 NOTE — Progress Notes (Signed)
  Echocardiogram 2D Echocardiogram has been performed.  Yvone Slape 12/26/2011, 11:59 AM

## 2011-12-26 NOTE — Progress Notes (Signed)
Periods of bigeminy ntd., Pt. Asx. Informed Dr. Darnelle Catalan with orders received for Mag level and EKG. Will cont to monitor progress.

## 2011-12-26 NOTE — Progress Notes (Signed)
[facility] PROGRESS NOTE  Alexis Banks:[id number] DOB: August 02, 1945 DOA: 12/25/2011 PCP: Kristian Covey, MD  Brief narrative: Alexis Banks is a 66 year old woman with past medical history of coronary artery disease, hypothyroidism, hypertension, anxiety, and hyperlipidemia who was admitted to the hospital on 12/25/2011 after a syncopal episode.  Assessment/Plan: Principal Problem:  *Syncope  Admitted to telemetry. Initial EKG showed no arrhythmia. Nonspecific T-wave changes noted in inferior leads. History of normal stress testing in March of 2013 ejection fraction of 57%.  Followup 2-D echocardiogram and carotid Dopplers.  MRI of the brain showed nonspecific white matter lesions. Results discussed with patient and her husband. Active Problems:  HYPOTHYROIDISM  Continue Synthroid. Last TSH was abnormally low back in July. We'll recheck.  ANXIETY DEPRESSION  Monitor.  HYPERTENSION  Continue Lotensin, propranolol, and simvastatin.   CORONARY ARTERY DISEASE  Troponins negative x3. Continue beta blocker, statin, and aspirin.  Code Status: Full. Family Communication: Husband and son at bedside. Disposition Plan: Home when stable.   Medical Consultants:  None.  Other Consultants:  None.  Anti-infectives:  None.  HPI/Subjective: Alexis Banks denies any current problems with paresthesias, presyncope, dizziness, or vertigo. She feels well and is asking to go home.  Objective: Filed Vitals:   12/25/11 2050 12/25/11 2100 12/25/11 2317 12/26/11 0500  BP: 167/85 148/67 138/71 127/71  Pulse:   83 86  Temp:   98.1 F (36.7 C) 97.3 F (36.3 C)  TempSrc:   Oral Oral  Resp:  19 18 18   Height:   5\' 3"  (1.6 m)   Weight:   79.379 kg (175 lb)   SpO2:   97% 96%    Intake/Output Summary (Last 24 hours) at 12/26/11 1241 Last data filed at 12/26/11 0700  Gross per 24 hour  Intake    830 ml  Output    800 ml  Net     30 ml    Exam: Gen:  NAD Cardiovascular:   RRR, No M/R/G Respiratory:  Lungs CTAB Gastrointestinal:  Abdomen soft, NT/ND, + BS Extremities:  No C/E/C Neurological: Nonfocal.  Data Reviewed: Basic Metabolic Panel:  Lab 12/25/11 4401  NA 138  K 3.9  CL 102  CO2 23  GLUCOSE 138*  BUN 21  CREATININE 0.93  CALCIUM 9.9  MG --  PHOS --   GFR Estimated Creatinine Clearance: 58.6 ml/min (by C-G formula based on Cr of 0.93).  CBC:  Lab 12/25/11 1723  WBC 6.2  NEUTROABS 2.3  HGB 12.5  HCT 38.5  MCV 85.2  PLT 252   Cardiac Enzymes:  Lab 12/26/11 0810 12/26/11 0237 12/25/11 2042 12/25/11 1723  CKTOTAL -- -- -- --  CKMB -- -- -- --  CKMBINDEX -- -- -- --  TROPONINI <0.30 <0.30 <0.30 <0.30   CBG:  Lab 12/25/11 1711  GLUCAP 127*   Microbiology No results found for this or any previous visit (from the past 240 hour(s)).   Procedures and Diagnostic Studies: Dg Chest 1 View  12/25/2011  *RADIOLOGY REPORT*  Clinical Data: Scapular pain, fall  CHEST - 1 VIEW  Comparison: 05/21/2007  Findings: Cardiomediastinal silhouette is stable.  Moderate sized hiatal hernia again noted.  No acute infiltrate or pleural effusion.  No diagnostic pneumothorax.  IMPRESSION: No active disease.  Moderate sized hiatal hernia again noted.   Original Report Authenticated By: Natasha Mead, M.D.    Ct Head Wo Contrast  12/25/2011  *RADIOLOGY REPORT*  Clinical Data:  Syncope.  CT HEAD WITHOUT CONTRAST  Technique:  Contiguous axial images were obtained from the base of the skull through the vertex without contrast.  Comparison: 05/21/2007.  Findings: Normal appearing cerebral hemispheres and posterior fossa structures.  Normal size and position of the ventricles.  No intracranial hemorrhage, mass lesion or evidence of acute infarction.  No skull fractures or paranasal sinus air-fluid levels.  Unremarkable bones and included portions of the paranasal sinuses.  IMPRESSION: Normal examination.   Original Report Authenticated By: Beckie Salts, M.D.    Mr  [facility] Wo Contrast  12/26/2011  *RADIOLOGY REPORT*  Clinical Data:  Syncopal episode  MRI HEAD WITHOUT CONTRAST MRA HEAD WITHOUT CONTRAST  Technique:  Multiplanar, multiecho pulse sequences of the brain and surrounding structures were obtained without intravenous contrast. Angiographic images of the head were obtained using MRA technique without contrast.  Comparison:  CT head without contrast 12/25/2011.  MRI HEAD  Findings:  Scattered periventricular and subcortical T2 hyperintensities are present bilaterally.  The most prominent to is adjacent to the atrium of the left lateral ventricle, measuring 9 x 8 x 4 mm.  No acute infarct, hemorrhage, mass lesion is present.  The ventricles are of normal size.  No significant extra-axial fluid collection is present. Flow is present in the major intracranial arteries.  The globes and orbits are intact.  Minimal mucosal thickening is present along the floor of the left maxillary sinus. The paranasal sinuses are otherwise clear.  There is minimal fluid in the left mastoid air cells.  The no obstructing nasopharyngeal lesion is evident.  IMPRESSION:  1.  Scattered white matter lesions are more conspicuous than expected for age. The finding is nonspecific but can be seen in the setting of chronic microvascular ischemia, a demyelinating process such as multiple sclerosis, vasculitis, complicated migraine headaches, or as the sequelae of a prior infectious or inflammatory process. 2.  No acute intracranial abnormality.  MRA HEAD  Findings: The internal carotid arteries are within normal limits from high cervical segments through the ICA termini bilaterally. The A1 and M1 segments are normal.  The anterior communicating artery is patent.  The ACA and MCA branch vessels are within normal limits.  The left vertebral artery is slightly dominant to the right.  The PICA origins are visualized and within normal limits bilaterally. The basilar artery is normal.  Both posterior  cerebral arteries originate from basilar tip.  A left posterior communicating artery is patent.  The PCA branch vessels are within normal limits.  IMPRESSION:  1.  Normal variant MRA circle of Willis without evidence for significant proximal stenosis, aneurysm, or branch vessel occlusion.   Original Report Authenticated By: Marin Roberts, M.D.    Mr Brain Wo Contrast  12/26/2011  *RADIOLOGY REPORT*  Clinical Data:  Syncopal episode  MRI HEAD WITHOUT CONTRAST MRA HEAD WITHOUT CONTRAST  Technique:  Multiplanar, multiecho pulse sequences of the brain and surrounding structures were obtained without intravenous contrast. Angiographic images of the head were obtained using MRA technique without contrast.  Comparison:  CT head without contrast 12/25/2011.  MRI HEAD  Findings:  Scattered periventricular and subcortical T2 hyperintensities are present bilaterally.  The most prominent to is adjacent to the atrium of the left lateral ventricle, measuring 9 x 8 x 4 mm.  No acute infarct, hemorrhage, mass lesion is present.  The ventricles are of normal size.  No significant extra-axial fluid collection is present. Flow is present in the major intracranial arteries.  The globes and orbits are intact.  Minimal mucosal thickening is present  along the floor of the left maxillary sinus. The paranasal sinuses are otherwise clear.  There is minimal fluid in the left mastoid air cells.  The no obstructing nasopharyngeal lesion is evident.  IMPRESSION:  1.  Scattered white matter lesions are more conspicuous than expected for age. The finding is nonspecific but can be seen in the setting of chronic microvascular ischemia, a demyelinating process such as multiple sclerosis, vasculitis, complicated migraine headaches, or as the sequelae of a prior infectious or inflammatory process. 2.  No acute intracranial abnormality.  MRA HEAD  Findings: The internal carotid arteries are within normal limits from high cervical segments  through the ICA termini bilaterally. The A1 and M1 segments are normal.  The anterior communicating artery is patent.  The ACA and MCA branch vessels are within normal limits.  The left vertebral artery is slightly dominant to the right.  The PICA origins are visualized and within normal limits bilaterally. The basilar artery is normal.  Both posterior cerebral arteries originate from basilar tip.  A left posterior communicating artery is patent.  The PCA branch vessels are within normal limits.  IMPRESSION:  1.  Normal variant MRA circle of Willis without evidence for significant proximal stenosis, aneurysm, or branch vessel occlusion.   Original Report Authenticated By: Marin Roberts, M.D.    Scheduled Meds:    . [COMPLETED] sodium chloride   Intravenous STAT  . acetaminophen      . [COMPLETED] aspirin  162 mg Oral Once  . aspirin  81 mg Oral Daily  . benazepril  20 mg Oral Daily  . levothyroxine  137 mcg Oral QAC breakfast  . [COMPLETED] ondansetron  4 mg Intravenous Once  . propranolol  20 mg Oral BID  . simvastatin  5 mg Oral q1800  . [COMPLETED] sodium chloride  1,000 mL Intravenous Once  . sodium chloride  3 mL Intravenous Q12H  . Vitamin D (Ergocalciferol)  50,000 Units Oral Q7 days   Continuous Infusions:   Time spent: 35 minutes.   LOS: 1 day   Aidel Davisson  [facility] Pager [phone].  If 8PM-8AM, please contact night-coverage at [email or web address], password [facility] 12/26/2011, 12:41 PM

## 2011-12-26 NOTE — Progress Notes (Signed)
D/C home accomp by husband. All D/c instructions/ meds reviewed. Also discussed importance of keeping F/u appts.  Calm, pleasant upon d/c

## 2011-12-26 NOTE — Progress Notes (Signed)
VASCULAR LAB PRELIMINARY  PRELIMINARY  PRELIMINARY  PRELIMINARY  Carotid Dopplers completed.    Preliminary report:  There is no ICA stenosis.  Vertebral artery flow is antegrade.  Ardit Danh, RVT 12/26/2011, 4:14 PM

## 2011-12-27 ENCOUNTER — Telehealth: Payer: Self-pay | Admitting: Family Medicine

## 2011-12-27 ENCOUNTER — Telehealth: Payer: Self-pay | Admitting: Cardiology

## 2011-12-27 NOTE — Telephone Encounter (Signed)
Pt informed and voiced understanding

## 2011-12-27 NOTE — Telephone Encounter (Signed)
Pt had an episode at [employer], where she bent down, everything went black, room started spinning, got nauseated, had dry heaves, went to ER, all test were normal, was to call to let us know and have dr Jens Som review tests that were done, pls call with advice

## 2011-12-27 NOTE — Telephone Encounter (Signed)
Pt was admitted to [facility] over the weekend (Dr Rama).  Pt wanted to let Dr aware. Pt has appt on 12/31. She had LOTS of test done while she was there. Just wanted to know if Dr Caryl Never wants to see her before then.  Dr Darnelle Catalan said MD can pull up any of her test he would like to see. (FYI) Pt had fainting/blackout  In [employer].

## 2011-12-27 NOTE — Telephone Encounter (Signed)
Noted.  Keep follow up then and sooner if any recurrent symptoms in meantime

## 2011-12-28 NOTE — Telephone Encounter (Signed)
Spoke with pt, Aware of dr crenshaw's recommendations.  °

## 2011-12-28 NOTE — Telephone Encounter (Signed)
Reviewed Olga Millers

## 2011-12-28 NOTE — Telephone Encounter (Signed)
Will forward for dr crenshaw review  

## 2012-01-08 ENCOUNTER — Ambulatory Visit (INDEPENDENT_AMBULATORY_CARE_PROVIDER_SITE_OTHER): Payer: Medicare Other | Admitting: Family Medicine

## 2012-01-08 VITALS — BP 130/84 | HR 71 | Temp 97.9°F | Resp 18 | Ht 62.25 in | Wt 170.2 lb

## 2012-01-08 DIAGNOSIS — R05 Cough: Secondary | ICD-10-CM

## 2012-01-08 DIAGNOSIS — J310 Chronic rhinitis: Secondary | ICD-10-CM

## 2012-01-08 DIAGNOSIS — R059 Cough, unspecified: Secondary | ICD-10-CM

## 2012-01-08 DIAGNOSIS — J069 Acute upper respiratory infection, unspecified: Secondary | ICD-10-CM

## 2012-01-08 MED ORDER — HYDROCODONE-HOMATROPINE 5-1.5 MG/5ML PO SYRP: 5.0000 mL | ORAL_SOLUTION | Freq: Every evening | ORAL | Status: DC | PRN

## 2012-01-08 MED ORDER — IPRATROPIUM BROMIDE 0.03 % NA SOLN: 2.0000 | Freq: Two times a day (BID) | NASAL | Status: DC

## 2012-01-08 MED ORDER — BENZONATATE 100 MG PO CAPS: 200.0000 mg | ORAL_CAPSULE | Freq: Two times a day (BID) | ORAL | Status: AC | PRN

## 2012-01-08 NOTE — Progress Notes (Signed)
[facility]:  Office Visit  Chief Complaint:  Chief Complaint  Patient presents with  . Cough  . Sinusitis    HPI: Alexis Banks is a 66 y.o. female who complains of  1 day history of productive cough, until she gags, and is incontinent because of it. Tried mucinex and ricola without relief. Denies fevers, chills, sinus pressure, sore thorat. Has had sick contact with grandadaughter who has bilateral OM. She currently does not have any sinus issues, just cough.   Past Medical History  Diagnosis Date  . Hyperlipidemia   . Hypertension   . CORONARY ARTERY DISEASE   . VARICOSE VEINS, LOWER EXTREMITIES   . HYPOTHYROIDISM   . VITAMIN D DEFICIENCY   . ANXIETY DEPRESSION   . GERD   . ROTATOR CUFF SYNDROME, RIGHT   . INSOMNIA   . Trochanteric bursitis of left hip    Past Surgical History  Procedure Date  . Cholecystectomy   . Rotator cuff surgery    History   Social History  . Marital Status: Married    Spouse Name: N/A    Number of Children: 3  . Years of Education: N/A   Occupational History  .      Retired   Social History Main Topics  . Smoking status: Never Smoker   . Smokeless tobacco: Never Used  . Alcohol Use: No  . Drug Use: No  . Sexually Active: No   Other Topics Concern  . None   Social History Narrative  . None   Family History  Problem Relation Age of Onset  . Heart disease      No family history of  . COPD Father   . Alcohol abuse Mother    Allergies  Allergen Reactions  . Adhesive (Tape) Rash    Rubbery tape - blisters   Prior to Admission medications   Medication Sig Start Date End Date Taking? Authorizing Provider  aspirin 81 MG tablet Take 81 mg by mouth daily.     Yes Historical Provider, MD  benazepril (LOTENSIN) 20 MG tablet Take 20 mg by mouth daily. 08/09/11  Yes Kristian Covey, MD  Calcium-Vitamin D (CALTRATE 600 PLUS-VIT D PO) Take by mouth daily.     Yes Historical Provider, MD  levothyroxine  (SYNTHROID, LEVOTHROID) 137 MCG tablet Take 137 mcg by mouth daily.   Yes Historical Provider, MD  lovastatin (MEVACOR) 40 MG tablet 1 tab hs for hyperlipidemia 08/09/11  Yes Kristian Covey, MD  Multiple Vitamin (MULTIVITAMIN) tablet Take 1 tablet by mouth daily.     Yes Historical Provider, MD  nitroGLYCERIN (NITROSTAT) 0.4 MG SL tablet Place 1 tablet (0.4 mg total) under the tongue every 5 (five) minutes as needed for chest pain. 04/22/11 04/21/12 Yes Lewayne Bunting, MD  OMEGA 3-6-9 FATTY ACIDS PO Take by mouth daily.     Yes Historical Provider, MD  omeprazole (PRILOSEC) 40 MG capsule Take 1 capsule (40 mg total) by mouth daily. 08/09/11  Yes Kristian Covey, MD  propranolol (INDERAL) 20 MG tablet Take 1 tablet (20 mg total) by mouth 2 (two) times daily. 08/09/11  Yes Kristian Covey, MD  Vitamin D, Ergocalciferol, (DRISDOL) 50000 UNITS CAPS Take 1 capsule (50,000 Units total) by mouth every 7 (seven) days. 08/10/11  Yes Kristian Covey, MD  Coenzyme Q10 (COQ10) 150 MG CAPS Take 150 mg by mouth daily.      Historical Provider, MD  zolpidem (AMBIEN) 10 MG  tablet Take 1 tablet (10 mg total) by mouth at bedtime as needed for sleep. 09/14/10 10/14/10  Damian Leavell., MD     ROS: The patient denies fevers, chills, night sweats, unintentional weight loss, chest pain, palpitations, wheezing, dyspnea on exertion, nausea, vomiting, abdominal pain, dysuria, hematuria, melena, numbness, weakness, or tingling.  All other systems have been reviewed and were otherwise negative with the exception of those mentioned in the HPI and as above.    PHYSICAL EXAM: Filed Vitals:   01/08/12 0919  BP: 130/84  Pulse: 71  Temp: 97.9 F (36.6 C)  Resp: 18   Filed Vitals:   01/08/12 0919  Height: 5' 2.25" (1.581 m)  Weight: 170 lb 3.2 oz (77.202 kg)   Body mass index is 30.88 kg/(m^2).  General: Alert, no acute distress HEENT:  Normocephalic, atraumatic, oropharynx patent. TM nl, no exudates,+  PND, no sinus tenderness Cardiovascular:  Regular rate and rhythm, no rubs murmurs or gallops.  No Carotid bruits, radial pulse intact. No pedal edema.  Respiratory: Clear to auscultation bilaterally.  No wheezes, rales, or rhonchi.  No cyanosis, no use of accessory musculature GI: No organomegaly, abdomen is soft and non-tender, positive bowel sounds.  No masses. Skin: No rashes. Neurologic: Facial musculature symmetric. Psychiatric: Patient is appropriate throughout our interaction. Lymphatic: No cervical lymphadenopathy Musculoskeletal: Gait intact.   LABS: Results for orders placed during the hospital encounter of 12/25/11  BASIC METABOLIC PANEL      Component Value Range   Sodium 138  135 - 145 mEq/L   Potassium 3.9  3.5 - 5.1 mEq/L   Chloride 102  96 - 112 mEq/L   CO2 23  19 - 32 mEq/L   Glucose, Bld 138 (*) 70 - 99 mg/dL   BUN 21  6 - 23 mg/dL   Creatinine, Ser 4.09  0.50 - 1.10 mg/dL   Calcium 9.9  8.4 - 81.1 mg/dL   GFR calc non Af Amer 62 (*) >90 mL/min   GFR calc Af Amer 72 (*) >90 mL/min  GLUCOSE, CAPILLARY      Component Value Range   Glucose-Capillary 127 (*) 70 - 99 mg/dL  TROPONIN I      Component Value Range   Troponin I <0.30  <0.30 ng/mL  CBC WITH DIFFERENTIAL      Component Value Range   WBC 6.2  4.0 - 10.5 K/uL   RBC 4.52  3.87 - 5.11 MIL/uL   Hemoglobin 12.5  12.0 - 15.0 g/dL   HCT 91.4  78.2 - 95.6 %   MCV 85.2  78.0 - 100.0 fL   MCH 27.7  26.0 - 34.0 pg   MCHC 32.5  30.0 - 36.0 g/dL   RDW 21.3  08.6 - 57.8 %   Platelets 252  150 - 400 K/uL   Neutrophils Relative 37 (*) 43 - 77 %   Neutro Abs 2.3  1.7 - 7.7 K/uL   Lymphocytes Relative 53 (*) 12 - 46 %   Lymphs Abs 3.3  0.7 - 4.0 K/uL   Monocytes Relative 8  3 - 12 %   Monocytes Absolute 0.5  0.1 - 1.0 K/uL   Eosinophils Relative 3  0 - 5 %   Eosinophils Absolute 0.2  0.0 - 0.7 K/uL   Basophils Relative 0  0 - 1 %   Basophils Absolute 0.0  0.0 - 0.1 K/uL  TROPONIN I      Component Value Range    Troponin I <  0.30  <0.30 ng/mL  TROPONIN I      Component Value Range   Troponin I <0.30  <0.30 ng/mL  TROPONIN I      Component Value Range   Troponin I <0.30  <0.30 ng/mL  HEMOGLOBIN A1C      Component Value Range   Hemoglobin A1C 6.1 (*) <5.7 %   Mean Plasma Glucose 128 (*) <117 mg/dL  LIPID PANEL      Component Value Range   Cholesterol 129  0 - 200 mg/dL   Triglycerides 96  <595 mg/dL   HDL 50  >63 mg/dL   Total CHOL/HDL Ratio 2.6     VLDL 19  0 - 40 mg/dL   LDL Cholesterol 60  0 - 99 mg/dL  TSH      Component Value Range   TSH 0.109 (*) 0.350 - 4.500 uIU/mL  MAGNESIUM      Component Value Range   Magnesium 1.9  1.5 - 2.5 mg/dL     EKG/XRAY:   Primary read interpreted by Dr. Conley Rolls at [facility].   ASSESSMENT/PLAN: Encounter Diagnoses  Name Primary?  . Cough Yes  . URI, acute   . Rhinitis    Rx Atrovent NS Rx Tessalon Perles Rx Hydromet syrup F/u prn   Alexis Vohra PHUONG, DO 01/08/2012 10:09 AM

## 2012-01-08 NOTE — Patient Instructions (Signed)
Cough, Adult   A cough is a reflex. It helps you clear your throat and airways. A cough can help heal your body. A cough can last 2 or 3 weeks (acute) or may last more than 8 weeks (chronic). Some common causes of a cough can include an infection, allergy, or a cold.  HOME CARE   Only take medicine as told by your doctor.   If given, take your medicines (antibiotics) as told. Finish them even if you start to feel better.   Use a cold steam vaporizer or humidier in your home. This can help loosen thick spit (secretions).   Sleep so you are almost sitting up (semi-upright). Use pillows to do this. This helps reduce coughing.   Rest as needed.   Stop smoking if you smoke.  GET HELP RIGHT AWAY IF:   You have yellowish-white fluid (pus) in your thick spit.   Your cough gets worse.   Your medicine does not reduce coughing, and you are losing sleep.   You cough up blood.   You have trouble breathing.   Your pain gets worse and medicine does not help.   You have a fever.  MAKE SURE YOU:    Understand these instructions.   Will watch your condition.   Will get help right away if you are not doing well or get worse.  Document Released: 09/17/2010 Document Revised: 03/29/2011 Document Reviewed: 09/17/2010  [employer] Patient Information 2013 [employer].

## 2012-01-10 ENCOUNTER — Other Ambulatory Visit: Payer: Self-pay | Admitting: Family Medicine

## 2012-01-10 ENCOUNTER — Telehealth: Payer: Self-pay

## 2012-01-10 MED ORDER — AMOXICILLIN 500 MG PO CAPS: 500.0000 mg | ORAL_CAPSULE | Freq: Two times a day (BID) | ORAL | Status: DC

## 2012-01-10 NOTE — Telephone Encounter (Signed)
Spoke with patient worsening sxs, mor pressure around eyes. Call in abx for acute sinusistis, amox 500 mg BID x 10 days

## 2012-01-10 NOTE — Telephone Encounter (Signed)
PT STATES SHE HAD SEEN DR LE AND WAS TOLD TO CALL BACK IF NO BETTER AND SHE ISN'T MUCH BETTER AT ALL PLEASE CALL [phone]

## 2012-01-10 NOTE — Telephone Encounter (Signed)
Called patient back, she is congested has cough and headache, she is using cough meds and nasal spray. Please advise.

## 2012-01-13 ENCOUNTER — Ambulatory Visit (INDEPENDENT_AMBULATORY_CARE_PROVIDER_SITE_OTHER): Payer: Medicare Other | Admitting: Family Medicine

## 2012-01-13 VITALS — BP 108/60 | HR 64 | Temp 98.4°F | Ht 62.0 in | Wt 167.0 lb

## 2012-01-13 DIAGNOSIS — R062 Wheezing: Secondary | ICD-10-CM

## 2012-01-13 DIAGNOSIS — R05 Cough: Secondary | ICD-10-CM

## 2012-01-13 DIAGNOSIS — J069 Acute upper respiratory infection, unspecified: Secondary | ICD-10-CM

## 2012-01-13 DIAGNOSIS — R059 Cough, unspecified: Secondary | ICD-10-CM

## 2012-01-13 DIAGNOSIS — E039 Hypothyroidism, unspecified: Secondary | ICD-10-CM

## 2012-01-13 MED ORDER — HYDROCODONE-HOMATROPINE 5-1.5 MG/5ML PO SYRP: 5.0000 mL | ORAL_SOLUTION | Freq: Four times a day (QID) | ORAL | Status: DC | PRN

## 2012-01-13 MED ORDER — METHYLPREDNISOLONE ACETATE 80 MG/ML IJ SUSP
80.0000 mg | Freq: Once | INTRAMUSCULAR | Status: AC
  Administered 2012-01-13: 80 mg via INTRAMUSCULAR

## 2012-01-13 MED ORDER — LEVOTHYROXINE SODIUM 125 MCG PO TABS: 125.0000 ug | ORAL_TABLET | Freq: Every day | ORAL | Status: DC

## 2012-01-13 NOTE — Patient Instructions (Addendum)
Finish out antibiotic.   Follow up promptly for any fever or increased shortness of breath.

## 2012-01-13 NOTE — Progress Notes (Signed)
  Subjective:    Patient ID: Alexis Banks, female    DOB: 07-10-1945, 66 y.o.   MRN: [id number]  HPI  Patient seen with productive cough. Onset around 01/07/2012 productive cough. Took some Mucinex without much improvement. Went to urgent care Center and assessed with probable viral upper respiratory infection. She was prescribed Tessalon Perles and hydrocodone cough syrup along with Atrovent nasal spray without much improvement. Hydromet cough syrup did help her cough somewhat at night. She was subsequently called in amoxicillin which is currently taking. She has never had any fever with this. No history of smoking. No pleuritic pain. No hemoptysis. She's had some slight wheezing off and on.  Patient had brief syncopal episode and had extensive hospital workup unrevealing. TSH was low. There was no adjustment made in her levothyroxin- currently 137 mcg daily.  Past Medical History  Diagnosis Date  . Hyperlipidemia   . Hypertension   . CORONARY ARTERY DISEASE   . VARICOSE VEINS, LOWER EXTREMITIES   . HYPOTHYROIDISM   . VITAMIN D DEFICIENCY   . ANXIETY DEPRESSION   . GERD   . ROTATOR CUFF SYNDROME, RIGHT   . INSOMNIA   . Trochanteric bursitis of left hip    Past Surgical History  Procedure Date  . Cholecystectomy   . Rotator cuff surgery     reports that she has never smoked. She has never used smokeless tobacco. She reports that she does not drink alcohol or use illicit drugs. family history includes Alcohol abuse in her mother; COPD in her father; and Heart disease in an unspecified family member. Allergies  Allergen Reactions  . Adhesive (Tape) Rash    Rubbery tape - blisters      Review of Systems  Constitutional: Positive for fatigue. Negative for fever and chills.  Respiratory: Positive for cough and wheezing. Negative for shortness of breath.   Cardiovascular: Negative for chest pain and leg swelling.  Genitourinary: Negative for dysuria.  Neurological: Negative for  dizziness and weakness.  Psychiatric/Behavioral: Negative for dysphoric mood.       Objective:   Physical Exam  Constitutional: She appears well-developed and well-nourished.       Coughing off and on during exam but in no respiratory distress  HENT:  Right Ear: External ear normal.  Left Ear: External ear normal.  Mouth/Throat: Oropharynx is clear and moist.  Neck: Neck supple. No thyromegaly present.  Cardiovascular: Normal rate and regular rhythm.   Pulmonary/Chest: Effort normal and breath sounds normal.       Patient has a few faint expiratory wheezes. No rales.  Musculoskeletal: She exhibits no edema.  Lymphadenopathy:    She has no cervical adenopathy.          Assessment & Plan:  Persistent cough with evidence for mild reactive airway component. Pulse oximetry 96-97%. Depo-Medrol 80 mg IM given. Refill Hydromet cough syrup. Finish out amoxicillin, though doubt this is bacterial  Hypothyroidism. Remains over replaced by recent labs. Decrease levothyroxine to 125 mcg daily and repeat TSH in 3-4 months

## 2012-01-18 ENCOUNTER — Ambulatory Visit (HOSPITAL_COMMUNITY)
Admission: RE | Admit: 2012-01-18 | Discharge: 2012-01-18 | Disposition: A | Discharge status: Home / Self Care | Payer: Medicare Other | Source: Ambulatory Visit | Attending: Family Medicine | Admitting: Family Medicine

## 2012-01-18 ENCOUNTER — Ambulatory Visit: Payer: Medicare Other | Admitting: Family Medicine

## 2012-01-18 DIAGNOSIS — Z1231 Encounter for screening mammogram for malignant neoplasm of breast: Secondary | ICD-10-CM | POA: Insufficient documentation

## 2012-02-24 ENCOUNTER — Ambulatory Visit (INDEPENDENT_AMBULATORY_CARE_PROVIDER_SITE_OTHER): Payer: Medicare Other | Admitting: Family Medicine

## 2012-02-24 ENCOUNTER — Encounter: Payer: Self-pay | Admitting: Family Medicine

## 2012-02-24 VITALS — BP 140/80 | HR 62 | Wt 169.0 lb

## 2012-02-24 DIAGNOSIS — E8881 Metabolic syndrome: Secondary | ICD-10-CM

## 2012-02-24 DIAGNOSIS — E039 Hypothyroidism, unspecified: Secondary | ICD-10-CM

## 2012-02-24 DIAGNOSIS — E88819 Insulin resistance, unspecified: Secondary | ICD-10-CM | POA: Insufficient documentation

## 2012-02-24 DIAGNOSIS — E785 Hyperlipidemia, unspecified: Secondary | ICD-10-CM

## 2012-02-24 DIAGNOSIS — N3281 Overactive bladder: Secondary | ICD-10-CM

## 2012-02-24 DIAGNOSIS — N318 Other neuromuscular dysfunction of bladder: Secondary | ICD-10-CM

## 2012-02-24 DIAGNOSIS — G4700 Insomnia, unspecified: Secondary | ICD-10-CM

## 2012-02-24 DIAGNOSIS — I10 Essential (primary) hypertension: Secondary | ICD-10-CM

## 2012-02-24 LAB — TSH: TSH: 0.07 u[IU]/mL — ABNORMAL LOW (ref 0.35–5.50)

## 2012-02-24 LAB — BASIC METABOLIC PANEL
BUN: 20 mg/dL (ref 6–23)
CO2: 27 mEq/L (ref 19–32)
Chloride: 103 mEq/L (ref 96–112)
Creatinine, Ser: 1 mg/dL (ref 0.4–1.2)
Potassium: 4 mEq/L (ref 3.5–5.1)

## 2012-02-24 MED ORDER — SOLIFENACIN SUCCINATE 5 MG PO TABS: 5.0000 mg | ORAL_TABLET | Freq: Every day | ORAL | Status: DC

## 2012-02-24 MED ORDER — ZOLPIDEM TARTRATE 10 MG PO TABS: 10.0000 mg | ORAL_TABLET | Freq: Every evening | ORAL | Status: DC | PRN

## 2012-02-24 NOTE — Progress Notes (Signed)
  Subjective:    Patient ID: Alexis Banks, female    DOB: Jul 22, 1945, 67 y.o.   MRN: [id number]  HPI New to establish.  Previous MD- Scotty Court, [place]  GYN- [place]  Cards- [place]  HTN- chronic problem, on Benazepril, propranolol.  No CP, SOB, HAs, visual changes, edema.  Hyperlipidemia- chronic problem, on Lovastatin.  Denies abd pain, N/V, myalgias.  Insulin resistance- A1C was 6.1 during recent hospitalization.  Sister w/ DM, mother, multiple cousins.  Hypothyroid- TSH was low at 0.109 and dose was changed from 175 --> 125 mcg in December.  Denies fatigue, hot/cold intolerance.  Insomnia- pt reports she will fall asleep between 12-1 and wake up daily at 5 am.  If she attempts to fall asleep earlier 'i'll just lay there'.  Pt has previously taken Ambien w/ good results.  Urinary incontinence- pt reports that she will have OAB sxs.  Saw Dr Hillis Range 'years ago' and 'he told me to wear a pad'.  Has not been on meds for OAB.  Reports 'i'm always running'.  Denies leakage w/ coughing and sneezing.   Review of Systems For ROS see HPI     Objective:   Physical Exam  Vitals reviewed. Constitutional: She is oriented to person, place, and time. She appears well-developed and well-nourished. No distress.  HENT:  Head: Normocephalic and atraumatic.  Eyes: Conjunctivae normal and EOM are normal. Pupils are equal, round, and reactive to light.  Neck: Normal range of motion. Neck supple. No thyromegaly present.  Cardiovascular: Normal rate, regular rhythm, normal heart sounds and intact distal pulses.   No murmur heard. Pulmonary/Chest: Effort normal and breath sounds normal. No respiratory distress.  Abdominal: Soft. She exhibits no distension. There is no tenderness.  Musculoskeletal: She exhibits no edema.  Lymphadenopathy:    She has no cervical adenopathy.  Neurological: She is alert and oriented to person, place, and time.  Skin: Skin is warm and dry.  Psychiatric: She has a  normal mood and affect. Her behavior is normal.          Assessment & Plan:

## 2012-02-24 NOTE — Assessment & Plan Note (Signed)
Chronic problem, elevated today.  Asymptomatic.  Suspect pt is nervous about meeting new MD.  Will follow closely and adjust meds prn.

## 2012-02-24 NOTE — Assessment & Plan Note (Signed)
New to provider, ongoing for pt.  Pt's meds were adjusted at last visit.  Repeat labs and adjust meds prn.

## 2012-02-24 NOTE — Assessment & Plan Note (Signed)
New to provider, ongoing for pt.  Previously did well on Ambien.  Discussed possibly starting trazodone but pt prefers sticking w/ something she has already used.  refill for Ambien provided.

## 2012-02-24 NOTE — Patient Instructions (Addendum)
Schedule your complete physical in July- sooner if needed [facility] notify you of your lab results and make any changes if needed Start the Ambien for sleep Call with any questions or concerns Welcome!  We're glad to have you!

## 2012-02-24 NOTE — Assessment & Plan Note (Signed)
New.  Pt's recent A1C showed pre-diabetes/insulin resistance at 6.1  Discussed importance of low carb diet, regular exercise.  Will continue to follow closely.

## 2012-02-24 NOTE — Assessment & Plan Note (Signed)
New to provider, ongoing for pt.  Had recent labs done at hospital that showed good control.  No need to repeat labs today.  Tolerating statin w/out difficulty.  Will continue to follow.

## 2012-02-24 NOTE — Assessment & Plan Note (Signed)
New.  Pt is frustrated by sxs.  Will start low dose anticholinergic.

## 2012-02-25 ENCOUNTER — Telehealth: Payer: Self-pay | Admitting: Family Medicine

## 2012-02-25 NOTE — Telephone Encounter (Signed)
pls call lab results on cell if they come in [phone] home number not working.

## 2012-02-28 ENCOUNTER — Telehealth: Payer: Self-pay | Admitting: *Deleted

## 2012-02-28 MED ORDER — LEVOTHYROXINE SODIUM 100 MCG PO TABS: 100.0000 ug | ORAL_TABLET | Freq: Every day | ORAL | Status: DC

## 2012-02-28 NOTE — Telephone Encounter (Signed)
Spoke with the pt and informed her of recent lab results and note.  Pt understood and agreed,new rx sent to the pharmacy([facility] [address]) by e-script.//AB/CMA

## 2012-02-28 NOTE — Telephone Encounter (Signed)
Message copied by Verdie Shire on Mon Feb 28, 2012 12:17 PM ------      Message from: Sheliah Hatch      Created: Sun Feb 27, 2012  5:01 PM       TSH remains low- needs to decrease synthroid to daily      Remainder of labs look good ------

## 2012-03-06 ENCOUNTER — Ambulatory Visit (INDEPENDENT_AMBULATORY_CARE_PROVIDER_SITE_OTHER): Payer: Medicare Other | Admitting: Emergency Medicine

## 2012-03-06 VITALS — BP 138/84 | HR 80 | Temp 97.8°F | Resp 18 | Ht 61.5 in | Wt 166.0 lb

## 2012-03-06 DIAGNOSIS — J209 Acute bronchitis, unspecified: Secondary | ICD-10-CM

## 2012-03-06 MED ORDER — AZITHROMYCIN 250 MG PO TABS: ORAL_TABLET | ORAL | Status: DC

## 2012-03-06 MED ORDER — HYDROCOD POLST-CHLORPHEN POLST 10-8 MG/5ML PO LQCR: 5.0000 mL | Freq: Two times a day (BID) | ORAL | Status: DC | PRN

## 2012-03-06 NOTE — Progress Notes (Signed)
[facility] [address] [place] [address] [phone]  Date:  03/06/2012   Name:  Alexis Banks   DOB:  1945-02-12   MRN:  [id number]  PCP:  Neena Rhymes, MD    Chief Complaint: Cough   History of Present Illness:  Alexis Banks is a 67 y.o. very pleasant female patient who presents with the following:  Ill since Wednesday with cough. No shortness of breath or wheezing. Cough not productive. No nausea or vomiting.  No rash or stool change.  No fever or chills.  No improvement with OTC medications.  Has post nasal drainage and a sore throat.    Patient Active Problem List  Diagnosis  . HYPOTHYROIDISM  . VITAMIN D DEFICIENCY  . HYPERLIPIDEMIA  . HYPOKALEMIA  . ANXIETY DEPRESSION  . HYPERTENSION  . CORONARY ARTERY DISEASE  . VARICOSE VEINS, LOWER EXTREMITIES  . GERD  . ROTATOR CUFF SYNDROME, RIGHT  . LEG CRAMPS  . INSOMNIA  . Trochanteric bursitis of left hip  . Syncope  . Ventricular ectopy  . Insulin resistance  . OAB (overactive bladder)    Past Medical History  Diagnosis Date  . Hyperlipidemia   . Hypertension   . CORONARY ARTERY DISEASE   . VARICOSE VEINS, LOWER EXTREMITIES   . HYPOTHYROIDISM   . VITAMIN D DEFICIENCY   . ANXIETY DEPRESSION   . GERD   . ROTATOR CUFF SYNDROME, RIGHT   . INSOMNIA   . Trochanteric bursitis of left hip     Past Surgical History  Procedure Laterality Date  . Cholecystectomy    . Rotator cuff surgery      History  Substance Use Topics  . Smoking status: Never Smoker   . Smokeless tobacco: Never Used  . Alcohol Use: No    Family History  Problem Relation Age of Onset  . Heart disease      No family history of  . COPD Father   . Alcohol abuse Mother     Allergies  Allergen Reactions  . Adhesive (Tape) Rash    Rubbery tape - blisters    Medication list has been reviewed and updated.  Current Outpatient Prescriptions on File Prior to Visit  Medication Sig Dispense Refill  .  aspirin 81 MG tablet Take 81 mg by mouth daily.        . benazepril (LOTENSIN) 20 MG tablet Take 20 mg by mouth daily.      . Calcium-Vitamin D (CALTRATE 600 PLUS-VIT D PO) Take by mouth daily.        . Coenzyme Q10 (COQ10) 150 MG CAPS Take 150 mg by mouth daily.        Marland Kitchen ipratropium (ATROVENT) 0.03 % nasal spray Place 2 sprays into the nose every 12 (twelve) hours.  30 mL  12  . levothyroxine (SYNTHROID) 100 MCG tablet Take 1 tablet (100 mcg total) by mouth daily.  30 tablet  5  . lovastatin (MEVACOR) 40 MG tablet 1 tab hs for hyperlipidemia  90 tablet  3  . Multiple Vitamin (MULTIVITAMIN) tablet Take 1 tablet by mouth daily.        . nitroGLYCERIN (NITROSTAT) 0.4 MG SL tablet Place 1 tablet (0.4 mg total) under the tongue every 5 (five) minutes as needed for chest pain.  25 tablet  12  . OMEGA 3-6-9 FATTY ACIDS PO Take by mouth daily.        Marland Kitchen omeprazole (PRILOSEC) 40 MG capsule Take 1  capsule (40 mg total) by mouth daily.  90 capsule  3  . propranolol (INDERAL) 20 MG tablet Take 1 tablet (20 mg total) by mouth 2 (two) times daily.  180 tablet  3  . solifenacin (VESICARE) 5 MG tablet Take 1 tablet (5 mg total) by mouth daily.  30 tablet  3  . Vitamin D, Ergocalciferol, (DRISDOL) 50000 UNITS CAPS Take 1 capsule (50,000 Units total) by mouth every 7 (seven) days.  90 capsule  0  . zolpidem (AMBIEN) 10 MG tablet Take 1 tablet (10 mg total) by mouth at bedtime as needed for sleep.  30 tablet  3  . amoxicillin (AMOXIL) 500 MG capsule Take 1 capsule (500 mg total) by mouth 2 (two) times daily.  20 capsule  0  . HYDROcodone-homatropine (HYCODAN) 5-1.5 MG/5ML syrup Take 5 mLs by mouth every 6 (six) hours as needed for cough.  120 mL  0  . zolpidem (AMBIEN) 10 MG tablet Take 1 tablet (10 mg total) by mouth at bedtime as needed for sleep.  30 tablet  5  . [DISCONTINUED] colestipol (COLESTID) 1 G tablet Take 1 g by mouth daily.         No current facility-administered medications on file prior to visit.     Review of Systems:  As per HPI, otherwise negative.    Physical Examination: Filed Vitals:   03/06/12 1657  BP: 138/84  Pulse: 80  Temp: 97.8 F (36.6 C)  Resp: 18   Filed Vitals:   03/06/12 1657  Height: 5' 1.5" (1.562 m)  Weight: 166 lb (75.297 kg)   Body mass index is 30.86 kg/(m^2). Ideal Body Weight: Weight in (lb) to have BMI = 25: 134.2  GEN: WDWN, NAD, Non-toxic, A & O x 3 HEENT: Atraumatic, Normocephalic. Neck supple. No masses, No LAD. Ears and Nose: No external deformity. CV: RRR, No M/G/R. No JVD. No thrill. No extra heart sounds. PULM: CTA B, no wheezes, crackles, rhonchi. No retractions. No resp. distress. No accessory muscle use. ABD: S, NT, ND, +BS. No rebound. No HSM. EXTR: No c/c/e NEURO Normal gait.  PSYCH: Normally interactive. Conversant. Not depressed or anxious appearing.  Calm demeanor.    Assessment and Plan: Bronchitis zpak tussionex Follow up as needed  Carmelina Dane, MD

## 2012-03-06 NOTE — Patient Instructions (Signed)

## 2012-03-10 ENCOUNTER — Telehealth: Payer: Self-pay

## 2012-03-10 NOTE — Telephone Encounter (Signed)
PT STATES SHE WAS GIVEN A Z-PAK ALONG WITH SOME C0UGH MEDICINE. WANTED TO KNOW IF SHE SHOULD BE FEELING BETTER, BECAUSE SHE ISN'T PLEASE CALL [phone]

## 2012-03-10 NOTE — Telephone Encounter (Signed)
Agree.  RTC if worsening

## 2012-03-10 NOTE — Telephone Encounter (Signed)
Is she worse? Or just not better? Called her and she is not getting worse, she states she is just not 100% yet. I advised her the Z pack will continue to work even though she has finished this medication. I also advised her to go on Mucinex for her continued c/o cough, she will do this. She also will try Zyrtec or Claritin. She was instructed to return if her cough becomes more severe, or if her fever increases. She agrees to plan and will call me back if she has any further questions Amy To you FYI

## 2012-04-03 ENCOUNTER — Telehealth: Payer: Self-pay | Admitting: *Deleted

## 2012-04-03 MED ORDER — TOLTERODINE TARTRATE ER 4 MG PO CP24: 4.0000 mg | ORAL_CAPSULE | Freq: Every day | ORAL | Status: DC

## 2012-04-03 NOTE — Telephone Encounter (Signed)
Rx faxed and the patient has been made aware.      KP//CMA

## 2012-04-03 NOTE — Telephone Encounter (Signed)
Pt states that the vesicare is working great however she is unable to afford med. Pt would like to know if there is a generic or cheaper med that she can take. Marland KitchenPlease advise

## 2012-04-03 NOTE — Telephone Encounter (Signed)
detrol la 4 mg 30 1 po qd  2 refills

## 2012-04-04 NOTE — Telephone Encounter (Signed)
Patient went to pick up the detrol & it cost triple. The patient needs a medication in tier 1 or 2. Contact patient if you have any questions [phone]. She uses [facility] on [address].

## 2012-04-10 ENCOUNTER — Telehealth: Payer: Self-pay | Admitting: Family Medicine

## 2012-04-10 NOTE — Telephone Encounter (Signed)
Please see previous note from 04/03/12  Patient states that she hasn't heard back from anyone regarding the medication.

## 2012-04-10 NOTE — Telephone Encounter (Signed)
Detailed message left advising the Detrol LA was sent to the pharmacy and she will need to call to see if the refill is ready, If not we will fax again.     KP//CMA

## 2012-04-13 ENCOUNTER — Ambulatory Visit: Payer: Medicare Other | Admitting: Family Medicine

## 2012-04-25 ENCOUNTER — Encounter: Payer: Self-pay | Admitting: Lab

## 2012-04-26 ENCOUNTER — Encounter: Payer: Self-pay | Admitting: Family Medicine

## 2012-04-26 ENCOUNTER — Ambulatory Visit (INDEPENDENT_AMBULATORY_CARE_PROVIDER_SITE_OTHER): Payer: Medicare Other | Admitting: Family Medicine

## 2012-04-26 VITALS — BP 124/90 | HR 60 | Temp 97.6°F | Ht 61.75 in | Wt 172.8 lb

## 2012-04-26 DIAGNOSIS — M25659 Stiffness of unspecified hip, not elsewhere classified: Secondary | ICD-10-CM

## 2012-04-26 DIAGNOSIS — N318 Other neuromuscular dysfunction of bladder: Secondary | ICD-10-CM

## 2012-04-26 DIAGNOSIS — N3281 Overactive bladder: Secondary | ICD-10-CM

## 2012-04-26 DIAGNOSIS — M2560 Stiffness of unspecified joint, not elsewhere classified: Secondary | ICD-10-CM | POA: Insufficient documentation

## 2012-04-26 MED ORDER — OXYBUTYNIN CHLORIDE 5 MG PO TABS: 5.0000 mg | ORAL_TABLET | Freq: Two times a day (BID) | ORAL | Status: DC

## 2012-04-26 NOTE — Progress Notes (Signed)
  Subjective:    Patient ID: Alexis Banks, female    DOB: 09-01-1945, 67 y.o.   MRN: [id number]  HPI Medication advice- pt brought all meds and bottles to know when she should be taking meds.  OAB- pt is having excellent results on Vesicare 5mg .  Could not afford copay.  Sent script for Detrol LA and 'the copay was $87!'  Pt was told that she needs to use a Tier 1 med- only option is Oxybutynin 5 mg BID.  Thigh stiffness- pt reports she is having stiffness in thighs when rising from a seated position.  Pain and stiffness improves w/ advil.  If sitting on the floor is unable to get up w/out a support.  No blurry vision.   Review of Systems For ROS see HPI     Objective:   Physical Exam  Vitals reviewed. Constitutional: She is oriented to person, place, and time. She appears well-developed and well-nourished. No distress.  HENT:  Head: Normocephalic and atraumatic.  Eyes: Conjunctivae and EOM are normal. Pupils are equal, round, and reactive to light.  Neck: Normal range of motion. Neck supple. No thyromegaly present.  Cardiovascular: Normal rate, regular rhythm, normal heart sounds and intact distal pulses.   No murmur heard. Pulmonary/Chest: Effort normal and breath sounds normal. No respiratory distress.  Abdominal: Soft. She exhibits no distension. There is no tenderness.  Musculoskeletal: She exhibits no edema.  Lymphadenopathy:    She has no cervical adenopathy.  Neurological: She is alert and oriented to person, place, and time. She has normal reflexes. No cranial nerve deficit. She exhibits normal muscle tone. Coordination normal.  Skin: Skin is warm and dry.  Psychiatric: She has a normal mood and affect. Her behavior is normal.          Assessment & Plan:

## 2012-04-26 NOTE — Assessment & Plan Note (Signed)
New.  Pt reports sxs improvement w/ ibuprofen.  May be arthritis but given bilateral thigh stiffness must consider PMR.  If no improvement or sxs worsen will get ESR and refer to rheum for complete evaluation.  Pt expressed understanding and is in agreement w/ plan.

## 2012-04-26 NOTE — Patient Instructions (Addendum)
Follow up in 1-2 months to see how the new bladder medicine is working and if the stiffness improved Start the Oxybutynin twice daily (bladder) Continue the ibuprofen (Advil) as needed Call with any questions or concerns Hang in there!

## 2012-04-26 NOTE — Assessment & Plan Note (Signed)
Ongoing issue for pt.  Formulary is requiring pt to switch to Tier 1 medication and only option is generic oxybutynin.  Med changed.  Cautioned pt on possible side effects.  Pt expressed understanding and is in agreement w/ plan.

## 2012-07-24 ENCOUNTER — Encounter: Payer: Self-pay | Admitting: Family Medicine

## 2012-07-24 ENCOUNTER — Ambulatory Visit (INDEPENDENT_AMBULATORY_CARE_PROVIDER_SITE_OTHER): Payer: Medicare Other | Admitting: Family Medicine

## 2012-07-24 VITALS — BP 130/78 | HR 64 | Temp 98.1°F | Ht 61.5 in | Wt 171.6 lb

## 2012-07-24 DIAGNOSIS — I10 Essential (primary) hypertension: Secondary | ICD-10-CM

## 2012-07-24 DIAGNOSIS — E039 Hypothyroidism, unspecified: Secondary | ICD-10-CM

## 2012-07-24 DIAGNOSIS — IMO0001 Reserved for inherently not codable concepts without codable children: Secondary | ICD-10-CM

## 2012-07-24 DIAGNOSIS — E785 Hyperlipidemia, unspecified: Secondary | ICD-10-CM

## 2012-07-24 DIAGNOSIS — Z0000 Encounter for general adult medical examination without abnormal findings: Secondary | ICD-10-CM

## 2012-07-24 DIAGNOSIS — M791 Myalgia, unspecified site: Secondary | ICD-10-CM

## 2012-07-24 DIAGNOSIS — F341 Dysthymic disorder: Secondary | ICD-10-CM

## 2012-07-24 DIAGNOSIS — E559 Vitamin D deficiency, unspecified: Secondary | ICD-10-CM

## 2012-07-24 LAB — BASIC METABOLIC PANEL
CO2: 31 mEq/L (ref 19–32)
Calcium: 9.4 mg/dL (ref 8.4–10.5)
Creatinine, Ser: 1 mg/dL (ref 0.4–1.2)
GFR: 57.86 mL/min — ABNORMAL LOW (ref >60.00)
Glucose, Bld: 90 mg/dL (ref 70–99)
Sodium: 138 mEq/L (ref 135–145)

## 2012-07-24 LAB — CBC WITH DIFFERENTIAL/PLATELET
Basophils Absolute: 0.1 10*3/uL (ref 0.0–0.1)
Eosinophils Relative: 3.4 % (ref 0.0–5.0)
HCT: 37.7 % (ref 36.0–46.0)
Hemoglobin: 12.4 g/dL (ref 12.0–15.0)
Lymphs Abs: 1.9 10*3/uL (ref 0.7–4.0)
MCV: 87.3 fl (ref 78.0–100.0)
Monocytes Absolute: 0.4 10*3/uL (ref 0.1–1.0)
Monocytes Relative: 7 % (ref 3.0–12.0)
Neutro Abs: 3.4 10*3/uL (ref 1.4–7.7)
Platelets: 223 10*3/uL (ref 150.0–400.0)
RDW: 16.7 % — ABNORMAL HIGH (ref 11.5–14.6)

## 2012-07-24 LAB — LIPID PANEL
Cholesterol: 175 mg/dL (ref 0–200)
HDL: 61.7 mg/dL (ref >39.00)
Triglycerides: 131 mg/dL (ref 0.0–149.0)

## 2012-07-24 LAB — HEPATIC FUNCTION PANEL
Albumin: 4.1 g/dL (ref 3.5–5.2)
Total Protein: 7.8 g/dL (ref 6.0–8.3)

## 2012-07-24 LAB — CK: Total CK: 130 U/L (ref 7–177)

## 2012-07-24 MED ORDER — OXYBUTYNIN CHLORIDE 5 MG PO TABS: 5.0000 mg | ORAL_TABLET | Freq: Two times a day (BID) | ORAL | Status: DC

## 2012-07-24 MED ORDER — ZOLPIDEM TARTRATE 10 MG PO TABS: 10.0000 mg | ORAL_TABLET | Freq: Every evening | ORAL | Status: DC | PRN

## 2012-07-24 MED ORDER — ALPRAZOLAM 0.5 MG PO TABS: ORAL_TABLET | ORAL | Status: DC

## 2012-07-24 NOTE — Assessment & Plan Note (Signed)
Pt's PE WNL.  UTD on health maintenance.  Check labs.  Anticipatory guidance provided.  

## 2012-07-24 NOTE — Assessment & Plan Note (Signed)
Check labs.  Replete prn. 

## 2012-07-24 NOTE — Assessment & Plan Note (Signed)
Chronic problem.  Tolerating statin w/out difficulty.  Check labs.  Adjust meds prn  

## 2012-07-24 NOTE — Progress Notes (Signed)
  Subjective:    Patient ID: Alexis Banks, female    DOB: September 22, 1945, 67 y.o.   MRN: [id number]  HPI Here today for CPE.  Risk Factors: HTN- chronic problem, on Benazepril, propranolol.  No CP, SOB, HAs, visual changes, edema. Hyperlipidemia- chronic problem, on Lovastatin.  Denies abd pain, N/V. Myalgias- L upper arm and L thigh, occurs w/ movement.  Improves w/ heat- 'it goes away for a couple days'.  Limited in her ability to lift w/ L arm. Physical Activity: very active in caring for great-granddaughter (2 yrs old) Fall Risk: low risk Depression: no current sxs but pt fears what will happen when husband retires at end of month.  Pt asking for med to 'deal w/ it if it gets too hot at home' Hearing: normal to conversational tones, whispered voice at 6 ft ADL's: independent Cognitive: normal linear thought process, memory and attention intact Home Safety: safe at home, lives w/ husband Height, Weight, BMI, Visual Acuity: see vitals, vision corrected to 20/20 w/ glasses Counseling: UTD on colonoscopy, GYN (mammo, DEXA) Labs Ordered: See A&P Care Plan: See A&P    Review of Systems Patient reports no vision/ hearing changes, adenopathy,fever, weight change,  persistant/recurrent hoarseness , swallowing issues, chest pain, palpitations, edema, persistant/recurrent cough, hemoptysis, dyspnea (rest/exertional/paroxysmal nocturnal), gastrointestinal bleeding (melena, rectal bleeding), abdominal pain, significant heartburn, bowel changes, GU symptoms (dysuria, hematuria, incontinence), Gyn symptoms (abnormal  bleeding, pain),  syncope, focal weakness, memory loss, numbness & tingling, skin/hair/nail changes, abnormal bruising or bleeding, anxiety, or depression.     Objective:   Physical Exam General Appearance:    Alert, cooperative, no distress, appears stated age  Head:    Normocephalic, without obvious abnormality, atraumatic  Eyes:    PERRL, conjunctiva/corneas clear, EOM's intact, fundi     benign, both eyes  Ears:    Normal TM's and external ear canals, both ears  Nose:   Nares normal, septum midline, mucosa normal, no drainage    or sinus tenderness  Throat:   Lips, mucosa, and tongue normal; teeth and gums normal  Neck:   Supple, symmetrical, trachea midline, no adenopathy;    Thyroid: no enlargement/tenderness/nodules  Back:     Symmetric, no curvature, ROM normal, no CVA tenderness  Lungs:     Clear to auscultation bilaterally, respirations unlabored  Chest Wall:    No tenderness or deformity   Heart:    Regular rate and rhythm, S1 and S2 normal, no murmur, rub   or gallop  Breast Exam:    Deferred to GYN  Abdomen:     Soft, non-tender, bowel sounds active all four quadrants,    no masses, no organomegaly  Genitalia:    Deferred to GYN  Rectal:    Extremities:   Extremities normal, atraumatic, no cyanosis or edema  Pulses:   2+ and symmetric all extremities  Skin:   Skin color, texture, turgor normal, no rashes or lesions  Lymph nodes:   Cervical, supraclavicular, and axillary nodes normal  Neurologic:   CNII-XII intact, normal strength, sensation and reflexes    throughout          Assessment & Plan:

## 2012-07-24 NOTE — Assessment & Plan Note (Signed)
Chronic problem.  meds were adjusted at last visit.  Repeat TSH and adjust dose prn.

## 2012-07-24 NOTE — Assessment & Plan Note (Signed)
Chronic problem.  Currently well controlled but pt fears what will happen when husband retires.  Start low dose xanax prn.  Will follow.

## 2012-07-24 NOTE — Assessment & Plan Note (Signed)
Chronic problem.  Well controlled today.  Asymptomatic. 

## 2012-07-24 NOTE — Patient Instructions (Addendum)
Follow up in 6 months to recheck BP and cholesterol We'll notify you of your lab results and make any changes if needed HOLD the Lovastatin x2 weeks and see if muscle aches improve Take the Alprazolam as needed for anxiety/stress Call with any questions or concerns Keep up the good work!  You look great! Have a great summer!

## 2012-07-24 NOTE — Assessment & Plan Note (Signed)
New.  Pt reports muscle aches on L side of body- particularly upper arm and thigh.  Check CK.  Hold statin x2 weeks and see if sxs improve.  Pt expressed understanding and is in agreement w/ plan.

## 2012-07-27 ENCOUNTER — Telehealth: Payer: Self-pay

## 2012-07-27 MED ORDER — LEVOTHYROXINE SODIUM 112 MCG PO TABS: 112.0000 ug | ORAL_TABLET | Freq: Every day | ORAL | Status: DC

## 2012-07-27 NOTE — Telephone Encounter (Signed)
New prescription for Synthroid sent to [facility]. GF/RN

## 2012-07-28 LAB — VITAMIN D 1,25 DIHYDROXY: Vitamin D2 1, 25 (OH)2: <8 pg/mL

## 2012-08-09 ENCOUNTER — Telehealth: Payer: Self-pay | Admitting: Family Medicine

## 2012-08-09 NOTE — Telephone Encounter (Signed)
Patient is calling to follow-up on her lab results from 07/24/12 and also has questions about the new medication she was recently prescribed.

## 2012-08-10 ENCOUNTER — Other Ambulatory Visit: Payer: Self-pay

## 2012-08-10 MED ORDER — BENAZEPRIL HCL 20 MG PO TABS: 20.0000 mg | ORAL_TABLET | Freq: Every day | ORAL | Status: DC

## 2012-08-10 MED ORDER — PROPRANOLOL HCL 20 MG PO TABS: 20.0000 mg | ORAL_TABLET | Freq: Two times a day (BID) | ORAL | Status: DC

## 2012-08-11 ENCOUNTER — Encounter: Payer: Self-pay | Admitting: Family Medicine

## 2012-08-11 NOTE — Telephone Encounter (Signed)
Spoke with patient and she would like know since her legs feel better off of the lovastatin should she try something different or break them in half as discussed?  Please Advise.

## 2012-08-11 NOTE — Telephone Encounter (Signed)
Advised patient of Dr. Rennis Golden recommendations. Placed 1 month supply of samples at the front desk.

## 2012-08-11 NOTE — Telephone Encounter (Signed)
We can provide samples of Crestor 10mg  which has much fewer muscle side effects, 1 month of samples and pt should let us know how she's doing

## 2012-08-14 ENCOUNTER — Other Ambulatory Visit: Payer: Self-pay

## 2012-08-14 MED ORDER — OMEPRAZOLE 40 MG PO CPDR: 40.0000 mg | DELAYED_RELEASE_CAPSULE | Freq: Every day | ORAL | Status: DC

## 2012-08-23 ENCOUNTER — Telehealth: Payer: Self-pay | Admitting: Family Medicine

## 2012-08-23 NOTE — Telephone Encounter (Signed)
Patient Information:  Caller Name: Iver  Phone: [phone]  Patient: Alexis Banks, Alexis Banks  Gender: Female  DOB: [birth year]  Age: 67 Years  PCP: Sheliah Hatch.  Office Follow Up:  Does the office need to follow up with this patient?: No  Instructions For The Office: N/A   Symptoms  Reason For Call & Symptoms: Oliver states "she had a crick in her neck" on 08/22/12.  Took Advil. Had sore, stiff  neck ache at 22:00 and took 2 Advil. States Advil relieved pain x 3 to 4 hours. States when she layed down she was unable to lay on side at 0130.  Had "feeling of pulled muscle in shoulder  Blade". Had trouble sleeping due to discomfort. Has had constant dull ache in neck and shoulder blade since 0400. No known injury.  Per shoulder pain protocol has see within 3 days in office due to pain worsened or caused by bending the neck.Appt scheduled in [facility] for 08/25/12 at 1545 with Dr. Drue Novel. Care advice given.  Reviewed Health History In EMR: Yes  Reviewed Medications In EMR: Yes  Reviewed Allergies In EMR: Yes  Reviewed Surgeries / Procedures: Yes  Date of Onset of Symptoms: 08/22/2012  Treatments Tried: Advil, Blue EMU- cream on warm towel  Treatments Tried Worked: Yes  Guideline(s) Used:  Neck Pain or Stiffness  Shoulder Pain  Disposition Per Guideline:   See Within 3 Days in Office  Reason For Disposition Reached:   Pain is worsened or caused by bending the neck  Advice Given:  Call Back If  You become worse.  Heat to Area Forensic scientist option):  If stiffness lasts more than 48 hours, relax under a hot shower for 20 minutes twice a day. Gently exercise the involved part under water.  Expected Course   Muscle Strain: A minor muscle strain usually hurts for 2 - 3 days. The pain often peaks on day 2. A more severe muscle strain can hurt for 2-4 weeks.  Muscle Overuse: Sore muscles from overuse usually hurts for 2 - 4 days. The pain often peaks on day 2.  Call Back If  You become worse.  Patient  Will Follow Care Advice:  YES  Appointment Scheduled:  08/25/2012 15:45:00 Appointment Scheduled Provider:  Willow Ora

## 2012-08-24 NOTE — Telephone Encounter (Signed)
Pt has an appt with Dr. Drue Novel on Friday(08-25-12).//AB/CMA

## 2012-08-25 ENCOUNTER — Ambulatory Visit: Payer: Medicare Other | Admitting: Internal Medicine

## 2012-10-21 ENCOUNTER — Other Ambulatory Visit: Payer: Self-pay | Admitting: Family Medicine

## 2012-10-23 NOTE — Telephone Encounter (Signed)
Last seen 07/24/2012 Last visit 07/24/2012 UDS 07/24/2012-low risk, contract on file  Please advise

## 2012-10-24 ENCOUNTER — Telehealth: Payer: Self-pay | Admitting: Family Medicine

## 2012-10-24 NOTE — Telephone Encounter (Addendum)
Patient called and stated that she took some nyquil yesterday because she was coughing persistently and also her bladder medication called  oxybutynin (DITROPAN) 5 MG tablet. She stated that her husband said after she took both medications she started talking to the wall,eating everything and couldn't stop moving. Patient wanted to know could she stop taking the bladder medication (oxybutynin (DITROPAN) 5 MG tablet).

## 2012-10-24 NOTE — Telephone Encounter (Signed)
Pt.notified

## 2012-10-24 NOTE — Telephone Encounter (Signed)
Yes- it is ok to stop the Ditropan

## 2012-11-07 ENCOUNTER — Other Ambulatory Visit: Payer: Self-pay | Admitting: Family Medicine

## 2012-11-10 ENCOUNTER — Encounter: Payer: Self-pay | Admitting: Family Medicine

## 2012-11-10 ENCOUNTER — Ambulatory Visit (INDEPENDENT_AMBULATORY_CARE_PROVIDER_SITE_OTHER): Payer: Medicare Other | Admitting: Family Medicine

## 2012-11-10 VITALS — BP 130/78 | HR 62 | Temp 98.1°F | Wt 176.2 lb

## 2012-11-10 DIAGNOSIS — B354 Tinea corporis: Secondary | ICD-10-CM

## 2012-11-10 DIAGNOSIS — E669 Obesity, unspecified: Secondary | ICD-10-CM | POA: Insufficient documentation

## 2012-11-10 MED ORDER — NYSTATIN 100000 UNIT/GM EX POWD: CUTANEOUS | Status: DC

## 2012-11-10 NOTE — Patient Instructions (Signed)
Yeast Infection of the Skin Some yeast on the skin is normal, but sometimes it causes an infection. If you have a yeast infection, it shows up as white or light brown patches on brown skin. You can see it better in the summer on tan skin. It causes light-colored holes in your suntan. It can happen on any area of the body. This cannot be passed from person to person. HOME CARE  Scrub your skin daily with a dandruff shampoo. Your rash may take a couple weeks to get well.  Do not scratch or itch the rash. GET HELP RIGHT AWAY IF:   You get another infection from scratching. The skin may get warm, red, and may ooze fluid.  The infection does not seem to be getting better. MAKE SURE YOU:  Understand these instructions.  Will watch your condition.  Will get help right away if you are not doing well or get worse. Document Released: 12/18/2007 Document Revised: 03/29/2011 Document Reviewed: 12/18/2007 Exit[employer] Patient Information 2014 [employer].  

## 2012-11-10 NOTE — Progress Notes (Signed)
  Subjective:     Alexis Banks is a 67 y.o. female who presents for evaluation of a rash involving the area under r breast and suprapubic area. Rash started several days ago. Lesions are pink  under breast, and raised and papular in texture in suprapubic area. Rash has not changed over time. Rash is pruritic. Associated symptoms: abdominal pain. Patient denies: abdominal pain, arthralgia, congestion, cough, crankiness, decrease in appetite, decrease in energy level, fever, headache, irritability, myalgia, nausea, sore throat and vomiting. Patient has not had contacts with similar rash. Patient has not had new exposures (soaps, lotions, laundry detergents, foods, medications, plants, insects or animals).  The following portions of the patient's history were reviewed and updated as appropriate: allergies, current medications, past family history, past medical history, past social history, past surgical history and problem list.  Review of Systems Pertinent items are noted in HPI.    Objective:    BP 130/78  Pulse 62  Temp(Src) 98.1 F (36.7 C) (Oral)  Wt 176 lb 3.2 oz (79.924 kg)  BMI 32.76 kg/m2  SpO2 97% General:  alert, cooperative, appears stated age and no distress  Skin:  erythema noted on area under R breast , moles noted on suprapubic area and papules noted on area under R breast     Assessment:    tinea corporis    Mole-- suprapubic area  Plan:    Medications: nystatin powder. Written patient instruction given. Follow up in a few days.  When irritation resolved we may need to bx mole

## 2012-11-20 ENCOUNTER — Telehealth: Payer: Self-pay | Admitting: Family Medicine

## 2012-11-20 MED ORDER — LOVASTATIN 40 MG PO TABS: ORAL_TABLET | ORAL | Status: DC

## 2012-11-20 NOTE — Telephone Encounter (Signed)
Med filled.  

## 2012-11-20 NOTE — Telephone Encounter (Signed)
Patient is calling to request a refill on her rx lovastatin (MEVACOR) 40 MG tablet. She would like this sent to [facility] on [address].

## 2012-11-20 NOTE — Telephone Encounter (Signed)
Med filled and pt notified.  

## 2012-12-12 ENCOUNTER — Other Ambulatory Visit: Payer: Self-pay | Admitting: Family Medicine

## 2013-01-22 ENCOUNTER — Encounter: Payer: Self-pay | Admitting: Family Medicine

## 2013-01-22 ENCOUNTER — Ambulatory Visit (INDEPENDENT_AMBULATORY_CARE_PROVIDER_SITE_OTHER): Payer: Medicare HMO | Admitting: Family Medicine

## 2013-01-22 ENCOUNTER — Ambulatory Visit: Payer: Medicare Other | Admitting: Family Medicine

## 2013-01-22 VITALS — BP 132/80 | HR 62 | Temp 98.2°F | Wt 176.8 lb

## 2013-01-22 DIAGNOSIS — E039 Hypothyroidism, unspecified: Secondary | ICD-10-CM

## 2013-01-22 DIAGNOSIS — E785 Hyperlipidemia, unspecified: Secondary | ICD-10-CM

## 2013-01-22 DIAGNOSIS — I2510 Atherosclerotic heart disease of native coronary artery without angina pectoris: Secondary | ICD-10-CM

## 2013-01-22 DIAGNOSIS — I10 Essential (primary) hypertension: Secondary | ICD-10-CM

## 2013-01-22 LAB — LIPID PANEL
CHOLESTEROL: 166 mg/dL (ref 0–200)
HDL: 52.5 mg/dL (ref >39.00)
LDL CALC: 82 mg/dL (ref 0–99)
Total CHOL/HDL Ratio: 3
Triglycerides: 159 mg/dL — ABNORMAL HIGH (ref 0.0–149.0)
VLDL: 31.8 mg/dL (ref 0.0–40.0)

## 2013-01-22 LAB — HEPATIC FUNCTION PANEL
ALT: 17 U/L (ref 0–35)
AST: 22 U/L (ref 0–37)
Albumin: 4.3 g/dL (ref 3.5–5.2)
Alkaline Phosphatase: 78 U/L (ref 39–117)
BILIRUBIN DIRECT: 0 mg/dL (ref 0.0–0.3)
TOTAL PROTEIN: 7.7 g/dL (ref 6.0–8.3)
Total Bilirubin: 0.6 mg/dL (ref 0.3–1.2)

## 2013-01-22 LAB — BASIC METABOLIC PANEL
BUN: 20 mg/dL (ref 6–23)
CALCIUM: 9.4 mg/dL (ref 8.4–10.5)
CO2: 28 mEq/L (ref 19–32)
Chloride: 104 mEq/L (ref 96–112)
Creatinine, Ser: 1 mg/dL (ref 0.4–1.2)
GFR: 59.82 mL/min — AB (ref >60.00)
GLUCOSE: 100 mg/dL — AB (ref 70–99)
POTASSIUM: 4.4 meq/L (ref 3.5–5.1)
Sodium: 139 mEq/L (ref 135–145)

## 2013-01-22 LAB — TSH: TSH: 15.39 u[IU]/mL — ABNORMAL HIGH (ref 0.35–5.50)

## 2013-01-22 MED ORDER — ZOLPIDEM TARTRATE 10 MG PO TABS: 10.0000 mg | ORAL_TABLET | Freq: Every evening | ORAL | Status: DC | PRN

## 2013-01-22 MED ORDER — OSELTAMIVIR PHOSPHATE 75 MG PO CAPS: 75.0000 mg | ORAL_CAPSULE | Freq: Two times a day (BID) | ORAL | Status: DC

## 2013-01-22 NOTE — Assessment & Plan Note (Signed)
Chronic problem.  Adequate control.  Asymptomatic.  Check labs.  No anticipated med changes 

## 2013-01-22 NOTE — Assessment & Plan Note (Signed)
Chronic problem.  Goal is medical management w/ ASA, BP and lipid control.  Will continue to follow.

## 2013-01-22 NOTE — Assessment & Plan Note (Signed)
Chronic problem.  Tolerating statin w/out difficulty.  Check labs.  Adjust meds prn  

## 2013-01-22 NOTE — Progress Notes (Signed)
   Subjective:    Patient ID: Alexis Banks, female    DOB: 1945-12-15, 68 y.o.   MRN: [id number]  HPI Pre visit review using our clinic review tool, if applicable. No additional management support is needed unless otherwise documented below in the visit note.  HTN- chronic problem, on Benazepril, propranolol.  No CP, SOB, HAs, visual changes, edema.  Hyperlipidemia- chronic problem.  On Lovastatin.  No abd pain, N/V, myalgias  Hypothyroid- chronic problem, on Synthroid.  Denies excessive fatigue, no changes to skin/hair/nail  CAD- following w/ cards.   Goal is risk reduction in BP and cholesterol control.  On ASA, ACE, beta blocker, statin.   Review of Systems For ROS see HPI     Objective:   Physical Exam  Vitals reviewed. Constitutional: She is oriented to person, place, and time. She appears well-developed and well-nourished. No distress.  HENT:  Head: Normocephalic and atraumatic.  Eyes: Conjunctivae and EOM are normal. Pupils are equal, round, and reactive to light.  Neck: Normal range of motion. Neck supple. No thyromegaly present.  Cardiovascular: Normal rate, regular rhythm, normal heart sounds and intact distal pulses.   No murmur heard. Pulmonary/Chest: Effort normal and breath sounds normal. No respiratory distress.  Abdominal: Soft. She exhibits no distension. There is no tenderness.  Musculoskeletal: She exhibits no edema.  Lymphadenopathy:    She has no cervical adenopathy.  Neurological: She is alert and oriented to person, place, and time.  Skin: Skin is warm and dry.  Psychiatric: She has a normal mood and affect. Her behavior is normal.          Assessment & Plan:

## 2013-01-22 NOTE — Patient Instructions (Signed)
Schedule your complete physical in 6 months We'll notify you of your lab results and make any changes if needed Keep up the good work!  You look great! Call with any questions or concerns Happy Early Birthday and Happy New Year!!!

## 2013-01-22 NOTE — Assessment & Plan Note (Signed)
Chronic problem.  Currently asymptomatic.  Check labs.  Adjust meds prn  

## 2013-01-25 ENCOUNTER — Other Ambulatory Visit: Payer: Self-pay | Admitting: Family Medicine

## 2013-01-25 DIAGNOSIS — E039 Hypothyroidism, unspecified: Secondary | ICD-10-CM

## 2013-02-14 ENCOUNTER — Other Ambulatory Visit: Payer: Self-pay | Admitting: Family Medicine

## 2013-02-15 NOTE — Telephone Encounter (Signed)
Omeprazole refilled per protocol. JG//CMA 

## 2013-02-28 ENCOUNTER — Other Ambulatory Visit (INDEPENDENT_AMBULATORY_CARE_PROVIDER_SITE_OTHER): Payer: Medicare HMO

## 2013-02-28 DIAGNOSIS — E039 Hypothyroidism, unspecified: Secondary | ICD-10-CM

## 2013-02-28 LAB — TSH: TSH: 4.12 u[IU]/mL (ref 0.35–5.50)

## 2013-03-01 ENCOUNTER — Encounter: Payer: Self-pay | Admitting: General Practice

## 2013-03-14 ENCOUNTER — Telehealth: Payer: Self-pay | Admitting: *Deleted

## 2013-03-14 NOTE — Telephone Encounter (Signed)
Prior authorization paperwork for Zolpidem faxed. Awaiting decision. JG//CMA

## 2013-03-19 ENCOUNTER — Other Ambulatory Visit: Payer: Self-pay | Admitting: Family Medicine

## 2013-03-19 NOTE — Telephone Encounter (Signed)
Med filled.  

## 2013-03-20 NOTE — Telephone Encounter (Signed)
Drug coverage is approved from 01/18/2013-01/17/2014. JG//CMA

## 2013-05-10 ENCOUNTER — Other Ambulatory Visit: Payer: Self-pay | Admitting: Family Medicine

## 2013-05-10 NOTE — Telephone Encounter (Signed)
Rx sent to the pharmacy by e-script.//AB/CMA 

## 2013-07-02 ENCOUNTER — Ambulatory Visit (INDEPENDENT_AMBULATORY_CARE_PROVIDER_SITE_OTHER): Payer: Medicare HMO | Admitting: Family Medicine

## 2013-07-02 ENCOUNTER — Encounter: Payer: Self-pay | Admitting: Family Medicine

## 2013-07-02 VITALS — BP 110/56 | HR 35 | Temp 98.0°F | Resp 16 | Wt 178.1 lb

## 2013-07-02 DIAGNOSIS — R5381 Other malaise: Secondary | ICD-10-CM | POA: Insufficient documentation

## 2013-07-02 DIAGNOSIS — R001 Bradycardia, unspecified: Secondary | ICD-10-CM

## 2013-07-02 DIAGNOSIS — I498 Other specified cardiac arrhythmias: Secondary | ICD-10-CM

## 2013-07-02 DIAGNOSIS — R5383 Other fatigue: Secondary | ICD-10-CM

## 2013-07-02 LAB — CBC WITH DIFFERENTIAL/PLATELET
Basophils Absolute: 0 10*3/uL (ref 0.0–0.1)
Basophils Relative: 0.5 % (ref 0.0–3.0)
EOS PCT: 3.2 % (ref 0.0–5.0)
Eosinophils Absolute: 0.2 10*3/uL (ref 0.0–0.7)
HCT: 31.6 % — ABNORMAL LOW (ref 36.0–46.0)
HEMOGLOBIN: 9.9 g/dL — AB (ref 12.0–15.0)
Lymphocytes Relative: 34.5 % (ref 12.0–46.0)
Lymphs Abs: 2.1 10*3/uL (ref 0.7–4.0)
MCHC: 31.3 g/dL (ref 30.0–36.0)
MCV: 82.2 fl (ref 78.0–100.0)
MONOS PCT: 7.6 % (ref 3.0–12.0)
Monocytes Absolute: 0.5 10*3/uL (ref 0.1–1.0)
NEUTROS ABS: 3.3 10*3/uL (ref 1.4–7.7)
Neutrophils Relative %: 54.2 % (ref 43.0–77.0)
Platelets: 311 10*3/uL (ref 150.0–400.0)
RBC: 3.84 Mil/uL — AB (ref 3.87–5.11)
RDW: 16.4 % — ABNORMAL HIGH (ref 11.5–15.5)
WBC: 6 10*3/uL (ref 4.0–10.5)

## 2013-07-02 LAB — BASIC METABOLIC PANEL
BUN: 17 mg/dL (ref 6–23)
CO2: 27 mEq/L (ref 19–32)
Calcium: 9.4 mg/dL (ref 8.4–10.5)
Chloride: 105 mEq/L (ref 96–112)
Creatinine, Ser: 1 mg/dL (ref 0.4–1.2)
GFR: 60.45 mL/min (ref >60.00)
GLUCOSE: 104 mg/dL — AB (ref 70–99)
Potassium: 4.3 mEq/L (ref 3.5–5.1)
SODIUM: 138 meq/L (ref 135–145)

## 2013-07-02 LAB — TSH: TSH: 0.52 u[IU]/mL (ref 0.35–4.50)

## 2013-07-02 MED ORDER — ZOLPIDEM TARTRATE 10 MG PO TABS: 10.0000 mg | ORAL_TABLET | Freq: Every evening | ORAL | Status: DC | PRN

## 2013-07-02 NOTE — Progress Notes (Signed)
   Subjective:    Patient ID: Alexis Banks, female    DOB: 1945/09/14, 68 y.o.   MRN: [id number]  HPI Fatigue- sxs started 6/1, with difficulty rising from seated position.  Felt 'weak', unsteady.  Also noted when getting out of the car to go shopping- required cart to steady herself.  Developed cold sweat while in the store- 'just for a couple of minutes'.  Now having difficulty going up steps- finds herself needing to hold on and 'go slowly'.  No CP, SOB- but reports a 'tight feeling' in the center of her chest.  No nausea.  No med changes.   Review of Systems For ROS see HPI     Objective:   Physical Exam  Vitals reviewed. Constitutional: She is oriented to person, place, and time. She appears well-developed and well-nourished. No distress.  HENT:  Head: Normocephalic and atraumatic.  Neck: Normal range of motion. Neck supple. No thyromegaly present.  Cardiovascular: Normal rate, regular rhythm, normal heart sounds and intact distal pulses.   Pulmonary/Chest: Effort normal and breath sounds normal. No respiratory distress. She has no wheezes. She has no rales.  Abdominal: Soft. Bowel sounds are normal. She exhibits no distension. There is no tenderness. There is no rebound.  Musculoskeletal: Normal range of motion. She exhibits no edema.  Lymphadenopathy:    She has no cervical adenopathy.  Neurological: She is alert and oriented to person, place, and time.  Skin: Skin is warm and dry.          Assessment & Plan:

## 2013-07-02 NOTE — Patient Instructions (Signed)
Follow up in 1 month to recheck symptoms of weakness/fatigue We'll notify you of your lab results and make any changes if needed Decrease the Propranolol to 1/2 tab twice daily Drink plenty of fluids REST! Change positions slowly to allow yourself time to adjust Call with any questions or concerns- particularly if symptoms change or worsen Hang in there!!!

## 2013-07-02 NOTE — Progress Notes (Signed)
Pre visit review using our clinic review tool, if applicable. No additional management support is needed unless otherwise documented below in the visit note. 

## 2013-07-03 ENCOUNTER — Other Ambulatory Visit: Payer: Self-pay | Admitting: Family Medicine

## 2013-07-03 ENCOUNTER — Encounter: Payer: Self-pay | Admitting: *Deleted

## 2013-07-03 ENCOUNTER — Encounter: Payer: Self-pay | Admitting: Internal Medicine

## 2013-07-03 DIAGNOSIS — D649 Anemia, unspecified: Secondary | ICD-10-CM

## 2013-07-09 NOTE — Assessment & Plan Note (Signed)
New.  May be contributing to pt's current fatigue.  Will decrease propranolol to 1/2 tab.  Will follow closely for symptom improvement and normalization of HR.  Pt expressed understanding and is in agreement w/ plan.

## 2013-07-09 NOTE — Assessment & Plan Note (Signed)
New.  May be symptomatic bradycardia- see above.  EKG unchanged from previous.  Check labs to r/o metabolic cause- thyroid, electrolytes, anemia.  Treat any underlying issues.  Will follow closely.

## 2013-07-11 ENCOUNTER — Encounter: Payer: Self-pay | Admitting: Internal Medicine

## 2013-07-11 ENCOUNTER — Other Ambulatory Visit (INDEPENDENT_AMBULATORY_CARE_PROVIDER_SITE_OTHER): Payer: Medicare HMO

## 2013-07-11 ENCOUNTER — Ambulatory Visit (INDEPENDENT_AMBULATORY_CARE_PROVIDER_SITE_OTHER): Payer: Medicare HMO | Admitting: Internal Medicine

## 2013-07-11 VITALS — BP 118/82 | HR 74 | Ht 61.5 in | Wt 175.8 lb

## 2013-07-11 DIAGNOSIS — D689 Coagulation defect, unspecified: Secondary | ICD-10-CM

## 2013-07-11 DIAGNOSIS — D509 Iron deficiency anemia, unspecified: Secondary | ICD-10-CM

## 2013-07-11 LAB — IBC PANEL
IRON: 26 ug/dL — AB (ref 42–145)
Saturation Ratios: 4.6 % — ABNORMAL LOW (ref 20.0–50.0)
Transferrin: 400.7 mg/dL — ABNORMAL HIGH (ref 212.0–360.0)

## 2013-07-11 LAB — VITAMIN B12: VITAMIN B 12: 308 pg/mL (ref 211–911)

## 2013-07-11 MED ORDER — DICYCLOMINE HCL 10 MG PO CAPS: 10.0000 mg | ORAL_CAPSULE | Freq: Four times a day (QID) | ORAL | Status: DC | PRN

## 2013-07-11 MED ORDER — BISACODYL 5 MG PO TBEC
DELAYED_RELEASE_TABLET | ORAL | Status: DC
Start: 2013-07-11 — End: 2013-09-19

## 2013-07-11 MED ORDER — MOVIPREP 100 G PO SOLR: 1.0000 | Freq: Once | ORAL | Status: DC

## 2013-07-11 MED ORDER — METOCLOPRAMIDE HCL 10 MG PO TABS: ORAL_TABLET | ORAL | Status: DC

## 2013-07-11 MED ORDER — POLYETHYLENE GLYCOL 3350 17 GM/SCOOP PO POWD: ORAL | Status: DC

## 2013-07-11 NOTE — Patient Instructions (Addendum)
You have been scheduled for an endoscopy and colonoscopy. Please follow the written instructions given to you at your visit today. Please pick up your prep at the pharmacy within the next 1-3 days. If you use inhalers (even only as needed), please bring them with you on the day of your procedure. Your physician has requested that you go to www.startemmi.com and enter the access code given to you at your visit today. This web site gives a general overview about your procedure. However, you should still follow specific instructions given to you by our office regarding your preparation for the procedure.  Your physician has requested that you go to the basement for the following lab work before leaving today: IBC, B12, Celiac 10  Please purchase the following medications over the counter and take as directed: Ferrous Sulfate 325 mg-Take 1 tablet three times daily  We have sent the following medications to your pharmacy for you to pick up at your convenience: Bentyl 10 mg-Take 1 tablet before going to [facility]  CC:Dr K.Birdie Riddle

## 2013-07-11 NOTE — Progress Notes (Signed)
Alexis Banks 1945-12-17 [id number]  Note: This dictation was prepared with Dragon digital system. Any transcriptional errors that result from this procedure are unintentional.   History of Present Illness:  This is a 68 year old white female who retired from [employer] several years ago. She is here to evaluate iron deficiency anemia. Dr Birdie Riddle saw the patient last week for symptoms of weakness and fatigue. Her hemoglobin was 9.9 with an MCV of 82. She takes aspirin and Prilosec. In July 2014, her hemoglobin was 12.4 and in July 2012, her hemoglobin was 13.6. She has a history of iron deficiency anemia approximately 15 years ago when her hemoglobin dropped to 7.9. She was premenopausal at that point. The anemia corrected with oral supplements. 5 years ago, she saw Dr. Watt Climes and underwent an endoscopy and colonoscopy with no significant diagnosis. We have requested the reports. She has been having yearly Hemoccult cards by her gynecologis  Dr Armando Gang which have always been negative. There is no family history of colon cancer. She has regular bowel habits. She has occasional reflux but no dysphagia. Her weight has been stable.    Past Medical History  Diagnosis Date  . Hyperlipidemia   . Hypertension   . CORONARY ARTERY DISEASE   . VARICOSE VEINS, LOWER EXTREMITIES   . HYPOTHYROIDISM   . VITAMIN D DEFICIENCY   . ANXIETY DEPRESSION   . GERD   . ROTATOR CUFF SYNDROME, RIGHT   . INSOMNIA   . Trochanteric bursitis of left hip   . Common bile duct dilation   . Anemia     Past Surgical History  Procedure Laterality Date  . Cholecystectomy    . Rotator cuff surgery      Allergies  Allergen Reactions  . Adhesive [Tape] Rash    Rubbery tape - blisters    Family history and social history have been reviewed.  Review of Systems: Denies dysphagia heartburn abdominal pain  The remainder of the 10 point ROS is negative except as outlined in the H&P  Physical  Exam: General Appearance Well developed, in no distress Eyes  Non icteric  HEENT  Non traumatic, normocephalic  Mouth No lesion, tongue papillated, no cheilosis Neck Supple without adenopathy, thyroid not enlarged, no carotid bruits, no JVD Lungs Clear to auscultation bilaterally COR Normal S1, normal S2, regular rhythm, no murmur, quiet precordium Abdomen soft nontender nondistended. Normoactive bowel sounds. Liver edge at costal margin Rectal soft brown Hemoccult negative stool Extremities  No pedal edema Skin No lesions Neurological Alert and oriented x 3 Psychological Normal mood and affect  Assessment and Plan:   Problem #63 68 year old white female with microcytic anemia and Hemoccult negative stool. She also had this problem about 15 years ago and about 5 years ago. He will request results of the endoscopy and colonoscopy from Dr Watt Climes. She may need a small bowel capsule endoscopy to look for AVMs. In the meantime, patient will start iron supplements. We will also check her iron levels and B12 levels and as well as a sprue profile to rule out malabsorption. We will proceed with an upper endoscopy and colonoscopy as well as small bowel biopsies.    Delfin Edis 07/11/2013

## 2013-07-12 LAB — CELIAC PANEL 10
Endomysial Screen: NEGATIVE
Gliadin IgA: 23.3 U/mL — ABNORMAL HIGH (ref <20)
Gliadin IgG: 17.6 U/mL (ref <20)
IgA: 337 mg/dL (ref 69–380)
Tissue Transglut Ab: 17.9 U/mL (ref <20)
Tissue Transglutaminase Ab, IgA: 9 U/mL (ref <20)

## 2013-07-17 ENCOUNTER — Telehealth: Payer: Self-pay | Admitting: *Deleted

## 2013-07-17 ENCOUNTER — Encounter: Payer: Self-pay | Admitting: Internal Medicine

## 2013-07-17 MED ORDER — LEVOTHYROXINE SODIUM 125 MCG PO TABS: ORAL_TABLET | ORAL | Status: DC

## 2013-07-17 NOTE — Telephone Encounter (Signed)
Caller name:  [place] Relation to pt:  self Call back number: [phone]  Pharmacy:  [facility] - [phone] [address] (phone [phone])  Reason for call:   Pt called requesting refill on   levothyroxine (SYNTHROID, LEVOTHROID) 125 MCG tablet  Last filled 03/19/13, #90, 1 refill Last OV 07/02/2013  Pt request the prescription be done for 1 month at a time.  She also wants you to know she is having a colonoscopy on 07/30/13.

## 2013-07-17 NOTE — Telephone Encounter (Signed)
Med filled and pt notified.  

## 2013-07-18 ENCOUNTER — Encounter: Payer: Self-pay | Admitting: Family Medicine

## 2013-07-19 ENCOUNTER — Telehealth: Payer: Self-pay | Admitting: Internal Medicine

## 2013-07-19 NOTE — Telephone Encounter (Signed)
32 Pages of records were Rec Via Fax From [facility] sent Interoffice to Dr.Dora Olevia Perches [phone]/km

## 2013-07-27 ENCOUNTER — Telehealth: Payer: Self-pay | Admitting: Internal Medicine

## 2013-07-27 NOTE — Telephone Encounter (Signed)
Patient asking to reschedule prop Endo Colon because she had to go out of town. Cancelled procedure. Rescheduled on 09/19/13 at [facility] on 1:30 PM. Patient will come on 08/23/13 at 11:30 AM to sign her consent forms.

## 2013-07-30 ENCOUNTER — Encounter: Payer: Self-pay | Admitting: Family Medicine

## 2013-07-30 ENCOUNTER — Ambulatory Visit (INDEPENDENT_AMBULATORY_CARE_PROVIDER_SITE_OTHER): Payer: Medicare HMO | Admitting: Family Medicine

## 2013-07-30 ENCOUNTER — Encounter (HOSPITAL_COMMUNITY): Admission: RE | Payer: Self-pay | Source: Ambulatory Visit

## 2013-07-30 ENCOUNTER — Ambulatory Visit (HOSPITAL_COMMUNITY): Admission: RE | Admit: 2013-07-30 | Payer: Medicare HMO | Source: Ambulatory Visit | Admitting: Internal Medicine

## 2013-07-30 VITALS — BP 112/62 | HR 67 | Temp 98.0°F | Resp 16 | Ht 61.5 in | Wt 177.5 lb

## 2013-07-30 DIAGNOSIS — E2839 Other primary ovarian failure: Secondary | ICD-10-CM

## 2013-07-30 DIAGNOSIS — E039 Hypothyroidism, unspecified: Secondary | ICD-10-CM

## 2013-07-30 DIAGNOSIS — E669 Obesity, unspecified: Secondary | ICD-10-CM

## 2013-07-30 DIAGNOSIS — I2510 Atherosclerotic heart disease of native coronary artery without angina pectoris: Secondary | ICD-10-CM

## 2013-07-30 DIAGNOSIS — E559 Vitamin D deficiency, unspecified: Secondary | ICD-10-CM

## 2013-07-30 DIAGNOSIS — F341 Dysthymic disorder: Secondary | ICD-10-CM

## 2013-07-30 DIAGNOSIS — I10 Essential (primary) hypertension: Secondary | ICD-10-CM

## 2013-07-30 DIAGNOSIS — Z0000 Encounter for general adult medical examination without abnormal findings: Secondary | ICD-10-CM

## 2013-07-30 DIAGNOSIS — E785 Hyperlipidemia, unspecified: Secondary | ICD-10-CM

## 2013-07-30 LAB — LIPID PANEL
CHOL/HDL RATIO: 3
CHOLESTEROL: 126 mg/dL (ref 0–200)
HDL: 45.9 mg/dL (ref >39.00)
LDL CALC: 54 mg/dL (ref 0–99)
NonHDL: 80.1
Triglycerides: 130 mg/dL (ref 0.0–149.0)
VLDL: 26 mg/dL (ref 0.0–40.0)

## 2013-07-30 LAB — HEPATIC FUNCTION PANEL
ALT: 12 U/L (ref 0–35)
AST: 20 U/L (ref 0–37)
Albumin: 3.8 g/dL (ref 3.5–5.2)
Alkaline Phosphatase: 79 U/L (ref 39–117)
BILIRUBIN DIRECT: 0.1 mg/dL (ref 0.0–0.3)
TOTAL PROTEIN: 7.1 g/dL (ref 6.0–8.3)
Total Bilirubin: 0.7 mg/dL (ref 0.2–1.2)

## 2013-07-30 SURGERY — EGD (ESOPHAGOGASTRODUODENOSCOPY)
Anesthesia: Moderate Sedation

## 2013-07-30 NOTE — Progress Notes (Signed)
Pre visit review using our clinic review tool, if applicable. No additional management support is needed unless otherwise documented below in the visit note. 

## 2013-07-30 NOTE — Patient Instructions (Signed)
Follow up in 6 months to recheck BP and  Cholesterol We'll notify you of your lab results and make any changes if needed Keep up the good work!  You look great! Schedule your mammo and bone density for the same time in December Call with any questions or concerns Enjoy the rest of your summer!!!

## 2013-07-30 NOTE — Progress Notes (Signed)
   Subjective:    Patient ID: Alexis Banks, female    DOB: 01-Jun-1945, 68 y.o.   MRN: [id number]  HPI Here today for CPE.  GI- [facility], Cards- [facility]  Risk Factors: HTN- chronic problem, excellent control on Benazepril, Propranolol 1/2 tab BID.  No CP, SOB, HAs, visual changes, edema. Hyperlipidemia- chronic problem, on Lovastatin.  Denies abd pain, N/V, myalgias Hypothyroid- chronic problem, on Levothyroxine.  Denies changes to skin/hair/nails CAD- chronic problem, currently asymptomatic.  Following w/ Dr Stanford Breed  Physical Activity: walking regularly Fall Risk: low risk Depression: chronic problem, stable.  Taking Ambien prn sleep and alprazolam rarely as needed Hearing: mildly decreased to conversational tones and whispered voice ADL's: independent Cognitive: normal linear thought process, memory and attention intact Home Safety: Height, Weight, BMI, Visual Acuity: see vitals, vision corrected to 20/20 w/ glasses Counseling: UTD on colonoscopy (scheduled), due for mammo and DEXA at [facility], no longer having paps. Labs Ordered: See A&P Care Plan: See A&P    Review of Systems Patient reports no vision/ hearing changes, adenopathy,fever, weight change,  persistant/recurrent hoarseness , swallowing issues, chest pain, palpitations, edema, persistant/recurrent cough, hemoptysis, dyspnea (rest/exertional/paroxysmal nocturnal), gastrointestinal bleeding (melena, rectal bleeding), abdominal pain, significant heartburn, bowel changes, GU symptoms (dysuria, hematuria, incontinence), Gyn symptoms (abnormal  bleeding, pain),  syncope, focal weakness, memory loss, numbness & tingling, skin/hair/nail changes, abnormal bruising or bleeding, anxiety, or depression.     Objective:   Physical Exam General Appearance:    Alert, cooperative, no distress, appears stated age  Head:    Normocephalic, without obvious abnormality, atraumatic  Eyes:    PERRL, conjunctiva/corneas clear, EOM's intact,  fundi    benign, both eyes  Ears:    Normal TM's and external ear canals, both ears  Nose:   Nares normal, septum midline, mucosa normal, no drainage    or sinus tenderness  Throat:   Lips, mucosa, and tongue normal; teeth and gums normal  Neck:   Supple, symmetrical, trachea midline, no adenopathy;    Thyroid: no enlargement/tenderness/nodules  Back:     Symmetric, no curvature, ROM normal, no CVA tenderness  Lungs:     Clear to auscultation bilaterally, respirations unlabored  Chest Wall:    No tenderness or deformity   Heart:    Regular rate and rhythm, S1 and S2 normal, no murmur, rub   or gallop  Breast Exam:    Deferred to mammo  Abdomen:     Soft, non-tender, bowel sounds active all four quadrants,    no masses, no organomegaly  Genitalia:    Deferred at pt's request  Rectal:    Extremities:   Extremities normal, atraumatic, no cyanosis or edema  Pulses:   2+ and symmetric all extremities  Skin:   Skin color, texture, turgor normal, no rashes or lesions  Lymph nodes:   Cervical, supraclavicular, and axillary nodes normal  Neurologic:   CNII-XII intact, normal strength, sensation and reflexes    throughout          Assessment & Plan:

## 2013-07-31 ENCOUNTER — Telehealth: Payer: Self-pay | Admitting: Family Medicine

## 2013-07-31 ENCOUNTER — Encounter: Payer: Self-pay | Admitting: General Practice

## 2013-07-31 LAB — VITAMIN D 25 HYDROXY (VIT D DEFICIENCY, FRACTURES): VITD: 24.32 ng/mL

## 2013-07-31 NOTE — Telephone Encounter (Signed)
Relevant patient education mailed to patient.  

## 2013-08-02 ENCOUNTER — Encounter: Payer: Self-pay | Admitting: Gastroenterology

## 2013-08-02 ENCOUNTER — Encounter: Payer: Self-pay | Admitting: *Deleted

## 2013-08-11 ENCOUNTER — Other Ambulatory Visit: Payer: Self-pay | Admitting: Family Medicine

## 2013-08-14 ENCOUNTER — Other Ambulatory Visit: Payer: Self-pay | Admitting: General Practice

## 2013-08-14 MED ORDER — PROPRANOLOL HCL 20 MG PO TABS: ORAL_TABLET | ORAL | Status: DC

## 2013-08-23 ENCOUNTER — Other Ambulatory Visit: Payer: Self-pay | Admitting: Internal Medicine

## 2013-08-26 NOTE — Assessment & Plan Note (Signed)
Check labs.  Replete prn. 

## 2013-08-26 NOTE — Assessment & Plan Note (Signed)
Chronic problem.  Goal is risk reduction in form of BP and lipid control.  Will continue to follow.

## 2013-08-26 NOTE — Assessment & Plan Note (Signed)
Chronic problem.  Adequate control.  No med changes.

## 2013-08-26 NOTE — Assessment & Plan Note (Signed)
Chronic problem.  Tolerating statin w/o difficulty.  Check labs.  Adjust meds prn  

## 2013-08-26 NOTE — Assessment & Plan Note (Signed)
Pt's PE WNL.  UTD on colonoscopy.  Pt due for mammo and DEXA in Dec.  Written screening schedule updated and given to pt.  Anticipatory guidance provided.

## 2013-08-26 NOTE — Assessment & Plan Note (Signed)
Chronic problem.  Currently asymptomatic.  Check labs.  Adjust meds prn  

## 2013-08-26 NOTE — Assessment & Plan Note (Signed)
Chronic problem.  Adequate control.  Asymptomatic.  Check labs.  No anticipated med changes 

## 2013-09-04 ENCOUNTER — Telehealth: Payer: Self-pay | Admitting: Family Medicine

## 2013-09-04 NOTE — Telephone Encounter (Signed)
Please check- I think propranolol is on the $4 list.  This is a generic medication and should be cheap

## 2013-09-04 NOTE — Telephone Encounter (Signed)
Caller name: Gaila Relation to pt: Call back [facility] [address]  Reason for call: pt is wanting to know is there anything else she can use besides the Propranolol HYDROCHLORIDE 20 mg.  The price has gone up and she wants to know similar options.

## 2013-09-04 NOTE — Telephone Encounter (Signed)
Unsure of how to proceed. Called pharmacy and they advised that pt is paying $17 for medication, this is up $4 from April. Spoke with Winferd Humphrey with [employer] and he advised that all other medications that are comprable to this medication are at a minimum $18-$20. Called pt and lmovm to inform.

## 2013-09-04 NOTE — Telephone Encounter (Signed)
It appears that [facility] has increased their prices on all comparable meds.  If not available on $4 list, there are not other options

## 2013-09-19 ENCOUNTER — Other Ambulatory Visit: Payer: Self-pay | Admitting: Internal Medicine

## 2013-09-19 ENCOUNTER — Ambulatory Visit (AMBULATORY_SURGERY_CENTER): Payer: Medicare HMO | Admitting: Internal Medicine

## 2013-09-19 ENCOUNTER — Encounter: Payer: Self-pay | Admitting: Internal Medicine

## 2013-09-19 VITALS — BP 157/92 | HR 68 | Temp 98.1°F | Resp 20 | Wt 177.0 lb

## 2013-09-19 DIAGNOSIS — K2100 Gastro-esophageal reflux disease with esophagitis, without bleeding: Secondary | ICD-10-CM

## 2013-09-19 DIAGNOSIS — K219 Gastro-esophageal reflux disease without esophagitis: Secondary | ICD-10-CM

## 2013-09-19 DIAGNOSIS — D649 Anemia, unspecified: Secondary | ICD-10-CM

## 2013-09-19 DIAGNOSIS — R1310 Dysphagia, unspecified: Secondary | ICD-10-CM

## 2013-09-19 DIAGNOSIS — D509 Iron deficiency anemia, unspecified: Secondary | ICD-10-CM

## 2013-09-19 DIAGNOSIS — K222 Esophageal obstruction: Secondary | ICD-10-CM

## 2013-09-19 DIAGNOSIS — K2980 Duodenitis without bleeding: Secondary | ICD-10-CM

## 2013-09-19 DIAGNOSIS — D689 Coagulation defect, unspecified: Secondary | ICD-10-CM

## 2013-09-19 MED ORDER — OMEPRAZOLE 40 MG PO CPDR: 40.0000 mg | DELAYED_RELEASE_CAPSULE | Freq: Every day | ORAL | Status: DC

## 2013-09-19 MED ORDER — SODIUM CHLORIDE 0.9 % IV SOLN: 500.0000 mL | INTRAVENOUS | Status: DC

## 2013-09-19 NOTE — Patient Instructions (Addendum)
YOU HAD AN ENDOSCOPIC PROCEDURE TODAY AT [facility]: Refer to the procedure report that was given to you for any specific questions about what was found during the examination.  If the procedure report does not answer your questions, please call your gastroenterologist to clarify.  If you requested that your care partner not be given the details of your procedure findings, then the procedure report has been included in a sealed envelope for you to review at your convenience later.  YOU SHOULD EXPECT: Some feelings of bloating in the abdomen. Passage of more gas than usual.  Walking can help get rid of the air that was put into your GI tract during the procedure and reduce the bloating. If you had a lower endoscopy (such as a colonoscopy or flexible sigmoidoscopy) you may notice spotting of blood in your stool or on the toilet paper. If you underwent a bowel prep for your procedure, then you may not have a normal bowel movement for a few days.  DIET: See dilation diet handout.  Drink plenty of fluids but you should avoid alcoholic beverages for 24 hours.  ACTIVITY: Your care partner should take you home directly after the procedure.  You should plan to take it easy, moving slowly for the rest of the day.  You can resume normal activity the day after the procedure however you should NOT DRIVE or use heavy machinery for 24 hours (because of the sedation medicines used during the test).    SYMPTOMS TO REPORT IMMEDIATELY: A gastroenterologist can be reached at any hour.  During normal business hours, 8:30 AM to 5:00 PM Monday through Friday, call [phone].  After hours and on weekends, please call the GI answering service at [phone] who will take a message and have the physician on call contact you.   Following lower endoscopy (colonoscopy or flexible sigmoidoscopy):  Excessive amounts of blood in the stool  Significant tenderness or worsening of abdominal pains  Swelling of  the abdomen that is new, acute  Fever of 100F or higher  Following upper endoscopy (EGD)  Vomiting of blood or coffee ground material  New chest pain or pain under the shoulder blades  Painful or persistently difficult swallowing  New shortness of breath  Fever of 100F or higher  Black, tarry-looking stools  FOLLOW UP: If any biopsies were taken you will be contacted by phone or by letter within the next 1-3 weeks.  Call your gastroenterologist if you have not heard about the biopsies in 3 weeks.  Our staff will call the home number listed on your records the next business day following your procedure to check on you and address any questions or concerns that you may have at that time regarding the information given to you following your procedure. This is a courtesy call and so if there is no answer at the home number and we have not heard from you through the emergency physician on call, we will assume that you have returned to your regular daily activities without incident.  SIGNATURES/CONFIDENTIALITY: You and/or your care partner have signed paperwork which will be entered into your electronic medical record.  These signatures attest to the fact that that the information above on your After Visit Summary has been reviewed and is understood.  Full responsibility of the confidentiality of this discharge information lies with you and/or your care-partner.  Diverticulosis, high fiber diet, dilation diet, gastritis, hiatal hernia-handouts given  Repeat colonoscopy in 10 years-2025.  Continue  omperazole.  Iron supplements.  Monitor hemoglobin and hematocrit.  Anti-reflux regimen.

## 2013-09-19 NOTE — Op Note (Signed)
[place]  [address] [place], [address]   ENDOSCOPY PROCEDURE REPORT  PATIENT: Alexis Banks, Alexis Banks  MR#: [id number] BIRTHDATE: 28-Sep-1945 , 68  yrs. old GENDER: Female ENDOSCOPIST: Lafayette Dragon, MD REFERRED BY:  Annye Asa, M.D. PROCEDURE DATE:  09/19/2013 PROCEDURE:  EGD w/ biopsy and Savary dilation of esophagus ASA CLASS:     Class II INDICATIONS:  iron deficiency anemia.  His story of esophageal stricture dilated in 2005 and again in December 2000 and.  Solid food dysphagia. MEDICATIONS: MAC sedation, administered by CRNA and Propofol (Diprivan) 230 mg IV TOPICAL ANESTHETIC: none  DESCRIPTION OF PROCEDURE: After the risks benefits and alternatives of the procedure were thoroughly explained, informed consent was obtained.  The LB [id number] endoscope was introduced through the mouth and advanced to the second portion of the duodenum. Without limitations.  The instrument was slowly withdrawn as the mucosa was fully examined.      Esophagus: proximal and mid esophageal mucosa was normal. There was a moderately severe benign appearing esophageal stricture at the level of 30 cm from the incisors which barely allowed the endoscope to pass through with some resistance, there was small amount of bleeding from the stricture. Mucosa of the stricture was friable and edematous. Multiple biopsies were obtained to rule out Barrett's esophagus or dysplasia  Stomach: there was a 5 cm nonreducible hiatal hernia extending from 30-35 cm from the incisors. There were no Cameron erosions. Gastric antrum showed multiple erosions with some coffee-ground material coating the mucosa. Gastric outlet was normal. Retroflexion of the endoscope confirmed the presence of large bottle hernia  Duodenum: the multiple superficial ulcerations in the duodenal bulb and descending duodenum. There was no active bleeding. The largest ulceration measured about 10 mm.  Biopsies were obtained  Guidewire was then placed into the stomach and the Savary dilators passed into the stomach starting with 12, 13 and 14 mm dilators there was blood on each dilator[          The scope was then withdrawn from the patient and the procedure completed.  COMPLICATIONS: There were no complications. ENDOSCOPIC IMPRESSION:  moderately severe distal esophageal stricture status post dilation to 14 mm Distal esophagitis status post biopsies Large 5 cm nonreducible hiatal hernia Erosive gastritis Multiple duodenal ulcerations involving bulb and descending duodenum. Status post biopsies  The above findings most likely explain patient's GI blood loss RECOMMENDATIONS: 1.  Await pathology results 2.  Anti-reflux regimen to be follow 3.  Continue PPI 4.  Proceed with a Colonoscopy.  REPEAT EXAM: for EGD pending biopsy results.  eSigned:  Lafayette Dragon, MD 09/19/2013 2:16 PM   CC:  PATIENT NAME:  Alexis Banks, Alexis Banks MR#: [id number]

## 2013-09-19 NOTE — Op Note (Signed)
[place]  [address]. [place], [address]   COLONOSCOPY PROCEDURE REPORT  PATIENT: Alexis, Banks  MR#: [id number] BIRTHDATE: [birth year] , 68  yrs. old GENDER: Female ENDOSCOPIST: Lafayette Dragon, MD REFERRED NG:[id number] Birdie Riddle, M.D. PROCEDURE DATE:  09/19/2013 PROCEDURE:   Colonoscopy, diagnostic First Screening Colonoscopy - Avg.  risk and is 50 yrs.  old or older - No.  Prior Negative Screening - Now for repeat screening. Other: See Comments  History of Adenoma - Now for follow-up colonoscopy & has been > or = to 3 yrs.  N/A  Polyps Removed Today? No.  Recommend repeat exam, <10 yrs? No. ASA CLASS:   Class II INDICATIONS:iron deficiency anemia.  Last colonoscopy in 2010 was normal ( diminutive polyps). MEDICATIONS: MAC sedation, administered by CRNA and Propofol (Diprivan) 170 mg IV  DESCRIPTION OF PROCEDURE:   After the risks benefits and alternatives of the procedure were thoroughly explained, informed consent was obtained.  A digital rectal exam revealed no abnormalities of the rectum.   The LB [id number]  endoscope was introduced through the anus and advanced to the cecum, which was identified by both the appendix and ileocecal valve. No adverse events experienced.   The quality of the prep was good, using MoviPrep  The instrument was then slowly withdrawn as the colon was fully examined.      COLON FINDINGS: A normal appearing cecum, ileocecal valve, and appendiceal orifice were identified.  The ascending, hepatic flexure, transverse, splenic flexure, descending, sigmoid colon and rectum appeared unremarkable.  No polyps or cancers were seen. There was moderate diverticulosis noted in the sigmoid colon with associated angulation, tortuosity and muscular hypertrophy. Retroflexed views revealed no abnormalities. The time to cecum=6 minutes 28 seconds.  Withdrawal time=6 minutes 03 seconds.  The scope was withdrawn and the procedure  completed. COMPLICATIONS: There were no complications.  ENDOSCOPIC IMPRESSION: 1.   Normal colon 2.   There was moderate diverticulosis noted in the sigmoid colon  RECOMMENDATIONS: high fiber diet Recall colonoscopy in 10 years Iron supplements Monitor hemoglobin and hematocrit   eSigned:  Lafayette Dragon, MD 09/19/2013 2:21 PM   cc:   PATIENT NAME:  Alexis, Banks MR#: [id number]

## 2013-09-19 NOTE — Progress Notes (Signed)
Procedure ends, to recovery, report given and VSS. 

## 2013-09-19 NOTE — Progress Notes (Signed)
Called to room to assist during endoscopic procedure.  Patient ID and intended procedure confirmed with present staff. Received instructions for my participation in the procedure from the performing physician.  

## 2013-09-20 ENCOUNTER — Telehealth: Payer: Self-pay | Admitting: *Deleted

## 2013-09-20 NOTE — Telephone Encounter (Signed)
  Follow up Call-  Call back number 09/19/2013  Post procedure Call Back phone  # [phone]  Permission to leave phone message Yes     Patient questions:  Do you have a fever, pain , or abdominal swelling? No. Pain Score  0 *  Have you tolerated food without any problems? Yes.    Have you been able to return to your normal activities? Yes.    Do you have any questions about your discharge instructions: Diet   No. Medications  No. Follow up visit  No.  Do you have questions or concerns about your Care? No.  Actions: * If pain score is 4 or above: No action needed, pain <4.

## 2013-09-28 ENCOUNTER — Encounter: Payer: Self-pay | Admitting: Internal Medicine

## 2013-10-16 ENCOUNTER — Telehealth: Payer: Self-pay | Admitting: General Practice

## 2013-10-16 NOTE — Telephone Encounter (Signed)
Called pt to see if I could get her to come in on 10/25/13 at either 9:45 or 11 on that same day. Pt was scheduled in a Hospital Discharge appt and only needs a 15 minute appt.

## 2013-10-17 NOTE — Telephone Encounter (Signed)
Patient [facility] for 9:45 (same day) 10/25/13.

## 2013-10-18 ENCOUNTER — Other Ambulatory Visit: Payer: Self-pay | Admitting: Family Medicine

## 2013-10-25 ENCOUNTER — Ambulatory Visit: Payer: Medicare HMO | Admitting: Family Medicine

## 2013-10-25 ENCOUNTER — Encounter: Payer: Self-pay | Admitting: Family Medicine

## 2013-10-25 ENCOUNTER — Ambulatory Visit (INDEPENDENT_AMBULATORY_CARE_PROVIDER_SITE_OTHER): Payer: Medicare HMO | Admitting: Family Medicine

## 2013-10-25 VITALS — BP 124/78 | HR 49 | Temp 97.9°F | Resp 16 | Wt 178.0 lb

## 2013-10-25 DIAGNOSIS — D509 Iron deficiency anemia, unspecified: Secondary | ICD-10-CM

## 2013-10-25 DIAGNOSIS — Z23 Encounter for immunization: Secondary | ICD-10-CM

## 2013-10-25 LAB — CBC WITH DIFFERENTIAL/PLATELET
Basophils Absolute: 0 10*3/uL (ref 0.0–0.1)
Basophils Relative: 0.5 % (ref 0.0–3.0)
EOS PCT: 4 % (ref 0.0–5.0)
Eosinophils Absolute: 0.2 10*3/uL (ref 0.0–0.7)
HEMATOCRIT: 35.5 % — AB (ref 36.0–46.0)
HEMOGLOBIN: 11.3 g/dL — AB (ref 12.0–15.0)
LYMPHS PCT: 35.2 % (ref 12.0–46.0)
Lymphs Abs: 2.1 10*3/uL (ref 0.7–4.0)
MCHC: 31.9 g/dL (ref 30.0–36.0)
MCV: 76.8 fl — ABNORMAL LOW (ref 78.0–100.0)
MONOS PCT: 8.1 % (ref 3.0–12.0)
Monocytes Absolute: 0.5 10*3/uL (ref 0.1–1.0)
NEUTROS ABS: 3.1 10*3/uL (ref 1.4–7.7)
Neutrophils Relative %: 52.2 % (ref 43.0–77.0)
Platelets: 305 10*3/uL (ref 150.0–400.0)
RBC: 4.63 Mil/uL (ref 3.87–5.11)
RDW: 19.3 % — ABNORMAL HIGH (ref 11.5–15.5)
WBC: 6 10*3/uL (ref 4.0–10.5)

## 2013-10-25 LAB — IBC PANEL
Iron: 45 ug/dL (ref 42–145)
SATURATION RATIOS: 9.3 % — AB (ref 20.0–50.0)
Transferrin: 347.4 mg/dL (ref 212.0–360.0)

## 2013-10-25 MED ORDER — NITROGLYCERIN 0.4 MG SL SUBL: 0.4000 mg | SUBLINGUAL_TABLET | SUBLINGUAL | Status: DC | PRN

## 2013-10-25 NOTE — Patient Instructions (Signed)
Follow up in January to recheck BP and cholesterol (this will be our 6 month f/u from the physical) We'll notify you of your blood counts and your iron levels Keep up the good work!  You look great! Call with any questions or concerns Happy Fall!!!

## 2013-10-25 NOTE — Assessment & Plan Note (Signed)
Pt was noted to have low Hgb on labs done in June.  Had GI work-up that showed gastritis and multiple duodenal ulcers.  Now on daily PPI and iron supplements.  sxs are much improved- pt w/ much better energy.  Check labs today to determine if levels and iron stores have improved.  Will follow.

## 2013-10-25 NOTE — Progress Notes (Signed)
   Subjective:    Patient ID: Alexis Banks, female    DOB: [birth year], 68 y.o.   MRN: [id number]  HPI Iron deficiency anemia- noted on labs done in June.  Saw GI and had colonoscopy and endoscopy done in September.  Colonoscopy was clear, endoscopy showed gastritis and multiple duodenal ulcers.  Taking Prilosec daily.  Pt on 2 iron pills every other day.  Pt reports energy has improved.  Not pale.     Review of Systems For ROS see HPI     Objective:   Physical Exam  Vitals reviewed. Constitutional: She is oriented to person, place, and time. She appears well-developed and well-nourished. No distress.  HENT:  Head: Normocephalic and atraumatic.  Eyes: Conjunctivae and EOM are normal. Pupils are equal, round, and reactive to light.  Neck: Normal range of motion. Neck supple. No thyromegaly present.  Cardiovascular: Normal rate, regular rhythm, normal heart sounds and intact distal pulses.   No murmur heard. Pulmonary/Chest: Effort normal and breath sounds normal. No respiratory distress.  Abdominal: Soft. She exhibits no distension. There is no tenderness.  Musculoskeletal: She exhibits no edema.  Lymphadenopathy:    She has no cervical adenopathy.  Neurological: She is alert and oriented to person, place, and time.  Skin: Skin is warm and dry.  Psychiatric: She has a normal mood and affect. Her behavior is normal.          Assessment & Plan:

## 2013-10-25 NOTE — Progress Notes (Signed)
Pre visit review using our clinic review tool, if applicable. No additional management support is needed unless otherwise documented below in the visit note. 

## 2013-10-26 ENCOUNTER — Telehealth: Payer: Self-pay

## 2013-10-26 NOTE — Telephone Encounter (Signed)
Alexis Banks [phone]  Lerae returned your call.

## 2013-10-26 NOTE — Telephone Encounter (Signed)
Patient called back stating to call her on her cell phone. [phone]

## 2013-10-26 NOTE — Telephone Encounter (Signed)
Pt notified of results

## 2013-10-29 ENCOUNTER — Telehealth: Payer: Self-pay | Admitting: Family Medicine

## 2013-10-29 NOTE — Telephone Encounter (Signed)
Pt would like to know if dr. Birdie Riddle would accept her husband as a new pt, pt is aware that she is not accepting pt at this time however still wanted to ask.  Pt states you can call her back anytime after 11am.

## 2013-10-29 NOTE — Telephone Encounter (Signed)
Left detailed message informing patient of this. °

## 2013-10-29 NOTE — Telephone Encounter (Signed)
I would be glad to accept her husband!  We are certainly always willing to take family members of current pts.

## 2013-10-29 NOTE — Telephone Encounter (Signed)
Please call and schedule pt

## 2013-11-15 ENCOUNTER — Encounter: Payer: Self-pay | Admitting: Family Medicine

## 2013-11-27 ENCOUNTER — Other Ambulatory Visit: Payer: Self-pay | Admitting: Family Medicine

## 2013-11-27 NOTE — Telephone Encounter (Signed)
Med filled.  

## 2013-12-17 ENCOUNTER — Telehealth: Payer: Self-pay

## 2013-12-17 NOTE — Telephone Encounter (Signed)
[place]  Triage Call Report Triage Record Num: [id number] Operator: Rosendo Gros DiMatteis Patient Name: Alexis Banks Call Date & Time: 12/15/2013 9:02:04AM Patient Phone: [phone] PCP: Dimple Nanas Patient Gender: Female PCP Fax : [phone] Patient DOB: Oct 24, 1945 Practice Name: [facility]  Reason for Call: [name]; PCP: Midge Minium.; CB#: [phone]; Call regarding throat irritation; sx started 12/14/2013; no fever; took Zycam with some relief; throat feels raw; Triaged per Sore Throat or Hoarseness Guideline; Homecare due to sore throat and no other sx; homecare and call back instructions reviewed;  Protocol(s) Used: Sore Throat or Hoarseness Recommended Outcome per Protocol: Provide Home/Self Care Reason for Outcome: Sore throat AND no other symptoms  Care Advice: ~Drink more fluids -- water, low-sugar juices, tea and warm soup, especially chicken broth, are options. Avoid caffeinated or alcoholic beverages because they can increase the chance of dehydration. ~Analgesic/Antipyretic Advice - Acetaminophen: Consider acetaminophen as directed on label or by pharmacist/provider for pain or fever PRECAUTIONS: - Use if there is no history of liver disease, alcoholism, or intake of three or more alcohol drinks per day - Only if approved by provider during pregnancy or when breastfeeding - During pregnancy, acetaminophen should not be taken more than 3 consecutive days without telling provider - Do not exceed recommended dose or frequency ~ See a provider if have sore throat symptoms for 2 weeks or more. ~Sore Throat Relief: - Use salt water gargles (1/2 teaspoon salt in 8 oz. [261mL] warm water) every one to two hours. - Use a vaporizer or cool mist humidifier in the room when sleeping. - Suck on hard candy, nonprescription or herbal throat lozenges (sugar-free if diabetic) - Eat soothing, soft food/fluids (broths, soups, or honey and lemon juice  in hot tea, Popsicles, frozen yogurt or sherbet, scrambled eggs, cooked cereals, Jell-O or puddings) whichever is most comforting. - Avoid eating salty, spicy or acidic foods. ~

## 2013-12-17 NOTE — Telephone Encounter (Signed)
Fyi.

## 2014-01-21 ENCOUNTER — Telehealth: Payer: Self-pay | Admitting: Cardiology

## 2014-01-21 ENCOUNTER — Encounter (HOSPITAL_COMMUNITY): Payer: Self-pay | Admitting: *Deleted

## 2014-01-21 ENCOUNTER — Emergency Department (HOSPITAL_COMMUNITY)
Admission: EM | Admit: 2014-01-21 | Discharge: 2014-01-22 | Disposition: A | Discharge status: Home / Self Care | Payer: Medicare Other | Attending: Emergency Medicine | Admitting: Emergency Medicine

## 2014-01-21 DIAGNOSIS — I10 Essential (primary) hypertension: Secondary | ICD-10-CM | POA: Diagnosis not present

## 2014-01-21 DIAGNOSIS — E559 Vitamin D deficiency, unspecified: Secondary | ICD-10-CM | POA: Insufficient documentation

## 2014-01-21 DIAGNOSIS — Z79899 Other long term (current) drug therapy: Secondary | ICD-10-CM | POA: Insufficient documentation

## 2014-01-21 DIAGNOSIS — M25561 Pain in right knee: Secondary | ICD-10-CM | POA: Insufficient documentation

## 2014-01-21 DIAGNOSIS — I2510 Atherosclerotic heart disease of native coronary artery without angina pectoris: Secondary | ICD-10-CM | POA: Diagnosis not present

## 2014-01-21 DIAGNOSIS — F419 Anxiety disorder, unspecified: Secondary | ICD-10-CM | POA: Insufficient documentation

## 2014-01-21 DIAGNOSIS — Z7982 Long term (current) use of aspirin: Secondary | ICD-10-CM | POA: Diagnosis not present

## 2014-01-21 DIAGNOSIS — D649 Anemia, unspecified: Secondary | ICD-10-CM | POA: Insufficient documentation

## 2014-01-21 DIAGNOSIS — M79604 Pain in right leg: Secondary | ICD-10-CM | POA: Diagnosis present

## 2014-01-21 DIAGNOSIS — E039 Hypothyroidism, unspecified: Secondary | ICD-10-CM | POA: Insufficient documentation

## 2014-01-21 DIAGNOSIS — K219 Gastro-esophageal reflux disease without esophagitis: Secondary | ICD-10-CM | POA: Insufficient documentation

## 2014-01-21 DIAGNOSIS — R252 Cramp and spasm: Secondary | ICD-10-CM

## 2014-01-21 DIAGNOSIS — G4700 Insomnia, unspecified: Secondary | ICD-10-CM | POA: Insufficient documentation

## 2014-01-21 LAB — CBC WITH DIFFERENTIAL/PLATELET
Basophils Absolute: 0 10*3/uL (ref 0.0–0.1)
Basophils Relative: 0 % (ref 0–1)
Eosinophils Absolute: 0.2 10*3/uL (ref 0.0–0.7)
Eosinophils Relative: 4 % (ref 0–5)
HCT: 37.2 % (ref 36.0–46.0)
HEMOGLOBIN: 11.8 g/dL — AB (ref 12.0–15.0)
LYMPHS PCT: 39 % (ref 12–46)
Lymphs Abs: 2.2 10*3/uL (ref 0.7–4.0)
MCH: 26.8 pg (ref 26.0–34.0)
MCHC: 31.7 g/dL (ref 30.0–36.0)
MCV: 84.4 fL (ref 78.0–100.0)
MONOS PCT: 6 % (ref 3–12)
Monocytes Absolute: 0.3 10*3/uL (ref 0.1–1.0)
NEUTROS ABS: 2.9 10*3/uL (ref 1.7–7.7)
NEUTROS PCT: 51 % (ref 43–77)
Platelets: 265 10*3/uL (ref 150–400)
RBC: 4.41 MIL/uL (ref 3.87–5.11)
RDW: 16.9 % — ABNORMAL HIGH (ref 11.5–15.5)
WBC: 5.7 10*3/uL (ref 4.0–10.5)

## 2014-01-21 LAB — COMPREHENSIVE METABOLIC PANEL
ALBUMIN: 3.9 g/dL (ref 3.5–5.2)
ALK PHOS: 95 U/L (ref 39–117)
ALT: 15 U/L (ref 0–35)
ANION GAP: 6 (ref 5–15)
AST: 24 U/L (ref 0–37)
BUN: 25 mg/dL — AB (ref 6–23)
CO2: 25 mmol/L (ref 19–32)
Calcium: 9.5 mg/dL (ref 8.4–10.5)
Chloride: 107 mEq/L (ref 96–112)
Creatinine, Ser: 1.17 mg/dL — ABNORMAL HIGH (ref 0.50–1.10)
GFR calc Af Amer: 54 mL/min — ABNORMAL LOW (ref >90)
GFR calc non Af Amer: 46 mL/min — ABNORMAL LOW (ref >90)
GLUCOSE: 121 mg/dL — AB (ref 70–99)
POTASSIUM: 4.4 mmol/L (ref 3.5–5.1)
Sodium: 138 mmol/L (ref 135–145)
Total Bilirubin: 0.5 mg/dL (ref 0.3–1.2)
Total Protein: 7.1 g/dL (ref 6.0–8.3)

## 2014-01-21 NOTE — ED Notes (Signed)
Patient presents stating about 630pm she started with pain to the right calf area.  Called her cardiologist and they recommended she come be seen for a possible blood clot.  Large hard area noted to the back of her right calf.  Also has had an unexplained cough for about 2 weeks.   No SOB, no CP

## 2014-01-21 NOTE — Telephone Encounter (Signed)
Patient called stating her calf suddenly started hurting very badly and she is worried she may have a blood clot.  She says it is throbbing and may be swollen.  I instructed her to go to [facility] ER and she has agreed to go.

## 2014-01-21 NOTE — ED Provider Notes (Signed)
CSN: [id number]     Arrival date & time 01/21/14  2100 History  This chart was scribed for Janice Norrie, MD by Chester Holstein, ED Scribe. This patient was seen in room A03C/A03C and the patient's care was started at 11:24 PM.     Chief Complaint  Patient presents with  . Leg Pain     Patient is a 69 y.o. female presenting with leg pain.  Leg Pain   HPI Comments: Alexis Banks is a 69 y.o. female who presents to the Emergency Department complaining of leg cramping in right calf with onset at 6:30 PM tonight.  Pt was able to ambulate with relief and squeezing her calf or thigh makes the pain better and states movement of her foot will also make the cramping go away.  She notes she tried [place] and a heating pad with no relief. Pt notes cramping radiated to her lower posterior right thigh.  She states when she gets cramps they are not usually in the calf area. She states she has leg cramps once a month. Usually bengay or heat makes the cramping go away.   Pt denies changes in medication. Pt denies smoking and EtOH use.  Pt is retired. She was seen by cardiologist, Dr. Stanford Breed, in 2011 for a evaluation of SOB and had a normal stress test. Pt denies chest pain, leg swelling, and SOB.  She called her cardiologists office tonight and was told to come to the ED for evaluation tonight.   Pt sees Dr. Birdie Riddle.    Past Medical History  Diagnosis Date  . Hyperlipidemia   . Hypertension   . CORONARY ARTERY DISEASE   . VARICOSE VEINS, LOWER EXTREMITIES   . HYPOTHYROIDISM   . VITAMIN D DEFICIENCY   . ANXIETY DEPRESSION   . GERD   . ROTATOR CUFF SYNDROME, RIGHT   . INSOMNIA   . Trochanteric bursitis of left hip   . Common bile duct dilation   . Anemia    Past Surgical History  Procedure Laterality Date  . Cholecystectomy    . Rotator cuff surgery     Family History  Problem Relation Age of Onset  . Heart disease Neg Hx   . COPD Father   . Alcohol abuse Mother   . Alzheimer's disease Sister     History  Substance Use Topics  . Smoking status: Never Smoker   . Smokeless tobacco: Never Used  . Alcohol Use: No   Retired Lives at home Lives with spouse  OB History    No data available     Review of Systems  Respiratory: Negative for shortness of breath.   Cardiovascular: Negative for chest pain and leg swelling.  Musculoskeletal: Positive for arthralgias.  All other systems reviewed and are negative.     Allergies  Adhesive  Home Medications   Prior to Admission medications   Medication Sig Start Date End Date Taking? Authorizing Provider  aspirin 81 MG tablet Take 81 mg by mouth daily.     Yes Historical Provider, MD  benazepril (LOTENSIN) 20 MG tablet TAKE ONE TABLET BY MOUTH ONCE DAILY 10/18/13  Yes Eulas Post, MD  Calcium Carb-Cholecalciferol (CALCIUM-VITAMIN D3) 600-500 MG-UNIT CAPS Take 1 capsule by mouth daily.   Yes Historical Provider, MD  Iron Combinations (IRON COMPLEX PO) Take 130 mg by mouth every other day.   Yes Historical Provider, MD  levothyroxine (SYNTHROID, LEVOTHROID) 125 MCG tablet TAKE 1 TABLET BY MOUTH DAILY 07/17/13  Yes  Midge Minium, MD  lovastatin (MEVACOR) 40 MG tablet take 1 tablet by mouth at bedtime 11/27/13  Yes Midge Minium, MD  Multiple Vitamin (MULTIVITAMIN) tablet Take 1 tablet by mouth daily.     Yes Historical Provider, MD  nitroGLYCERIN (NITROSTAT) 0.4 MG SL tablet Place 1 tablet (0.4 mg total) under the tongue every 5 (five) minutes as needed for chest pain. 10/25/13 05/16/15 Yes Midge Minium, MD  omeprazole (PRILOSEC) 40 MG capsule Take 1 capsule (40 mg total) by mouth daily. 09/19/13  Yes Lafayette Dragon, MD  propranolol (INDERAL) 20 MG tablet TAKE ONE TABLET BY MOUTH TWICE DAILY Patient taking differently: Take 10 mg by mouth 2 (two) times daily.  08/14/13  Yes Midge Minium, MD  Psyllium (METAMUCIL MULTIHEALTH FIBER PO) Take by mouth.    Historical Provider, MD  zolpidem (AMBIEN) 10 MG tablet Take 1  tablet (10 mg total) by mouth at bedtime as needed for sleep. Patient not taking: Reported on 01/22/2014 07/02/13   Midge Minium, MD   BP 164/86 mmHg  Pulse 71  Temp(Src) 98.2 F (36.8 C) (Oral)  Resp 15  Ht 5\' 3"  (1.6 m)  Wt 178 lb (80.74 kg)  BMI 31.54 kg/m2  SpO2 94%  Vital signs normal except hypertension  Physical Exam  Constitutional: She is oriented to person, place, and time. She appears well-developed and well-nourished.  Non-toxic appearance. She does not appear ill. No distress.  HENT:  Head: Normocephalic and atraumatic.  Right Ear: External ear normal.  Left Ear: External ear normal.  Nose: Nose normal. No mucosal edema or rhinorrhea.  Mouth/Throat: Oropharynx is clear and moist and mucous membranes are normal. No dental abscesses or uvula swelling.  Eyes: Conjunctivae and EOM are normal. Pupils are equal, round, and reactive to light.  Neck: Normal range of motion and full passive range of motion without pain. Neck supple.  Cardiovascular: Normal rate, regular rhythm and normal heart sounds.  Exam reveals no gallop and no friction rub.   No murmur heard. Pulmonary/Chest: Effort normal and breath sounds normal. No respiratory distress. She has no wheezes. She has no rhonchi. She has no rales. She exhibits no tenderness and no crepitus.  Abdominal: Soft. Normal appearance and bowel sounds are normal. She exhibits no distension. There is no tenderness. There is no rebound and no guarding.  Musculoskeletal: Normal range of motion. She exhibits no edema or tenderness.  Moves all extremities well.  Calf soft without masses, tender over hamstrings in right posterior knee.  Non-tender medial right thigh.  Neurological: She is alert and oriented to person, place, and time. She has normal strength. No cranial nerve deficit.  Skin: Skin is warm, dry and intact. No rash noted. No erythema. No pallor.  Psychiatric: She has a normal mood and affect. Her speech is normal and  behavior is normal. Her mood appears not anxious.  Nursing note and vitals reviewed.   ED Course  Procedures (including critical care time)  Medications  enoxaparin (LOVENOX) injection 80 mg (not administered)    DIAGNOSTIC STUDIES: Oxygen Saturation is 97% on room air, normal by my interpretation.    COORDINATION OF CARE: 11:34 PM Discussed treatment plan with patient at beside, the patient agrees with the plan and has no further questions at this time.  Patient was given her test results. She was given Lovenox 1 mg per KG subcutaneous and will return in the morning to have an outpatient Doppler ultrasound of her leg.  Labs Review Results for orders placed or performed during the hospital encounter of 01/21/14  CBC with Differential  Result Value Ref Range   WBC 5.7 4.0 - 10.5 K/uL   RBC 4.41 3.87 - 5.11 MIL/uL   Hemoglobin 11.8 (L) 12.0 - 15.0 g/dL   HCT 37.2 36.0 - 46.0 %   MCV 84.4 78.0 - 100.0 fL   MCH 26.8 26.0 - 34.0 pg   MCHC 31.7 30.0 - 36.0 g/dL   RDW 16.9 (H) 11.5 - 15.5 %   Platelets 265 150 - 400 K/uL   Neutrophils Relative % 51 43 - 77 %   Neutro Abs 2.9 1.7 - 7.7 K/uL   Lymphocytes Relative 39 12 - 46 %   Lymphs Abs 2.2 0.7 - 4.0 K/uL   Monocytes Relative 6 3 - 12 %   Monocytes Absolute 0.3 0.1 - 1.0 K/uL   Eosinophils Relative 4 0 - 5 %   Eosinophils Absolute 0.2 0.0 - 0.7 K/uL   Basophils Relative 0 0 - 1 %   Basophils Absolute 0.0 0.0 - 0.1 K/uL  Comprehensive metabolic panel  Result Value Ref Range   Sodium 138 135 - 145 mmol/L   Potassium 4.4 3.5 - 5.1 mmol/L   Chloride 107 96 - 112 mEq/L   CO2 25 19 - 32 mmol/L   Glucose, Bld 121 (H) 70 - 99 mg/dL   BUN 25 (H) 6 - 23 mg/dL   Creatinine, Ser 1.17 (H) 0.50 - 1.10 mg/dL   Calcium 9.5 8.4 - 10.5 mg/dL   Total Protein 7.1 6.0 - 8.3 g/dL   Albumin 3.9 3.5 - 5.2 g/dL   AST 24 0 - 37 U/L   ALT 15 0 - 35 U/L   Alkaline Phosphatase 95 39 - 117 U/L   Total Bilirubin 0.5 0.3 - 1.2 mg/dL   GFR calc  non Af Amer 46 (L) >90 mL/min   GFR calc Af Amer 54 (L) >90 mL/min   Anion gap 6 5 - 15  D-dimer, quantitative  Result Value Ref Range   D-Dimer, Quant 0.43 0.00 - 0.48 ug/mL-FEU   Laboratory interpretation all normal except for renal insufficiency    Imaging Review No results found.   EKG Interpretation None      MDM   Final diagnoses:  Cramps of right lower extremity   Plan discharge to return in am to get outpatient doppler US of her RLE  Rolland Porter, MD, FACEP  I personally performed the services described in this documentation, which was scribed in my presence. The recorded information has been reviewed and considered.  Rolland Porter, MD, FACEP     Janice Norrie, MD 01/22/14 [phone]

## 2014-01-22 ENCOUNTER — Telehealth: Payer: Self-pay | Admitting: *Deleted

## 2014-01-22 ENCOUNTER — Ambulatory Visit (HOSPITAL_COMMUNITY)
Admission: RE | Admit: 2014-01-22 | Discharge: 2014-01-22 | Disposition: A | Discharge status: Home / Self Care | Payer: Medicare HMO | Source: Ambulatory Visit | Attending: Emergency Medicine | Admitting: Emergency Medicine

## 2014-01-22 DIAGNOSIS — R252 Cramp and spasm: Secondary | ICD-10-CM | POA: Insufficient documentation

## 2014-01-22 DIAGNOSIS — M79661 Pain in right lower leg: Secondary | ICD-10-CM | POA: Insufficient documentation

## 2014-01-22 DIAGNOSIS — M79609 Pain in unspecified limb: Secondary | ICD-10-CM

## 2014-01-22 LAB — TROPONIN I: Troponin I: <0.03 ng/mL (ref <0.031)

## 2014-01-22 LAB — D-DIMER, QUANTITATIVE: D-Dimer, Quant: 0.43 ug/mL-FEU (ref 0.00–0.48)

## 2014-01-22 MED ORDER — ENOXAPARIN SODIUM 100 MG/ML ~~LOC~~ SOLN
80.0000 mg | Freq: Once | SUBCUTANEOUS | Status: AC
  Administered 2014-01-22: 80 mg via SUBCUTANEOUS
  Filled 2014-01-22: qty 1

## 2014-01-22 NOTE — ED Notes (Signed)
Spoke to Alexis Banks  in lab and will add Troponin to blood already drawn.

## 2014-01-22 NOTE — Progress Notes (Signed)
VASCULAR LAB PRELIMINARY  PRELIMINARY  PRELIMINARY  PRELIMINARY  Right lower extremity venous duplex completed.    Preliminary report:  Negative DVT right lower extremity.  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  Lindwood Coke, RVT 01/22/2014, 8:46 AM

## 2014-01-22 NOTE — Telephone Encounter (Signed)
Caller name: [name] Relation to pt: self Call back number:  Pharmacy:  Reason for call: Pt spoke with Dr. Radford Pax and was advised to go to ER last night due to right leg cramp.  Pt went and was sent home stating everything looked OK, but scheduled her for ultrasound this morning. They told her after it was read, results would be sent to [facility].  Pt request for [facility] to look over.  Pt has appointment with Birdie Riddle 01/28/2014 and wanted to make sure she did not need to come in sooner.

## 2014-01-22 NOTE — Discharge Instructions (Signed)
Return in the morning to outpatient registration to go to the Vascular Lab to get your doppler ultrasound of your leg done to check you for a blood clot. If you have a clot you will be directed back to the ED. If it does not show a clot, follow up with your primary care doctor.   Return to the ED if you get chest pain or shortness of breath.    Leg Cramps Leg cramps that occur during exercise can be caused by poor circulation or dehydration. However, muscle cramps that occur at rest or during the night are usually not due to any serious medical problem. Heat cramps may cause muscle spasms during hot weather.  CAUSES There is no clear cause for muscle cramps. However, dehydration may be a factor for those who do not drink enough fluids and those who exercise in the heat. Imbalances in the level of sodium, potassium, calcium or magnesium in the muscle tissue may also be a factor. Some medications, such as water pills (diuretics), may cause loss of chemicals that the body needs (like sodium and potassium) and cause muscle cramps. TREATMENT   Make sure your diet has enough fluids and essential minerals for the muscle to work normally.  Avoid strenuous exercise for several days if you have been having frequent leg cramps.  Stretch and massage the cramped muscle for several minutes.  Some medicines may be helpful in some patients with night cramps. Only take over-the-counter or prescription medicines as directed by your caregiver. SEEK IMMEDIATE MEDICAL CARE IF:   Your leg cramps become worse.  Your foot becomes cold, numb, or blue. Document Released: 02/12/2004 Document Revised: 03/29/2011 Document Reviewed: 01/30/2008 [facility] Patient Information 2015 [place], [place]. This information is not intended to replace advice given to you by your health care provider. Make sure you discuss any questions you have with your health care provider.

## 2014-01-23 NOTE — Telephone Encounter (Signed)
Called pt and lmovm informing her.

## 2014-01-23 NOTE — Telephone Encounter (Signed)
Normal LE doppler

## 2014-01-28 ENCOUNTER — Ambulatory Visit: Payer: Medicare HMO | Admitting: Family Medicine

## 2014-01-31 ENCOUNTER — Ambulatory Visit (INDEPENDENT_AMBULATORY_CARE_PROVIDER_SITE_OTHER): Payer: Medicare Other | Admitting: Family Medicine

## 2014-01-31 ENCOUNTER — Encounter: Payer: Self-pay | Admitting: General Practice

## 2014-01-31 ENCOUNTER — Encounter: Payer: Self-pay | Admitting: Family Medicine

## 2014-01-31 VITALS — BP 140/82 | HR 82 | Temp 98.7°F | Resp 18 | Wt 177.4 lb

## 2014-01-31 DIAGNOSIS — K2100 Gastro-esophageal reflux disease with esophagitis, without bleeding: Secondary | ICD-10-CM

## 2014-01-31 DIAGNOSIS — I10 Essential (primary) hypertension: Secondary | ICD-10-CM

## 2014-01-31 DIAGNOSIS — E785 Hyperlipidemia, unspecified: Secondary | ICD-10-CM

## 2014-01-31 DIAGNOSIS — E038 Other specified hypothyroidism: Secondary | ICD-10-CM

## 2014-01-31 DIAGNOSIS — K222 Esophageal obstruction: Secondary | ICD-10-CM

## 2014-01-31 LAB — LIPID PANEL
CHOL/HDL RATIO: 3
CHOLESTEROL: 145 mg/dL (ref 0–200)
HDL: 53.3 mg/dL (ref >39.00)
LDL CALC: 66 mg/dL (ref 0–99)
NONHDL: 91.7
Triglycerides: 128 mg/dL (ref 0.0–149.0)
VLDL: 25.6 mg/dL (ref 0.0–40.0)

## 2014-01-31 LAB — BASIC METABOLIC PANEL
BUN: 25 mg/dL — AB (ref 6–23)
CALCIUM: 10.1 mg/dL (ref 8.4–10.5)
CHLORIDE: 106 meq/L (ref 96–112)
CO2: 27 meq/L (ref 19–32)
Creatinine, Ser: 0.87 mg/dL (ref 0.40–1.20)
GFR: 68.42 mL/min (ref >60.00)
Glucose, Bld: 128 mg/dL — ABNORMAL HIGH (ref 70–99)
POTASSIUM: 4.3 meq/L (ref 3.5–5.1)
Sodium: 143 mEq/L (ref 135–145)

## 2014-01-31 LAB — TSH: TSH: 1.62 u[IU]/mL (ref 0.35–4.50)

## 2014-01-31 MED ORDER — PROPRANOLOL HCL 20 MG PO TABS: 10.0000 mg | ORAL_TABLET | Freq: Two times a day (BID) | ORAL | Status: DC

## 2014-01-31 MED ORDER — OMEPRAZOLE 40 MG PO CPDR: 40.0000 mg | DELAYED_RELEASE_CAPSULE | Freq: Every day | ORAL | Status: DC

## 2014-01-31 MED ORDER — LEVOTHYROXINE SODIUM 125 MCG PO TABS: ORAL_TABLET | ORAL | Status: DC

## 2014-01-31 MED ORDER — BENAZEPRIL HCL 20 MG PO TABS: 20.0000 mg | ORAL_TABLET | Freq: Every day | ORAL | Status: DC

## 2014-01-31 MED ORDER — LOVASTATIN 40 MG PO TABS: 40.0000 mg | ORAL_TABLET | Freq: Every day | ORAL | Status: DC

## 2014-01-31 MED ORDER — ZOLPIDEM TARTRATE 10 MG PO TABS: 10.0000 mg | ORAL_TABLET | Freq: Every evening | ORAL | Status: DC | PRN

## 2014-01-31 NOTE — Patient Instructions (Signed)
Schedule your complete physical in 6 months We'll notify you of your lab results and make any changes if needed Keep up the good work!  You look great! Call with any questions or concerns Happy Early Birthday!!! 

## 2014-01-31 NOTE — Progress Notes (Signed)
Pre visit review using our clinic review tool, if applicable. No additional management support is needed unless otherwise documented below in the visit note. 

## 2014-01-31 NOTE — Progress Notes (Signed)
   Subjective:    Patient ID: Alexis Banks, female    DOB: 08-14-1945, 69 y.o.   MRN: [id number]  HPI HTN- chronic problem, on Benazpril, Propranolol.  Adequate control today.  Denies CP, SOB, HAs, visual changes, edema.  Hyperlipidemia- chronic problem, on Lovastatin.  Denies abd pain, N/V, myalgias.  Hypothyroid- chronic problem, on Levothyroxine.  Denies excessive fatigue.   Review of Systems For ROS see HPI     Objective:   Physical Exam  Constitutional: She is oriented to person, place, and time. She appears well-developed and well-nourished. No distress.  HENT:  Head: Normocephalic and atraumatic.  Eyes: Conjunctivae and EOM are normal. Pupils are equal, round, and reactive to light.  Neck: Normal range of motion. Neck supple. No thyromegaly present.  Cardiovascular: Normal rate, regular rhythm, normal heart sounds and intact distal pulses.   No murmur heard. Pulmonary/Chest: Effort normal and breath sounds normal. No respiratory distress.  Abdominal: Soft. She exhibits no distension. There is no tenderness.  Musculoskeletal: She exhibits no edema.  Lymphadenopathy:    She has no cervical adenopathy.  Neurological: She is alert and oriented to person, place, and time.  Skin: Skin is warm and dry.  Psychiatric: She has a normal mood and affect. Her behavior is normal.  Vitals reviewed.         Assessment & Plan:

## 2014-02-01 ENCOUNTER — Telehealth: Payer: Self-pay | Admitting: Family Medicine

## 2014-02-01 NOTE — Assessment & Plan Note (Signed)
Chronic problem.  Asymptomatic.  Check labs.  Adjust meds prn  

## 2014-02-01 NOTE — Telephone Encounter (Signed)
Caller name: Raymonde Relation to pt: self Call back number: [phone] Pharmacy:  Reason for call:   Patient states that she needs our office to call [facility] regarding all medications. She states that she talked to [facility] and they told her that no form was needed, that someone from our office needed to call them. Order # [id number]  P: [phone] F: [phone]

## 2014-02-01 NOTE — Assessment & Plan Note (Signed)
Chronic problem.  Tolerating statin w/o difficulty.  Check labs.  Adjust meds prn  

## 2014-02-01 NOTE — Assessment & Plan Note (Signed)
Chronic problem.  Adequate but not ideal control today.  Asymptomatic.  Check labs.  No anticipated med changes.

## 2014-02-07 ENCOUNTER — Telehealth: Payer: Self-pay | Admitting: Family Medicine

## 2014-02-07 NOTE — Telephone Encounter (Signed)
Caller name:[name] Relationship to patient:Self Can be [phone] Pharmacy:  Reason for call:PT requesting call back from nurse regarding her latest lab results she recvd in the mail

## 2014-02-07 NOTE — Telephone Encounter (Signed)
Pt notified of results and cpe made for July.

## 2014-04-17 DIAGNOSIS — H52221 Regular astigmatism, right eye: Secondary | ICD-10-CM | POA: Diagnosis not present

## 2014-04-17 DIAGNOSIS — H5203 Hypermetropia, bilateral: Secondary | ICD-10-CM | POA: Diagnosis not present

## 2014-04-17 DIAGNOSIS — H04123 Dry eye syndrome of bilateral lacrimal glands: Secondary | ICD-10-CM | POA: Diagnosis not present

## 2014-04-17 DIAGNOSIS — H524 Presbyopia: Secondary | ICD-10-CM | POA: Diagnosis not present

## 2014-04-25 ENCOUNTER — Ambulatory Visit: Payer: Medicare HMO | Admitting: Family Medicine

## 2014-06-12 ENCOUNTER — Other Ambulatory Visit: Payer: Self-pay | Admitting: Family Medicine

## 2014-06-12 DIAGNOSIS — Z1231 Encounter for screening mammogram for malignant neoplasm of breast: Secondary | ICD-10-CM

## 2014-06-19 ENCOUNTER — Ambulatory Visit (HOSPITAL_COMMUNITY)
Admission: RE | Admit: 2014-06-19 | Discharge: 2014-06-19 | Disposition: A | Discharge status: Home / Self Care | Payer: Medicare Other | Source: Ambulatory Visit | Attending: Family Medicine | Admitting: Family Medicine

## 2014-06-19 DIAGNOSIS — Z1231 Encounter for screening mammogram for malignant neoplasm of breast: Secondary | ICD-10-CM | POA: Diagnosis not present

## 2014-06-21 ENCOUNTER — Other Ambulatory Visit: Payer: Self-pay | Admitting: Family Medicine

## 2014-06-24 NOTE — Telephone Encounter (Signed)
Med filled.  

## 2014-06-27 ENCOUNTER — Telehealth: Payer: Self-pay | Admitting: Family Medicine

## 2014-06-27 NOTE — Telephone Encounter (Signed)
Relation to pt: self  Call back number: [phone] Pharmacy: [facility] [phone]    Reason for call:   Pt states she has a "mouth ulcer" requesting magic mouth wash pt states MD has prescribed in the past

## 2014-06-27 NOTE — Telephone Encounter (Signed)
Pt needs an appt for evaluation. Can be with any provider.

## 2014-06-28 NOTE — Telephone Encounter (Signed)
Relation to pt: self  Call back number: [place] (pharmacy change)  Reason for call:  Pt states she does not feel like its necessary to be seen due to this being a reoccuring issues and she refuses to see any other provider today only PCP. Is there any way pt can be seen today by PCP. Please advise.

## 2014-06-28 NOTE — Telephone Encounter (Signed)
Noted could you please advise her on this?

## 2014-06-28 NOTE — Telephone Encounter (Signed)
Called and Spoke with pt. She advised that she did not feel the need to come in the office for a little cut. Pt was given pt advise for OTC medications.    Pt is very upset, would like to know why her appt for 08/01/14 was cancelled with tabori. Stated she thought she had a CPE at [facility] now she has an appt in October. Can you look into this for me?

## 2014-06-28 NOTE — Telephone Encounter (Signed)
Called pt- lm on vm with contact information

## 2014-06-28 NOTE — Telephone Encounter (Signed)
Unfortunately we don't have any record of prescribing this and there is no mention of oral ulcers on her problem list.  I have no appts to evaluate.  Can either see another provider or can try OTC orajel/ambasol/colgate aphthous ulcer paste

## 2014-06-28 NOTE — Telephone Encounter (Signed)
PT rescheduled her 08/01/14 CPE on 04/26/14 at 1:44pm- her CPE is on  September 30, 2014 9 am-  not October as mentioned below. No reason was given for the reschedule request, but it was not cancelled.

## 2014-07-01 NOTE — Telephone Encounter (Signed)
Caller name: Saphira Lahmann Relationship to patient: self Can be reached: [phone] Pharmacy:  Reason for call: Pt requesting a return call.

## 2014-07-03 NOTE — Telephone Encounter (Signed)
Pt returning your call

## 2014-08-01 ENCOUNTER — Encounter: Payer: Medicare Other | Admitting: Family Medicine

## 2014-09-09 NOTE — Telephone Encounter (Signed)
Late entry:  Notes: medicare wellness/gd Rescheduled: Canceled: 04/26/2014 1:44 PM 09/05/2014 1:54 PM By: Trenton Gammon S By: Rudene Anda Cancel Rsn: Patient (Pt plans to find a new doctor due to issues with office staff. She says that she loves Dr. Birdie Riddle and If things changes, she would like to talk to her about coming back to her.)

## 2014-09-10 DIAGNOSIS — M8580 Other specified disorders of bone density and structure, unspecified site: Secondary | ICD-10-CM | POA: Insufficient documentation

## 2014-09-30 ENCOUNTER — Encounter: Payer: Medicare Other | Admitting: Family Medicine

## 2014-10-15 ENCOUNTER — Telehealth: Payer: Self-pay | Admitting: Internal Medicine

## 2014-10-15 NOTE — Telephone Encounter (Signed)
Patient states she had a stool with bright, red blood and clots this AM. Reports she alternates constipation and diarrhea. Denies abdominal pain just has lots to gas. Scheduled with Dr. Havery Moros tomorrow at 10:15 AM.

## 2014-10-16 ENCOUNTER — Telehealth: Payer: Self-pay | Admitting: Gastroenterology

## 2014-10-16 ENCOUNTER — Ambulatory Visit: Payer: Medicare Other | Admitting: Gastroenterology

## 2014-11-20 ENCOUNTER — Other Ambulatory Visit: Payer: Self-pay | Admitting: Family Medicine

## 2014-11-20 NOTE — Telephone Encounter (Signed)
Med denied, per June phone note pt stated she was finding a new provider.

## 2015-06-17 ENCOUNTER — Other Ambulatory Visit: Payer: Self-pay | Admitting: Family Medicine

## 2015-06-17 DIAGNOSIS — M8580 Other specified disorders of bone density and structure, unspecified site: Secondary | ICD-10-CM

## 2015-06-17 DIAGNOSIS — K580 Irritable bowel syndrome with diarrhea: Secondary | ICD-10-CM | POA: Insufficient documentation

## 2015-06-18 LAB — HM HEPATITIS C SCREENING LAB: HM Hepatitis Screen: NEGATIVE

## 2015-08-05 ENCOUNTER — Encounter: Payer: Medicare Other | Admitting: Family Medicine

## 2015-12-22 DIAGNOSIS — Z974 Presence of external hearing-aid: Secondary | ICD-10-CM | POA: Insufficient documentation

## 2016-12-22 ENCOUNTER — Encounter: Payer: Self-pay | Admitting: *Deleted

## 2016-12-22 ENCOUNTER — Encounter: Payer: Self-pay | Admitting: Family Medicine

## 2016-12-23 ENCOUNTER — Encounter: Payer: Self-pay | Admitting: Family Medicine

## 2016-12-23 ENCOUNTER — Ambulatory Visit (INDEPENDENT_AMBULATORY_CARE_PROVIDER_SITE_OTHER): Payer: Medicare Other | Admitting: Family Medicine

## 2016-12-23 VITALS — BP 128/80 | HR 60 | Temp 98.4°F | Ht 63.0 in | Wt 169.5 lb

## 2016-12-23 DIAGNOSIS — Z1231 Encounter for screening mammogram for malignant neoplasm of breast: Secondary | ICD-10-CM

## 2016-12-23 DIAGNOSIS — E782 Mixed hyperlipidemia: Secondary | ICD-10-CM | POA: Diagnosis not present

## 2016-12-23 DIAGNOSIS — F430 Acute stress reaction: Secondary | ICD-10-CM | POA: Diagnosis not present

## 2016-12-23 DIAGNOSIS — I10 Essential (primary) hypertension: Secondary | ICD-10-CM

## 2016-12-23 DIAGNOSIS — Z1239 Encounter for other screening for malignant neoplasm of breast: Secondary | ICD-10-CM

## 2016-12-23 MED ORDER — ZOLPIDEM TARTRATE 10 MG PO TABS: 10.0000 mg | ORAL_TABLET | Freq: Every evening | ORAL | 0 refills | Status: DC | PRN

## 2016-12-23 NOTE — Progress Notes (Signed)
Subjective  CC:  Chief Complaint  Patient presents with  . Hypertension  . Hyperlipidemia  . Hypothyroidism    HPI: Alexis Banks is a 71 y.o. female who presents to the office today to address the problems listed above in the chief complaint.  Hypertension f/u: Control is good . Pt reports she is doing well. taking medications as instructed, no medication side effects noted, no TIAs, no chest pain on exertion, no dyspnea on exertion, no swelling of ankles. She denies adverse effects from his BP medications. Compliance with medication is good.   Lipids are well controlled on statin. Reviewed most recent labs from may 2018 via care everywhere. Compliant with statin. No myalgias or AEs  Hypothyroidism is well controlled: no sxs of low or high thyroid.   Mood: stressed due to husbands recent dx of ? Lymphoma or lung cancer. Has appt with oncology next week. Husband will not discuss with pt but is planning for end of life w/o her. She is sad and worried. Has been married to husband since age 23!! Has 3 children.  Depression screen [facility] 2/9 12/23/2016 07/02/2013 08/09/2011  Decreased Interest 0 0 0  Down, Depressed, Hopeless 0 0 0  PHQ - 2 Score 0 0 0  Altered sleeping 2 - -  Tired, decreased energy 0 - -  Change in appetite 0 - -  Feeling bad or failure about yourself  0 - -  Trouble concentrating 0 - -  Moving slowly or fidgety/restless 0 - -  Suicidal thoughts 0 - -  PHQ-9 Score 2 - -  Difficult doing work/chores Not difficult at all - -   GAD 7 : Generalized Anxiety Score 12/23/2016  Nervous, Anxious, on Edge 0  Control/stop worrying 3  Worry too much - different things 0  Trouble relaxing 0  Restless 0  Easily annoyed or irritable 0  Afraid - awful might happen 0  Total GAD 7 Score 3  Anxiety Difficulty Somewhat difficult      BP Readings from Last 3 Encounters:  12/23/16 128/80  01/31/14 140/82  01/22/14 164/86   Wt Readings from Last 3 Encounters:  12/23/16 169 lb 8  oz (76.9 kg)  01/31/14 177 lb 6.4 oz (80.5 kg)  01/21/14 178 lb (80.7 kg)    I reviewed the patients updated PMH, FH, and SocHx.    Patient Active Problem List   Diagnosis Date Noted  . Obesity (BMI 30-39.9) 11/10/2012    Priority: High  . Hypothyroidism 01/09/2007    Priority: High  . Hyperlipidemia 01/09/2007    Priority: High  . Essential hypertension 01/09/2007    Priority: High  . Irritable bowel syndrome with diarrhea 06/17/2015    Priority: Medium  . Osteopenia 09/10/2014    Priority: Medium  . OAB (overactive bladder) 02/24/2012    Priority: Medium  . GERD 03/27/2009    Priority: Medium  . Primary insomnia 03/02/2007    Priority: Medium  . Uses hearing aid 12/22/2015    Priority: Low    Allergies: Adhesive [tape]  Social History: Patient  reports that  has never smoked. she has never used smokeless tobacco. She reports that she does not drink alcohol or use drugs..  Current Outpatient Medications on File Prior to Visit  Medication Sig Dispense Refill  . levothyroxine (SYNTHROID, LEVOTHROID) 112 MCG tablet Take 112 mcg by mouth daily before breakfast.    . aspirin 81 MG tablet Take 81 mg by mouth daily.      Marland Kitchen  benazepril (LOTENSIN) 20 MG tablet Take 1 tablet by mouth  daily 90 tablet 1  . Calcium Carb-Cholecalciferol (CALCIUM-VITAMIN D3) 600-500 MG-UNIT CAPS Take 1 capsule by mouth daily.    . Iron Combinations (IRON COMPLEX PO) Take 130 mg by mouth every other day.    . lovastatin (MEVACOR) 40 MG tablet Take 1 tablet by mouth at  bedtime 90 tablet 1  . Multiple Vitamin (MULTIVITAMIN) tablet Take 1 tablet by mouth daily.      . nitroGLYCERIN (NITROSTAT) 0.4 MG SL tablet Place 1 tablet (0.4 mg total) under the tongue every 5 (five) minutes as needed for chest pain. 25 tablet 6  . omeprazole (PRILOSEC) 40 MG capsule Take 1 capsule (40 mg total) by mouth daily. 90 capsule 3  . propranolol (INDERAL) 20 MG tablet Take 0.5 tablets (10 mg total) by mouth 2 (two) times  daily. 90 tablet 3  . Psyllium (METAMUCIL MULTIHEALTH FIBER PO) Take by mouth.    . [DISCONTINUED] colestipol (COLESTID) 1 G tablet Take 1 g by mouth daily.       No current facility-administered medications on file prior to visit.       Review of Systems: Cardiovascular: negative for chest pain, palpitations, leg swelling, orthopnea Respiratory: negative for SOB, wheezing or persistent cough Gastrointestinal: negative for abdominal pain Genitourinary: negative for dysuria or gross hematuria  Objective  Vitals: BP 128/80 (BP Location: Left Arm, Patient Position: Sitting, Cuff Size: Large)   Pulse 60   Temp 98.4 F (36.9 C) (Oral)   Ht 5\' 3"  (1.6 m)   Wt 169 lb 8 oz (76.9 kg)   SpO2 95%   BMI 30.03 kg/m  General: no acute distress  Psych:  Alert and oriented, normal mood and affect HEENT:  Normocephalic, atraumatic, supple neck  Cardiovascular:  RRR without murmur. no edema Respiratory:  Good breath sounds bilaterally, CTAB with normal respiratory effort Skin:  Warm, no rashes Neurologic:   Mental status is normal  Assessment  1. Stress reaction   2. Essential hypertension   3. Mixed hyperlipidemia   4. Breast cancer screening      Plan   Stress reaction: counseling done. Discussed strategies to help support her husband and help with communication in the home. Refilled sleep meds during stressful time.   Hypertension f/u: BP control is well controlled. This medical condition is well controlled. There are no signs of complications, medication side effects, or red flags. Patient is instructed to continue the current treatment plan without change in therapies or medications.   Thyroid is stable.   Hyperlipidemia f/u: This medical condition is well controlled. There are no signs of complications, medication side effects, or red flags. Patient is instructed to continue the current treatment plan without change in therapies or medications.   Schedule mammogram.    Education regarding management of these chronic disease states was given. Management strategies discussed on successive visits include dietary and exercise recommendations, goals of achieving and maintaining IBW, and lifestyle modifications aiming for adequate sleep and minimizing stressors.   Follow up: f/u in may for cpe; awv after 01/2017   Commons side effects, risks, benefits, and alternatives for medications and treatment plan prescribed today were discussed, and the patient expressed understanding of the given instructions. Patient is instructed to call or message via MyChart if he/she has any questions or concerns regarding our treatment plan. No barriers to understanding were identified. We discussed Red Flag symptoms and signs in detail. Patient expressed understanding regarding what to  do in case of urgent or emergency type symptoms.   Medication list was reconciled, printed and provided to the patient in AVS. Patient instructions and summarycou information was reviewed with the patient as documented in the AVS. This note was prepared with assistance of Dragon voice recognition software. Occasional wrong-word or sound-a-like substitutions may have occurred due to the inherent limitations of voice recognition software  Orders Placed This Encounter  Procedures  . MM DIGITAL SCREENING BILATERAL   Meds ordered this encounter  Medications  . DISCONTD: zolpidem (AMBIEN) 10 MG tablet    Sig: Take 1 tablet (10 mg total) by mouth at bedtime as needed for sleep.    Dispense:  30 tablet    Refill:  0  . zolpidem (AMBIEN) 10 MG tablet    Sig: Take 1 tablet (10 mg total) by mouth at bedtime as needed for sleep.    Dispense:  30 tablet    Refill:  0

## 2016-12-23 NOTE — Patient Instructions (Addendum)
It was so good seeing you again! Thank you for establishing with my new practice and allowing me to continue caring for you. It means a lot to me.   Please schedule a follow up appointment with me in May 2019 for your complete physical. Please sign a release of records form so I can request a copy of our last bone density test from [facility] (May 2017).  Medicare recommends an Annual Wellness Visit for all patients. Please schedule this to be done with our Nurse Educator, Maudie Mercury. This is an informative "talk" visit; it's goals are to ensure that your health care needs are being met and to give you education regarding avoiding falls, ensuring you are not suffering from depression or problems with memory or thinking, and to educate you on Advance Care Planning. It helps me take good care of you!

## 2017-01-07 ENCOUNTER — Telehealth: Payer: Self-pay | Admitting: Family Medicine

## 2017-01-07 NOTE — Telephone Encounter (Signed)
Hard to say; can try stretching before going to bed, ensure is keeping hydrated and may drink tonic water. Recommend office visit to further evaluate if not improving

## 2017-01-07 NOTE — Telephone Encounter (Signed)
Copied from [place] [phone]. Topic: Quick Communication - See Telephone Encounter >> Jan 07, 2017 12:26 PM Aurelio Brash B wrote: CRM for notification. See Telephone encounter for:  Pt is asking for advice on what to take OTC for cramps in her thighs mostly in right thigh 01/07/17.

## 2017-01-07 NOTE — Telephone Encounter (Signed)
Spoke to pt, told her Dr. Jonni Sanger said you can try stretching before going to bed, ensure is keeping hydrated and may drink tonic water. Recommend office visit to further evaluate if not improving. Pt verbalized understanding.

## 2017-01-31 ENCOUNTER — Telehealth: Payer: Self-pay | Admitting: Family Medicine

## 2017-01-31 DIAGNOSIS — G5760 Lesion of plantar nerve, unspecified lower limb: Secondary | ICD-10-CM

## 2017-01-31 NOTE — Telephone Encounter (Signed)
Copied from [place] [id number]. Topic: Referral - Request >> Jan 31, 2017  9:35 AM Neva Seat wrote:   [facility] on [address] - [phone]

## 2017-01-31 NOTE — Telephone Encounter (Signed)
Unsure as to why this was routed to [place], no referral in system. Also, patient is not [place] patient.

## 2017-02-01 NOTE — Addendum Note (Signed)
Addended by: Billey Chang on: 02/01/2017 12:44 PM   Modules accepted: Orders

## 2017-02-01 NOTE — Telephone Encounter (Signed)
Patient is aware that referral has been placed. 

## 2017-02-01 NOTE — Telephone Encounter (Signed)
Referral placed. Please notify pt.

## 2017-02-01 NOTE — Telephone Encounter (Signed)
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  This has been a problem for her in the past and she would like to see [place] as noted below.   Insurance requires that a referral be placed before she can go.    Routed to PCP.

## 2017-02-11 ENCOUNTER — Ambulatory Visit (INDEPENDENT_AMBULATORY_CARE_PROVIDER_SITE_OTHER): Payer: Medicare Other

## 2017-02-11 ENCOUNTER — Ambulatory Visit: Payer: Medicare Other | Admitting: Podiatry

## 2017-02-11 ENCOUNTER — Other Ambulatory Visit: Payer: Self-pay | Admitting: Podiatry

## 2017-02-11 DIAGNOSIS — M722 Plantar fascial fibromatosis: Secondary | ICD-10-CM

## 2017-02-11 DIAGNOSIS — M79672 Pain in left foot: Secondary | ICD-10-CM

## 2017-02-11 DIAGNOSIS — M79671 Pain in right foot: Secondary | ICD-10-CM

## 2017-02-11 DIAGNOSIS — D3610 Benign neoplasm of peripheral nerves and autonomic nervous system, unspecified: Secondary | ICD-10-CM

## 2017-02-11 NOTE — Patient Instructions (Addendum)
Plantar Fasciitis (Heel Spur Syndrome) with Rehab The plantar fascia is a fibrous, ligament-like, soft-tissue structure that spans the bottom of the foot. Plantar fasciitis is a condition that causes pain in the foot due to inflammation of the tissue. SYMPTOMS   Pain and tenderness on the underneath side of the foot.  Pain that worsens with standing or walking. CAUSES  Plantar fasciitis is caused by irritation and injury to the plantar fascia on the underneath side of the foot. Common mechanisms of injury include:  Direct trauma to bottom of the foot.  Damage to a small nerve that runs under the foot where the main fascia attaches to the heel bone.  Stress placed on the plantar fascia due to bone spurs. RISK INCREASES WITH:   Activities that place stress on the plantar fascia (running, jumping, pivoting, or cutting).  Poor strength and flexibility.  Improperly fitted shoes.  Tight calf muscles.  Flat feet.  Failure to warm-up properly before activity.  Obesity. PREVENTION  Warm up and stretch properly before activity.  Allow for adequate recovery between workouts.  Maintain physical fitness:  Strength, flexibility, and endurance.  Cardiovascular fitness.  Maintain a health body weight.  Avoid stress on the plantar fascia.  Wear properly fitted shoes, including arch supports for individuals who have flat feet.  PROGNOSIS  If treated properly, then the symptoms of plantar fasciitis usually resolve without surgery. However, occasionally surgery is necessary.  RELATED COMPLICATIONS   Recurrent symptoms that may result in a chronic condition.  Problems of the lower back that are caused by compensating for the injury, such as limping.  Pain or weakness of the foot during push-off following surgery.  Chronic inflammation, scarring, and partial or complete fascia tear, occurring more often from repeated injections.  TREATMENT  Treatment initially involves the  use of ice and medication to help reduce pain and inflammation. The use of strengthening and stretching exercises may help reduce pain with activity, especially stretches of the Achilles tendon. These exercises may be performed at home or with a therapist. Your caregiver may recommend that you use heel cups of arch supports to help reduce stress on the plantar fascia. Occasionally, corticosteroid injections are given to reduce inflammation. If symptoms persist for greater than 6 months despite non-surgical (conservative), then surgery may be recommended.   MEDICATION   If pain medication is necessary, then nonsteroidal anti-inflammatory medications, such as aspirin and ibuprofen, or other minor pain relievers, such as acetaminophen, are often recommended.  Do not take pain medication within 7 days before surgery.  Prescription pain relievers may be given if deemed necessary by your caregiver. Use only as directed and only as much as you need.  Corticosteroid injections may be given by your caregiver. These injections should be reserved for the most serious cases, because they may only be given a certain number of times.  HEAT AND COLD  Cold treatment (icing) relieves pain and reduces inflammation. Cold treatment should be applied for 10 to 15 minutes every 2 to 3 hours for inflammation and pain and immediately after any activity that aggravates your symptoms. Use ice packs or massage the area with a piece of ice (ice massage).  Heat treatment may be used prior to performing the stretching and strengthening activities prescribed by your caregiver, physical therapist, or athletic trainer. Use a heat pack or soak the injury in warm water.  SEEK IMMEDIATE MEDICAL CARE IF:  Treatment seems to offer no benefit, or the condition worsens.  Any medications   produce adverse side effects.  EXERCISES- RANGE OF MOTION (ROM) AND STRETCHING EXERCISES - Plantar Fasciitis (Heel Spur Syndrome) These exercises  may help you when beginning to rehabilitate your injury. Your symptoms may resolve with or without further involvement from your physician, physical therapist or athletic trainer. While completing these exercises, remember:   Restoring tissue flexibility helps normal motion to return to the joints. This allows healthier, less painful movement and activity.  An effective stretch should be held for at least 30 seconds.  A stretch should never be painful. You should only feel a gentle lengthening or release in the stretched tissue.  RANGE OF MOTION - Toe Extension, Flexion  Sit with your right / left leg crossed over your opposite knee.  Grasp your toes and gently pull them back toward the top of your foot. You should feel a stretch on the bottom of your toes and/or foot.  Hold this stretch for 10 seconds.  Now, gently pull your toes toward the bottom of your foot. You should feel a stretch on the top of your toes and or foot.  Hold this stretch for 10 seconds. Repeat  times. Complete this stretch 3 times per day.   RANGE OF MOTION - Ankle Dorsiflexion, Active Assisted  Remove shoes and sit on a chair that is preferably not on a carpeted surface.  Place right / left foot under knee. Extend your opposite leg for support.  Keeping your heel down, slide your right / left foot back toward the chair until you feel a stretch at your ankle or calf. If you do not feel a stretch, slide your bottom forward to the edge of the chair, while still keeping your heel down.  Hold this stretch for 10 seconds. Repeat 3 times. Complete this stretch 2 times per day.   STRETCH  Gastroc, Standing  Place hands on wall.  Extend right / left leg, keeping the front knee somewhat bent.  Slightly point your toes inward on your back foot.  Keeping your right / left heel on the floor and your knee straight, shift your weight toward the wall, not allowing your back to arch.  You should feel a gentle stretch  in the right / left calf. Hold this position for 10 seconds. Repeat 3 times. Complete this stretch 2 times per day.  STRETCH  Soleus, Standing  Place hands on wall.  Extend right / left leg, keeping the other knee somewhat bent.  Slightly point your toes inward on your back foot.  Keep your right / left heel on the floor, bend your back knee, and slightly shift your weight over the back leg so that you feel a gentle stretch deep in your back calf.  Hold this position for 10 seconds. Repeat 3 times. Complete this stretch 2 times per day.  STRETCH  Gastrocsoleus, Standing  Note: This exercise can place a lot of stress on your foot and ankle. Please complete this exercise only if specifically instructed by your caregiver.   Place the ball of your right / left foot on a step, keeping your other foot firmly on the same step.  Hold on to the wall or a rail for balance.  Slowly lift your other foot, allowing your body weight to press your heel down over the edge of the step.  You should feel a stretch in your right / left calf.  Hold this position for 10 seconds.  Repeat this exercise with a slight bend in your right /   left knee. Repeat 3 times. Complete this stretch 2 times per day.   STRENGTHENING EXERCISES - Plantar Fasciitis (Heel Spur Syndrome)  These exercises may help you when beginning to rehabilitate your injury. They may resolve your symptoms with or without further involvement from your physician, physical therapist or athletic trainer. While completing these exercises, remember:   Muscles can gain both the endurance and the strength needed for everyday activities through controlled exercises.  Complete these exercises as instructed by your physician, physical therapist or athletic trainer. Progress the resistance and repetitions only as guided.  STRENGTH - Towel Curls  Sit in a chair positioned on a non-carpeted surface.  Place your foot on a towel, keeping your heel  on the floor.  Pull the towel toward your heel by only curling your toes. Keep your heel on the floor. Repeat 3 times. Complete this exercise 2 times per day.  STRENGTH - Ankle Inversion  Secure one end of a rubber exercise band/tubing to a fixed object (table, pole). Loop the other end around your foot just before your toes.  Place your fists between your knees. This will focus your strengthening at your ankle.  Slowly, pull your big toe up and in, making sure the band/tubing is positioned to resist the entire motion.  Hold this position for 10 seconds.  Have your muscles resist the band/tubing as it slowly pulls your foot back to the starting position. Repeat 3 times. Complete this exercises 2 times per day.  Document Released: 01/04/2005 Document Revised: 03/29/2011 Document Reviewed: 04/18/2008 [facility] Patient Information 2014 [place], [place].Morton Neuralgia Morton neuralgia is a type of foot pain in the area closest to your toes. This area is sometimes called the ball of your foot. Morton neuralgia occurs when a branch of a nerve in your foot (digital nerve) becomes compressed. When this happens over a long period of time, the nerve can thicken (neuroma) and cause pain. This usually occurs between the third and fourth toe. Morton neuralgia can come and go but may get worse over time. What are the causes? Your digital nerve can become compressed and stretched at a point where it passes under a thick band of tissue that connects your toes (intermetatarsal ligament). Morton neuralgia can be caused by mild repetitive damage in this area. This type of damage can result from:  Activities such as running or jumping.  Wearing shoes that are too tight.  What increases the risk? You may be at risk for Morton neuralgia if you:  Are female.  Wear high heels.  Wear shoes that are narrow or tight.  Participate in activities that stretch your toes. These  include: ? Running. ? Forest. ? Long-distance walking.  What are the signs or symptoms? The first symptom of Morton neuralgia is pain that spreads from the ball of your foot to your toes. It may feel like you are walking on a marble. Pain usually gets worse with walking and goes away at night. Other symptoms may include numbness and cramping of your toes. How is this diagnosed? Your health care provider will do a physical exam. When doing the exam, your health care provider may:  Squeeze your foot just behind your toe.  Ask you to move your toes to check for pain.  You may also have tests on your foot to confirm the diagnosis. These may include:  An X-ray.  An MRI.  How is this treated? Treatment for Morton neuralgia may be as simple as changing the kind of  shoes you wear. Other treatments may include:  Wearing a supportive pad (orthosis) under the front of your foot. This lifts your toe bones and takes pressure off the nerve.  Getting injections of numbing medicine and anti-inflammatory medicine (steroid) in the nerve.  Having surgery to remove part of the thickened nerve.  Follow these instructions at home:  Take medicine only as directed by your health care provider.  Wear soft-soled shoes with a wide toe area.  Stop activities that may be causing pain.  Elevate your foot when resting.  Massage your foot.  Apply ice to the injured area: ? Put ice in a plastic bag. ? Place a towel between your skin and the bag. ? Leave the ice on for 20 minutes, 2-3 times a day.  Keep all follow-up visits as directed by your health care provider. This is important. Contact a health care provider if:  Home care instructions are not helping you get better.  Your symptoms change or get worse. This information is not intended to replace advice given to you by your health care provider. Make sure you discuss any questions you have with your health care provider. Document Released:  04/12/2000 Document Revised: 06/12/2015 Document Reviewed: 03/07/2013 Elsevier Interactive Patient Education  2018 [place] [facility] [place] Lathe neuralgia is a type of foot pain in the area closest to your toes. This area is sometimes called the ball of your foot. Morton neuralgia occurs when a branch of a nerve in your foot (digital nerve) becomes compressed. When this happens over a long period of time, the nerve can thicken (neuroma) and cause pain. This usually occurs between the third and fourth toe. Morton neuralgia can come and go but may get worse over time. What are the causes? Your digital nerve can become compressed and stretched at a point where it passes under a thick band of tissue that connects your toes (intermetatarsal ligament). Morton neuralgia can be caused by mild repetitive damage in this area. This type of damage can result from: Activities such as running or jumping. Wearing shoes that are too tight.  What increases the risk? You may be at risk for Morton neuralgia if you: Are female. Wear high heels. Wear shoes that are narrow or tight. Participate in activities that stretch your toes. These include: Running. Ballet. Long-distance walking.  What are the signs or symptoms? The first symptom of Morton neuralgia is pain that spreads from the ball of your foot to your toes. It may feel like you are walking on a marble. Pain usually gets worse with walking and goes away at night. Other symptoms may include numbness and cramping of your toes. How is this diagnosed? Your health care provider will do a physical exam. When doing the exam, your health care provider may: Squeeze your foot just behind your toe. Ask you to move your toes to check for pain.  You may also have tests on your foot to confirm the diagnosis. These may include: An X-ray. An MRI.  How is this treated? Treatment for Morton neuralgia may be as simple as changing the kind of shoes you  wear. Other treatments may include: Wearing a supportive pad (orthosis) under the front of your foot. This lifts your toe bones and takes pressure off the nerve. Getting injections of numbing medicine and anti-inflammatory medicine (steroid) in the nerve. Having surgery to remove part of the thickened nerve.  Follow these instructions at home: Take medicine only as directed by your health  care provider. Wear soft-soled shoes with a wide toe area. Stop activities that may be causing pain. Elevate your foot when resting. Massage your foot. Apply ice to the injured area: Put ice in a plastic bag. Place a towel between your skin and the bag. Leave the ice on for 20 minutes, 2-3 times a day. Keep all follow-up visits as directed by your health care provider. This is important. Contact a health care provider if: Home care instructions are not helping you get better. Your symptoms change or get worse. This information is not intended to replace advice given to you by your health care provider. Make sure you discuss any questions you have with your health care provider. Document Released: 04/12/2000 Document Revised: 06/12/2015 Document Reviewed: 03/07/2013 Elsevier Interactive Patient Education  [employer].

## 2017-02-11 NOTE — Progress Notes (Signed)
Subjective:   Patient ID: Alexis Banks, female   DOB: 72 y.o.   MRN: [id number]   HPI Patient presents stating she has had exquisite pain in the right over left forefoot when she wears shoes.  States that it burns and radiates and it seems to come between the third and fourth toes with mild separation of the toes and states mostly she does not have pain when she is nonweightbearing except when she first gets up in the morning.  Patient does not smoke and likes to be active   Review of Systems  All other systems reviewed and are negative.       Objective:  Physical Exam  Constitutional: She appears well-developed and well-nourished.  Cardiovascular: Intact distal pulses.  Pulmonary/Chest: Effort normal.  Musculoskeletal: Normal range of motion.  Neurological: She is alert.  Skin: Skin is warm.  Nursing note and vitals reviewed.   Neurovascular status found to be intact muscle strength was adequate range of motion within normal limits with exquisite discomfort third interspace right over left with positive Mulder sign and mass formation.  It is very tender when pressed and I was unable to elicit pain in the metatarsal phalangeal joint     Assessment:  Strong probability for neuroma-like symptoms versus capsulitis right over left foot     Plan:  H&P condition reviewed at great length and at this point careful injection administered third interspace with a neural lysis purified alcohol Marcaine solution and we will see results and I did discuss if symptoms go away short-term but come back again very quickly surgical intervention may be her best option  X-rays indicate that there is no indications of stress fracture arthritis or any type of advanced process

## 2017-02-25 ENCOUNTER — Ambulatory Visit: Payer: Medicare Other | Admitting: Podiatry

## 2017-02-25 ENCOUNTER — Encounter: Payer: Self-pay | Admitting: Podiatry

## 2017-02-25 DIAGNOSIS — D3610 Benign neoplasm of peripheral nerves and autonomic nervous system, unspecified: Secondary | ICD-10-CM | POA: Diagnosis not present

## 2017-02-25 NOTE — Progress Notes (Signed)
Subjective:   Patient ID: Alexis Banks, female   DOB: 72 y.o.   MRN: [id number]   HPI Patient presents with continued exquisite discomfort third interspace right with shoe gear and states that after adequate the injection she had complete relief for 6-8 hours followed by recurrence of pain and states the left hurts but not to the same degree   ROS      Objective:  Physical Exam  Neurovascular status intact with patient noted to have a palpable mass third interspace right over left with pain with palpation.  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  Due to long-standing nature and only small improvement with injection she denies wanting more injections and would rather have surgery on the right one.  I allow her to read consent form going over alternative treatments and complications that are associated with surgery and patient understands all of this and signed consent form.  Understands no guarantee that this will solve the problem and a total recovery can take approximately 6 months and patient signed consent form is given all preoperative instructions.  Scheduled for outpatient surgery to arrange for especially surgical center and encouraged to call with any questions prior to procedure

## 2017-02-25 NOTE — Patient Instructions (Signed)
Pre-Operative Instructions  Congratulations, you have decided to take an important step towards improving your quality of life.  You can be assured that the doctors and staff at [facility] will be with you every step of the way.  Here are some important things you should know:  1. Plan to be at the surgery center/hospital at least 1 (one) hour prior to your scheduled time, unless otherwise directed by the surgical center/hospital staff.  You must have a responsible adult accompany you, remain during the surgery and drive you home.  Make sure you have directions to the surgical center/hospital to ensure you arrive on time. 2. If you are having surgery at [facility] or [facility] hospitals, you will need a copy of your medical history and physical form from your family physician within one month prior to the date of surgery. We will give you a form for your primary physician to complete.  3. We make every effort to accommodate the date you request for surgery.  However, there are times where surgery dates or times have to be moved.  We will contact you as soon as possible if a change in schedule is required.   4. No aspirin/ibuprofen for one week before surgery.  If you are on aspirin, any non-steroidal anti-inflammatory medications (Mobic, Aleve, Ibuprofen) should not be taken seven (7) days prior to your surgery.  You make take Tylenol for pain prior to surgery.  5. Medications - If you are taking daily heart and blood pressure medications, seizure, reflux, allergy, asthma, anxiety, pain or diabetes medications, make sure you notify the surgery center/hospital before the day of surgery so they can tell you which medications you should take or avoid the day of surgery. 6. No food or drink after midnight the night before surgery unless directed otherwise by surgical center/hospital staff. 7. No alcoholic beverages 24-hours prior to surgery.  No smoking 24-hours prior or 24-hours after  surgery. 8. Wear loose pants or shorts. They should be loose enough to fit over bandages, boots, and casts. 9. Don't wear slip-on shoes. Sneakers are preferred. 10. Bring your boot with you to the surgery center/hospital.  Also bring crutches or a walker if your physician has prescribed it for you.  If you do not have this equipment, it will be provided for you after surgery. 11. If you have not been contacted by the surgery center/hospital by the day before your surgery, call to confirm the date and time of your surgery. 12. Leave-time from work may vary depending on the type of surgery you have.  Appropriate arrangements should be made prior to surgery with your employer. 13. Prescriptions will be provided immediately following surgery by your doctor.  Fill these as soon as possible after surgery and take the medication as directed. Pain medications will not be refilled on weekends and must be approved by the doctor. 14. Remove nail polish on the operative foot and avoid getting pedicures prior to surgery. 15. Wash the night before surgery.  The night before surgery wash the foot and leg well with water and the antibacterial soap provided. Be sure to pay special attention to beneath the toenails and in between the toes.  Wash for at least three (3) minutes. Rinse thoroughly with water and dry well with a towel.  Perform this wash unless told not to do so by your physician.  Enclosed: 1 Ice pack (please put in freezer the night before surgery)   1 Hibiclens skin cleaner     Pre-op instructions  If you have any questions regarding the instructions, please do not hesitate to call our office.  [place]: [address], [place], [place] [address] -- [phone]  [place]: [address] [place], [place] [address] -- [phone]  [place]: [address]  [place], [place] [address] -- [phone]  [place]: [address], [address], [place],  [address] -- [phone]  Website:  [email or web address] 

## 2017-03-25 ENCOUNTER — Ambulatory Visit (INDEPENDENT_AMBULATORY_CARE_PROVIDER_SITE_OTHER): Payer: Medicare Other | Admitting: Family Medicine

## 2017-03-25 ENCOUNTER — Encounter: Payer: Self-pay | Admitting: Family Medicine

## 2017-03-25 ENCOUNTER — Other Ambulatory Visit: Payer: Self-pay

## 2017-03-25 VITALS — BP 132/80 | HR 62 | Temp 97.7°F | Ht 63.0 in | Wt 172.2 lb

## 2017-03-25 DIAGNOSIS — J069 Acute upper respiratory infection, unspecified: Secondary | ICD-10-CM | POA: Diagnosis not present

## 2017-03-25 MED ORDER — ZOLPIDEM TARTRATE 10 MG PO TABS: 10.0000 mg | ORAL_TABLET | Freq: Every evening | ORAL | 1 refills | Status: DC | PRN

## 2017-03-25 NOTE — Progress Notes (Signed)
Subjective   CC:  Chief Complaint  Patient presents with  . Ear Pain    Right side ear fullness, congestion x 3 days    HPI: Alexis Banks is a 72 y.o. female who presents to the office today to address the problems listed above in the chief complaint.  Patient complains of typical URI symptoms including nasal congestion, mild sore throat, PND needing to clear her throat a lot, mild right ear pressure, and mild malaise.  The symptoms have been present for 2-3 days. she is having foot surgery next week.. She denies high fever or productive cough, shortness of breath or significant GI symptoms.  Over-the-counter cold medicines have been minimally or mildly helpful. I reviewed the patients updated PMH, FH, and SocHx.    Patient Active Problem List   Diagnosis Date Noted  . Obesity (BMI 30-39.9) 11/10/2012    Priority: High  . Hypothyroidism 01/09/2007    Priority: High  . Hyperlipidemia 01/09/2007    Priority: High  . Essential hypertension 01/09/2007    Priority: High  . Irritable bowel syndrome with diarrhea 06/17/2015    Priority: Medium  . Osteopenia 09/10/2014    Priority: Medium  . OAB (overactive bladder) 02/24/2012    Priority: Medium  . GERD 03/27/2009    Priority: Medium  . Primary insomnia 03/02/2007    Priority: Medium  . Uses hearing aid 12/22/2015    Priority: Low   Current Meds  Medication Sig  . aspirin 81 MG tablet Take 81 mg by mouth daily.    . benazepril (LOTENSIN) 20 MG tablet Take 1 tablet by mouth  daily  . Calcium Carb-Cholecalciferol (CALCIUM-VITAMIN D3) 600-500 MG-UNIT CAPS Take 1 capsule by mouth daily.  Marland Kitchen levothyroxine (SYNTHROID, LEVOTHROID) 112 MCG tablet Take 112 mcg by mouth daily before breakfast.  . lovastatin (MEVACOR) 40 MG tablet Take 1 tablet by mouth at  bedtime  . Multiple Vitamin (MULTIVITAMIN) tablet Take 1 tablet by mouth daily.    Marland Kitchen omeprazole (PRILOSEC) 40 MG capsule Take 1 capsule (40 mg total) by mouth daily.  . propranolol  (INDERAL) 20 MG tablet Take 0.5 tablets (10 mg total) by mouth 2 (two) times daily.  . [DISCONTINUED] Iron Combinations (IRON COMPLEX PO) Take 130 mg by mouth every other day.    Review of Systems: Constitutional: Negative for fever malaise or anorexia Cardiovascular: negative for chest pain Respiratory: negative for SOB or pleuritic chest pain Gastrointestinal: negative for abdominal pain  Objective  Vitals: BP 132/80   Pulse 62   Temp 97.7 F (36.5 C)   Ht 5\' 3"  (1.6 m)   Wt 172 lb 3.2 oz (78.1 kg)   BMI 30.50 kg/m  General: no acute respiratory distress  Psych:  Alert and oriented, normal mood and affect HEENT: Normocephalic, nasal congestion present, TMs w/o erythema, OP with erythema w/o exudate, + cervical LAD, supple neck  Cardiovascular:  RRR without murmur or gallop. no peripheral edema Respiratory:  Good breath sounds bilaterally, CTAB with normal respiratory effort Skin:  Warm, no rashes Neurologic:   Mental status is normal. normal gait  Assessment  No diagnosis found.   Plan   URI, viral: discussed dx; no sign or sx of bacterial infection is present. Treat supportively with antihistamines, decongestants, and/or cough meds. See AVS for care instructions.   Follow up: prn- cpe in may    Commons side effects, risks, benefits, and alternatives for medications and treatment plan prescribed today were discussed, and the patient expressed understanding  of the given instructions. Patient is instructed to call or message via MyChart if he/she has any questions or concerns regarding our treatment plan. No barriers to understanding were identified. We discussed Red Flag symptoms and signs in detail. Patient expressed understanding regarding what to do in case of urgent or emergency type symptoms.   Medication list was reconciled, printed and provided to the patient in AVS. Patient instructions and summary information was reviewed with the patient as documented in the  AVS. This note was prepared with assistance of Dragon voice recognition software. Occasional wrong-word or sound-a-like substitutions may have occurred due to the inherent limitations of voice recognition software  No orders of the defined types were placed in this encounter.  Meds ordered this encounter  Medications  . zolpidem (AMBIEN) 10 MG tablet    Sig: Take 1 tablet (10 mg total) by mouth at bedtime as needed for sleep.    Dispense:  30 tablet    Refill:  1

## 2017-03-25 NOTE — Patient Instructions (Signed)
See you in May as scheduled.   Use Mucinex DM twice a day to help with your drainage.    Upper Respiratory Infection, Adult Most upper respiratory infections (URIs) are a viral infection of the air passages leading to the lungs. A URI affects the nose, throat, and upper air passages. The most common type of URI is nasopharyngitis and is typically referred to as "the common cold." URIs run their course and usually go away on their own. Most of the time, a URI does not require medical attention, but sometimes a bacterial infection in the upper airways can follow a viral infection. This is called a secondary infection. Sinus and middle ear infections are common types of secondary upper respiratory infections. Bacterial pneumonia can also complicate a URI. A URI can worsen asthma and chronic obstructive pulmonary disease (COPD). Sometimes, these complications can require emergency medical care and may be life threatening. What are the causes? Almost all URIs are caused by viruses. A virus is a type of germ and can spread from one person to another. What increases the risk? You may be at risk for a URI if:  You smoke.  You have chronic heart or lung disease.  You have a weakened defense (immune) system.  You are very young or very old.  You have nasal allergies or asthma.  You work in crowded or poorly ventilated areas.  You work in health care facilities or schools.  What are the signs or symptoms? Symptoms typically develop 2-3 days after you come in contact with a cold virus. Most viral URIs last 7-10 days. However, viral URIs from the influenza virus (flu virus) can last 14-18 days and are typically more severe. Symptoms may include:  Runny or stuffy (congested) nose.  Sneezing.  Cough.  Sore throat.  Headache.  Fatigue.  Fever.  Loss of appetite.  Pain in your forehead, behind your eyes, and over your cheekbones (sinus pain).  Muscle aches.  How is this  diagnosed? Your health care provider may diagnose a URI by:  Physical exam.  Tests to check that your symptoms are not due to another condition such as: ? Strep throat. ? Sinusitis. ? Pneumonia. ? Asthma.  How is this treated? A URI goes away on its own with time. It cannot be cured with medicines, but medicines may be prescribed or recommended to relieve symptoms. Medicines may help:  Reduce your fever.  Reduce your cough.  Relieve nasal congestion.  Follow these instructions at home:  Take medicines only as directed by your health care provider.  Gargle warm saltwater or take cough drops to comfort your throat as directed by your health care provider.  Use a warm mist humidifier or inhale steam from a shower to increase air moisture. This may make it easier to breathe.  Drink enough fluid to keep your urine clear or pale yellow.  Eat soups and other clear broths and maintain good nutrition.  Rest as needed.  Return to work when your temperature has returned to normal or as your health care provider advises. You may need to stay home longer to avoid infecting others. You can also use a face mask and careful hand washing to prevent spread of the virus.  Increase the usage of your inhaler if you have asthma.  Do not use any tobacco products, including cigarettes, chewing tobacco, or electronic cigarettes. If you need help quitting, ask your health care provider. How is this prevented? The best way to protect yourself from  getting a cold is to practice good hygiene.  Avoid oral or hand contact with people with cold symptoms.  Wash your hands often if contact occurs.  There is no clear evidence that vitamin C, vitamin E, echinacea, or exercise reduces the chance of developing a cold. However, it is always recommended to get plenty of rest, exercise, and practice good nutrition. Contact a health care provider if:  You are getting worse rather than better.  Your symptoms  are not controlled by medicine.  You have chills.  You have worsening shortness of breath.  You have brown or red mucus.  You have yellow or brown nasal discharge.  You have pain in your face, especially when you bend forward.  You have a fever.  You have swollen neck glands.  You have pain while swallowing.  You have white areas in the back of your throat. Get help right away if:  You have severe or persistent: ? Headache. ? Ear pain. ? Sinus pain. ? Chest pain.  You have chronic lung disease and any of the following: ? Wheezing. ? Prolonged cough. ? Coughing up blood. ? A change in your usual mucus.  You have a stiff neck.  You have changes in your: ? Vision. ? Hearing. ? Thinking. ? Mood. This information is not intended to replace advice given to you by your health care provider. Make sure you discuss any questions you have with your health care provider. Document Released: 06/30/2000 Document Revised: 09/07/2015 Document Reviewed: 04/11/2013 Elsevier Interactive Patient Education  [employer].

## 2017-03-28 MED FILL — ZOLPIDEM TARTRATE 10 MG TAB: 10 | 30 days supply | Qty: 30 | Fill #0

## 2017-03-29 ENCOUNTER — Encounter: Payer: Self-pay | Admitting: Podiatry

## 2017-03-29 DIAGNOSIS — G588 Other specified mononeuropathies: Secondary | ICD-10-CM | POA: Diagnosis not present

## 2017-03-29 DIAGNOSIS — E7800 Pure hypercholesterolemia, unspecified: Secondary | ICD-10-CM | POA: Diagnosis not present

## 2017-03-29 DIAGNOSIS — G5761 Lesion of plantar nerve, right lower limb: Secondary | ICD-10-CM | POA: Diagnosis not present

## 2017-03-29 MED FILL — HYDROCODON-APAP 5-325: 5-325 | 2 days supply | Qty: 15 | Fill #0

## 2017-03-30 ENCOUNTER — Telehealth: Payer: Self-pay | Admitting: *Deleted

## 2017-03-30 NOTE — Telephone Encounter (Signed)
Called and spoke with patient after having surgery with Dr Paulla Dolly and patient stated that she did not sleep well laying on her back with the boot on and asked if she could lay on her side and I stated that should be fine and to put a pillow in between her legs to elevate and there is no pain at the moment and I stated to ice about 15 minutes on the hour and there is no fever or chills, no nausea and I stated to call if any concerns or questions. Alexis Banks

## 2017-04-01 ENCOUNTER — Encounter: Payer: Self-pay | Admitting: Podiatry

## 2017-04-06 ENCOUNTER — Ambulatory Visit (INDEPENDENT_AMBULATORY_CARE_PROVIDER_SITE_OTHER): Payer: Medicare Other | Admitting: Podiatry

## 2017-04-06 ENCOUNTER — Encounter: Payer: Self-pay | Admitting: Podiatry

## 2017-04-06 DIAGNOSIS — G5761 Lesion of plantar nerve, right lower limb: Secondary | ICD-10-CM

## 2017-04-06 DIAGNOSIS — D3610 Benign neoplasm of peripheral nerves and autonomic nervous system, unspecified: Secondary | ICD-10-CM

## 2017-04-06 NOTE — Progress Notes (Signed)
Subjective:   Patient ID: Alexis Banks, female   DOB: 72 y.o.   MRN: [id number]   HPI Patient presents stating I am doing real well with surgery with minimal discomfort   ROS      Objective:  Physical Exam  Neurovascular status intact with patient's right foot healing well with wound edges well coapted and no drainage noted and no discomfort     Assessment:  Doing well post neurectomy third interspace right     Plan:  Advised on gradual getting the foot wet continue sterile dressing usage and surgical shoe and patient will gradual return to soft shoe gear in the next 3-4 weeks and be seen back

## 2017-04-20 ENCOUNTER — Telehealth: Payer: Self-pay | Admitting: Podiatry

## 2017-04-20 ENCOUNTER — Telehealth: Payer: Self-pay | Admitting: *Deleted

## 2017-04-20 NOTE — Telephone Encounter (Signed)
Pt states she still has stitches from 03/29/2017 surgery in the top of her foot and burning. I told pt that on occasion the dissolvable sutures get dry right at the end of the suture line where the knot is and can cause irritation. I told pt to clean the area daily and then cover with a light coating of neosporin and a bandaid. Pt states she is using a clean white sock to cover the neosporin. I told her that would be fine and transferred to schedulers.

## 2017-04-20 NOTE — Telephone Encounter (Signed)
Dr. Paulla Dolly did surgery on my foot on 12 March. I was wondering, am I still supposed to have sutures? Also, there is one little pussy pocket about the size of a little green pea. I wanted to know if that is normal? I need you to call me back please at [phone]. There is no pain, I just still feel stitches and there is that one open spot. Thank you.

## 2017-04-21 ENCOUNTER — Encounter: Payer: Self-pay | Admitting: Podiatry

## 2017-04-21 ENCOUNTER — Ambulatory Visit (INDEPENDENT_AMBULATORY_CARE_PROVIDER_SITE_OTHER): Payer: Self-pay | Admitting: Podiatry

## 2017-04-21 DIAGNOSIS — L02619 Cutaneous abscess of unspecified foot: Secondary | ICD-10-CM

## 2017-04-21 DIAGNOSIS — L03119 Cellulitis of unspecified part of limb: Secondary | ICD-10-CM

## 2017-04-21 MED ORDER — CEPHALEXIN 500 MG PO CAPS
500.0000 mg | ORAL_CAPSULE | Freq: Two times a day (BID) | ORAL | 0 refills | Status: DC
Start: 2017-04-21 — End: 2017-05-26

## 2017-04-21 MED FILL — CEPHALEXIN 500 MG CAPSULE: 500 | 10 days supply | Qty: 20 | Fill #0

## 2017-04-21 NOTE — Progress Notes (Signed)
Subjective:   Patient ID: Alexis Banks, female   DOB: 72 y.o.   MRN: [id number]   HPI Patient presents with redness in the right foot and was just concerned because we have done surgery on this about a month ago   ROS      Objective:  Physical Exam  Neurovascular status intact negative Homans sign noted with patient's third interspace having incision that is healing well with slight redness with no active drainage noted.  No proximal edema erythema or drainage noted     Assessment:  Possible localized cellulitic event versus just irritation secondary to activity and suture     Plan:  H&P reviewed condition with patient.  I instructed on Epson salt soaks and Band-Aid with Neosporin and I did go ahead today and as precautionary measure placed on cephalexin 500 mg twice daily and gave strict instructions of any increase in redness any proximal edema erythema or any systemic signs of infection were to occur to contact us immediately

## 2017-04-27 ENCOUNTER — Ambulatory Visit: Payer: Medicare Other

## 2017-04-27 ENCOUNTER — Other Ambulatory Visit: Payer: Self-pay | Admitting: Family Medicine

## 2017-04-27 DIAGNOSIS — Z1231 Encounter for screening mammogram for malignant neoplasm of breast: Secondary | ICD-10-CM

## 2017-04-28 ENCOUNTER — Ambulatory Visit
Admission: RE | Admit: 2017-04-28 | Discharge: 2017-04-28 | Disposition: A | Discharge status: Home / Self Care | Payer: Medicare Other | Source: Ambulatory Visit | Attending: Family Medicine | Admitting: Family Medicine

## 2017-04-28 DIAGNOSIS — Z1231 Encounter for screening mammogram for malignant neoplasm of breast: Secondary | ICD-10-CM | POA: Diagnosis not present

## 2017-04-29 ENCOUNTER — Encounter: Payer: Self-pay | Admitting: Emergency Medicine

## 2017-04-29 ENCOUNTER — Telehealth: Payer: Self-pay | Admitting: Family Medicine

## 2017-04-29 ENCOUNTER — Telehealth: Payer: Self-pay | Admitting: Podiatry

## 2017-04-29 MED ORDER — FLUCONAZOLE 100 MG PO TABS: 100.0000 mg | ORAL_TABLET | Freq: Every day | ORAL | 1 refills | Status: DC

## 2017-04-29 NOTE — Addendum Note (Signed)
Addended by: Harriett Sine D on: 04/29/2017 01:43 PM   Modules accepted: Orders

## 2017-04-29 NOTE — Telephone Encounter (Signed)
Alexis Banks, I had surgery with Dr. Paulla Dolly on 12 March. I got an infection and he gave me keflex, 1000 mg a day which is 500 mg in the am and 500 mg in the pm. It has given me a yeast infection and I wanted to know if you or Dr. Paulla Dolly could call something in for that. I called my medical doctor and she said to contact you guys because you were the one that gave it to me. My number is [phone]. Thank you.

## 2017-04-29 NOTE — Telephone Encounter (Signed)
Copied from [place] [id number]. Topic: Quick Communication - Rx Refill/Question >> Apr 29, 2017  9:34 AM Neva Seat wrote: Pt had foot surgery and the foot go infected.  She was prescribed Cephalexin 500 mg by Dr. Paulla Dolly. She now has a yeast infection.  She is asking if the yeast infection pill could be prescribed to her. She said she has tried over the counter medication that didn't work. Please call pt back to let her know if this can be called in for her ASAP.

## 2017-04-29 NOTE — Telephone Encounter (Signed)
Pt states she has a yeast infection and has tried the OTC medications without results. Dr. Paulla Dolly ordered diflucan 100mg  #1 +1 refill. Orders to the [facility].

## 2017-04-29 NOTE — Telephone Encounter (Signed)
Patient aware that PCP is not in office today.  She is going to call the physician that has her on the abx to see if they will call in something for the yeast as well.

## 2017-05-05 MED FILL — ZOLPIDEM TARTRATE 10 MG TAB: 10 | 30 days supply | Qty: 30 | Fill #1

## 2017-05-18 ENCOUNTER — Ambulatory Visit: Payer: Medicare Other

## 2017-05-18 ENCOUNTER — Encounter: Payer: Medicare Other | Admitting: Family Medicine

## 2017-05-25 NOTE — Progress Notes (Signed)
Subjective:   Alexis Banks is a 72 y.o. female who presents for Medicare Annual (Subsequent) preventive examination.  Review of Systems:  No ROS.  Medicare Wellness Visit. Additional risk factors are reflected in the social history.  Cardiac Risk Factors include: advanced age (>11men, >49 women);hypertension;dyslipidemia;obesity (BMI >30kg/m2);family history of premature cardiovascular disease   Sleep patterns: Sleeps 6 hours.  Home Safety/Smoke Alarms: Feels safe in home. Smoke alarms in place.  Living environment; residence and Firearm Safety: Lives with husband in 1 story home. Steps at door with rail.  Seat Belt Safety/Bike Helmet: Wears seat belt.   Female:   Pap-N/A       Mammo-04/28/2017, BI-RADS CATEGORY  1: Negative       Dexa scan-04/15/2009, pt reports normal. Scheduled in July 2019.       CCS-Colonoscopy 09/19/2013, normal. Recall 10 years.       Objective:     Vitals: BP 110/82 Comment: VS obtained by [name], LPN  Pulse 63   Ht 5\' 3"  (1.6 m)   Wt 173 lb 3.2 oz (78.6 kg)   BMI 30.68 kg/m   Body mass index is 30.68 kg/m.  Advanced Directives 05/26/2017 01/21/2014 09/19/2013 12/25/2011  Does Patient Have a Medical Advance Directive? Yes No No Patient does not have advance directive;Patient would not like information  Type of Advance Directive Living will;Healthcare Power of Attorney - - -  [place] in Chart? No - copy requested - - -  Pre-existing out of facility DNR order (yellow form or pink MOST form) - - - No    Tobacco Social History   Tobacco Use  Smoking Status Never Smoker  Smokeless Tobacco Never Used     Counseling given: Not Answered    Past Medical History:  Diagnosis Date  . Anemia   . ANXIETY DEPRESSION   . Common bile duct dilation   . CORONARY ARTERY DISEASE   . GERD   . Hyperlipidemia   . Hypertension   . HYPOTHYROIDISM   . INSOMNIA   . ROTATOR CUFF SYNDROME, RIGHT   . Trochanteric bursitis of left  hip   . VARICOSE VEINS, LOWER EXTREMITIES   . VITAMIN D DEFICIENCY    Past Surgical History:  Procedure Laterality Date  . CHOLECYSTECTOMY    . Rotator cuff surgery     Family History  Problem Relation Age of Onset  . COPD Father   . Alcohol abuse Mother   . Alzheimer's disease Sister   . Heart disease Neg Hx    Social History   Socioeconomic History  . Marital status: Married    Spouse name: Not on file  . Number of children: 3  . Years of education: Not on file  . Highest education level: Not on file  Occupational History    Employer: RETIRED    Comment: Retired  Scientific laboratory technician  . Financial resource strain: Not on file  . Food insecurity:    Worry: Not on file    Inability: Not on file  . Transportation needs:    Medical: Not on file    Non-medical: Not on file  Tobacco Use  . Smoking status: Never Smoker  . Smokeless tobacco: Never Used  Substance and Sexual Activity  . Alcohol use: No  . Drug use: No  . Sexual activity: Never  Lifestyle  . Physical activity:    Days per week: Not on file    Minutes per session: Not on file  .  Stress: Not on file  Relationships  . Social connections:    Talks on phone: Not on file    Gets together: Not on file    Attends religious service: Not on file    Active member of club or organization: Not on file    Attends meetings of clubs or organizations: Not on file    Relationship status: Not on file  Other Topics Concern  . Not on file  Social History Narrative  . Not on file    Outpatient Encounter Medications as of 05/26/2017  Medication Sig  . aspirin 81 MG tablet Take 81 mg by mouth daily.    . benazepril (LOTENSIN) 20 MG tablet Take 1 tablet by mouth  daily  . Calcium Carb-Cholecalciferol (CALCIUM-VITAMIN D3) 600-500 MG-UNIT CAPS Take 1 capsule by mouth daily.  Marland Kitchen levothyroxine (SYNTHROID, LEVOTHROID) 112 MCG tablet Take 112 mcg by mouth daily before breakfast.  . lovastatin (MEVACOR) 40 MG tablet Take 1 tablet by  mouth at  bedtime  . Multiple Vitamin (MULTIVITAMIN) tablet Take 1 tablet by mouth daily.    . nitroGLYCERIN (NITROSTAT) 0.4 MG SL tablet Place 1 tablet (0.4 mg total) under the tongue every 5 (five) minutes as needed for chest pain.  Marland Kitchen omeprazole (PRILOSEC) 40 MG capsule Take 1 capsule (40 mg total) by mouth daily as needed.  . propranolol (INDERAL) 20 MG tablet Take 0.5 tablets (10 mg total) by mouth 2 (two) times daily.  Marland Kitchen zolpidem (AMBIEN) 10 MG tablet Take 1 tablet (10 mg total) by mouth at bedtime as needed for sleep.  . [DISCONTINUED] cephALEXin (KEFLEX) 500 MG capsule Take 1 capsule (500 mg total) by mouth 2 (two) times daily.  . [DISCONTINUED] colestipol (COLESTID) 1 G tablet Take 1 g by mouth daily.    . [DISCONTINUED] fluconazole (DIFLUCAN) 100 MG tablet Take 1 tablet (100 mg total) by mouth daily.  . [DISCONTINUED] omeprazole (PRILOSEC) 40 MG capsule Take 1 capsule (40 mg total) by mouth daily.  . [DISCONTINUED] zolpidem (AMBIEN) 10 MG tablet Take 1 tablet (10 mg total) by mouth at bedtime as needed for sleep.   No facility-administered encounter medications on file as of 05/26/2017.     Activities of Daily Living In your present state of health, do you have any difficulty performing the following activities: 05/26/2017 03/25/2017  Hearing? N N  Vision? N N  Difficulty concentrating or making decisions? N N  Walking or climbing stairs? N N  Dressing or bathing? N N  Doing errands, shopping? N N  Preparing Food and eating ? N -  Using the Toilet? N -  In the past six months, have you accidently leaked urine? N -  Do you have problems with loss of bowel control? N -  Managing your Medications? N -  Managing your Finances? N -  Housekeeping or managing your Housekeeping? N -  Some recent data might be hidden    Patient Care Team: Leamon Arnt, MD as PCP - General (Family Medicine) Lafayette Dragon, MD (Inactive) as Consulting Physician (Gastroenterology) Stanford Breed Denice Bors, MD  as Consulting Physician (Cardiology) Regal, Tamala Fothergill, DPM as Consulting Physician (Podiatry) Latanya Maudlin, MD as Consulting Physician (Orthopedic Surgery)    Assessment:   This is a routine wellness examination for Alexis Banks.  Exercise Activities and Dietary recommendations Current Exercise Habits: The patient does not participate in regular exercise at present(Maintains house, plays with dog), Exercise limited by: None identified   Diet (meal preparation, eat out, water  intake, caffeinated beverages, dairy products, fruits and vegetables): Drinks water and 1 Coke.   Breakfast:breakfast bar; juice Lunch: sandwich; salad Dinner: protein and 2 vegetables.   Goals    . Weight (lb) < 165 lb (74.8 kg)     Lose weight by increasing activity.        Fall Risk Fall Risk  05/26/2017 05/26/2017 03/25/2017 12/23/2016 07/02/2013  Falls in the past year? No No No No No     Depression Screen PHQ 2/9 Scores 05/26/2017 05/26/2017 03/25/2017 12/23/2016  PHQ - 2 Score 0 0 0 0  PHQ- 9 Score - 0 0 2     Cognitive Function MMSE - Mini Mental State Exam 05/26/2017  Orientation to time 5  Orientation to Place 5  Registration 3  Attention/ Calculation 5  Recall 1  Language- name 2 objects 2  Language- repeat 1  Language- follow 3 step command 3  Language- read & follow direction 1  Write a sentence 1  Copy design 1  Total score 28        Immunization History  Administered Date(s) Administered  . Influenza, High Dose Seasonal PF 10/07/2012, 10/15/2014, 10/15/2015, 10/26/2016  . Influenza,inj,Quad PF,6+ Mos 10/25/2013  . Pneumococcal Conjugate-13 11/19/2014  . Pneumococcal Polysaccharide-23 08/09/2011, 10/28/2011  . Tdap 07/30/2008  . Zoster 09/09/2011    Screening Tests Health Maintenance  Topic Date Due  . INFLUENZA VACCINE  08/18/2017  . MAMMOGRAM  04/29/2018  . DEXA SCAN  06/04/2018  . TETANUS/TDAP  07/31/2018  . COLONOSCOPY  09/20/2023  . Hepatitis C Screening  Completed  . PNA vac  Low Risk Adult  Completed        Plan:    Continue doing brain stimulating activities (puzzles, reading, adult coloring books, staying active) to keep memory sharp.   Continue to eat heart healthy diet (full of fruits, vegetables, whole grains, lean protein, water--limit salt, fat, and sugar intake) and increase physical activity as tolerated.  I have personally reviewed and noted the following in the patient's chart:   . Medical and social history . Use of alcohol, tobacco or illicit drugs  . Current medications and supplements . Functional ability and status . Nutritional status . Physical activity . Advanced directives . List of other physicians . Hospitalizations, surgeries, and ER visits in previous 12 months . Vitals . Screenings to include cognitive, depression, and falls . Referrals and appointments  In addition, I have reviewed and discussed with patient certain preventive protocols, quality metrics, and best practice recommendations. A written personalized care plan for preventive services as well as general preventive health recommendations were provided to patient.     Gerilyn Nestle, RN  05/26/2017

## 2017-05-26 ENCOUNTER — Other Ambulatory Visit: Payer: Self-pay

## 2017-05-26 ENCOUNTER — Encounter: Payer: Self-pay | Admitting: Family Medicine

## 2017-05-26 ENCOUNTER — Ambulatory Visit (INDEPENDENT_AMBULATORY_CARE_PROVIDER_SITE_OTHER): Payer: Medicare Other

## 2017-05-26 ENCOUNTER — Ambulatory Visit (INDEPENDENT_AMBULATORY_CARE_PROVIDER_SITE_OTHER): Payer: Medicare Other | Admitting: Family Medicine

## 2017-05-26 VITALS — BP 110/82 | HR 63 | Temp 98.1°F | Ht 63.0 in | Wt 173.2 lb

## 2017-05-26 VITALS — BP 110/82 | HR 63 | Ht 63.0 in | Wt 173.2 lb

## 2017-05-26 DIAGNOSIS — Z0000 Encounter for general adult medical examination without abnormal findings: Secondary | ICD-10-CM | POA: Diagnosis not present

## 2017-05-26 DIAGNOSIS — E782 Mixed hyperlipidemia: Secondary | ICD-10-CM | POA: Diagnosis not present

## 2017-05-26 DIAGNOSIS — F5101 Primary insomnia: Secondary | ICD-10-CM | POA: Diagnosis not present

## 2017-05-26 DIAGNOSIS — I10 Essential (primary) hypertension: Secondary | ICD-10-CM

## 2017-05-26 DIAGNOSIS — K2100 Gastro-esophageal reflux disease with esophagitis, without bleeding: Secondary | ICD-10-CM

## 2017-05-26 DIAGNOSIS — E039 Hypothyroidism, unspecified: Secondary | ICD-10-CM | POA: Diagnosis not present

## 2017-05-26 LAB — COMPREHENSIVE METABOLIC PANEL
ALT: 12 U/L (ref 0–35)
AST: 17 U/L (ref 0–37)
Albumin: 4.1 g/dL (ref 3.5–5.2)
Alkaline Phosphatase: 74 U/L (ref 39–117)
BUN: 17 mg/dL (ref 6–23)
CO2: 25 meq/L (ref 19–32)
Calcium: 9.1 mg/dL (ref 8.4–10.5)
Chloride: 106 mEq/L (ref 96–112)
Creatinine, Ser: 0.83 mg/dL (ref 0.40–1.20)
GFR: 71.57 mL/min (ref >60.00)
GLUCOSE: 106 mg/dL — AB (ref 70–99)
Potassium: 4 mEq/L (ref 3.5–5.1)
SODIUM: 140 meq/L (ref 135–145)
Total Bilirubin: 0.7 mg/dL (ref 0.2–1.2)
Total Protein: 6.9 g/dL (ref 6.0–8.3)

## 2017-05-26 LAB — CBC WITH DIFFERENTIAL/PLATELET
Basophils Absolute: 0 10*3/uL (ref 0.0–0.1)
Basophils Relative: 0.5 % (ref 0.0–3.0)
Eosinophils Absolute: 0.2 10*3/uL (ref 0.0–0.7)
Eosinophils Relative: 4.6 % (ref 0.0–5.0)
HCT: 37.9 % (ref 36.0–46.0)
Hemoglobin: 12.2 g/dL (ref 12.0–15.0)
LYMPHS ABS: 2 10*3/uL (ref 0.7–4.0)
Lymphocytes Relative: 38.4 % (ref 12.0–46.0)
MCHC: 32.2 g/dL (ref 30.0–36.0)
MCV: 87.3 fl (ref 78.0–100.0)
MONO ABS: 0.4 10*3/uL (ref 0.1–1.0)
Monocytes Relative: 7.2 % (ref 3.0–12.0)
NEUTROS PCT: 49.3 % (ref 43.0–77.0)
Neutro Abs: 2.5 10*3/uL (ref 1.4–7.7)
PLATELETS: 279 10*3/uL (ref 150.0–400.0)
RBC: 4.34 Mil/uL (ref 3.87–5.11)
RDW: 15.6 % — ABNORMAL HIGH (ref 11.5–15.5)
WBC: 5.1 10*3/uL (ref 4.0–10.5)

## 2017-05-26 LAB — TSH: TSH: 13.87 u[IU]/mL — ABNORMAL HIGH (ref 0.35–4.50)

## 2017-05-26 LAB — LIPID PANEL
Cholesterol: 143 mg/dL (ref 0–200)
HDL: 52.6 mg/dL (ref >39.00)
LDL Cholesterol: 70 mg/dL (ref 0–99)
NONHDL: 90.37
TRIGLYCERIDES: 100 mg/dL (ref 0.0–149.0)
Total CHOL/HDL Ratio: 3
VLDL: 20 mg/dL (ref 0.0–40.0)

## 2017-05-26 MED ORDER — LEVOTHYROXINE SODIUM 125 MCG PO TABS: 125.0000 ug | ORAL_TABLET | Freq: Every day | ORAL | 1 refills | Status: DC

## 2017-05-26 MED ORDER — OMEPRAZOLE 40 MG PO CPDR: 40.0000 mg | DELAYED_RELEASE_CAPSULE | Freq: Every day | ORAL | 3 refills | Status: DC | PRN

## 2017-05-26 MED ORDER — ZOLPIDEM TARTRATE 10 MG PO TABS: 10.0000 mg | ORAL_TABLET | Freq: Every evening | ORAL | 3 refills | Status: DC | PRN

## 2017-05-26 MED FILL — LEVOTHYROXINE 125 MCG TABLE: 125 | 90 days supply | Qty: 90 | Fill #0

## 2017-05-26 NOTE — Progress Notes (Signed)
I have reviewed the documentation from the recent AWV done by Kim Broome; I agree with the documentation and will follow up on any recommendations or abnormal findings as suggested.  

## 2017-05-26 NOTE — Addendum Note (Signed)
Addended by: Billey Chang on: 05/26/2017 04:32 PM   Modules accepted: Orders

## 2017-05-26 NOTE — Patient Instructions (Signed)
Please return in 6 months for blood pressure recheck and insomnia follow up.  If you have any questions or concerns, please don't hesitate to send me a message via MyChart or call the office at [phone]. Thank you for visiting with [place] today! It's our pleasure caring for you.   Please do these things to maintain good health!   Exercise at least 30-45 minutes a day,  4-5 days a week.   Eat a low-fat diet with lots of fruits and vegetables, up to 7-9 servings per day.  Drink plenty of water daily. Try to drink 8 8oz glasses per day.  Seatbelts can save your life. Always wear your seatbelt.  Place Smoke Detectors on every level of your home and check batteries every year.  Schedule an appointment with an eye doctor for an eye exam every 1-2 years  Safe sex - use condoms to protect yourself from STDs if you could be exposed to these types of infections. Use birth control if you do not want to become pregnant and are sexually active.  Avoid heavy alcohol use. If you drink, keep it to less than 2 drinks/day and not every day.  [place].  Choose someone you trust that could speak for you if you became unable to speak for yourself.  Depression is common in our stressful world.If you're feeling down or losing interest in things you normally enjoy, please come in for a visit.  If anyone is threatening or hurting you, please get help. Physical or Emotional Violence is never OK.

## 2017-05-26 NOTE — Progress Notes (Signed)
Subjective  Chief Complaint  Patient presents with  . Annual Exam    doing well. HM up to date   . Insomnia    needs refills on Ambien     HPI: Alexis Banks is a 72 y.o. female who presents to [place] at [facility] today for a Female Wellness Visit. She also has the concerns and/or needs as listed above in the chief complaint. These will be addressed in addition to the Health Maintenance Visit.   Wellness Visit: annual visit with health maintenance review and exam without Pap   Doing great! Husband has been dxd with copd and surveillance for a chest mass; but no dx of cancer. So they are feeling much better about things. She feels well. Energy level is good. Has been traveling. Recovering well from recent neuroma surgery. No complaints. Has awv today as well.  Lifestyle: Body mass index is 30.68 kg/m. Wt Readings from Last 3 Encounters:  05/26/17 173 lb 3.2 oz (78.6 kg)  05/26/17 173 lb 3.2 oz (78.6 kg)  03/25/17 172 lb 3.2 oz (78.1 kg)   Diet: general Exercise: rarely,   Chronic disease management visit and/or acute problem visit: Hypertension f/u: Control is good . Pt reports she is doing well. taking medications as instructed, no medication side effects noted, no TIAs, no chest pain on exertion, no dyspnea on exertion, no swelling of ankles. She denies adverse effects from his BP medications. Compliance with medication is good.   BP Readings from Last 3 Encounters:  05/26/17 110/82  05/26/17 110/82  03/25/17 132/80   Wt Readings from Last 3 Encounters:  05/26/17 173 lb 3.2 oz (78.6 kg)  05/26/17 173 lb 3.2 oz (78.6 kg)  03/25/17 172 lb 3.2 oz (78.1 kg)    Hyperlipidemia f/u: Patient presents for follow up of lipids. Lipids have been well controlled on meds w/o myalgias or side effects. Compliance with treatment thus far has been excellent. The patient does not use medications that may worsen dyslipidemias (corticosteroids, progestins, anabolic  steroids, diuretics, beta-blockers, amiodarone, cyclosporine, olanzapine). The patient exercises infrequently. The patient is not known to have coexisting coronary artery disease.   Hypothyroidism f/u: Alexis Banks is a 72 y.o. female who presents for follow up of hypothyroidism. Last TSH showed control was good, and thyroid supplement medication was adjusted accordingly.  Current symptoms: none . Patient denies change in energy level, diarrhea, heat / cold intolerance, nervousness, palpitations and weight changes. Symptoms have been well-controlled.She has been compliant with the medication. Due for lab recheck.   Patient Active Problem List   Diagnosis Date Noted  . Obesity (BMI 30-39.9) 11/10/2012    Priority: High  . Hypothyroidism 01/09/2007    Priority: High    Qualifier: Diagnosis of  By: Burnice Logan  MD, Doretha Sou    . Hyperlipidemia 01/09/2007    Priority: High    Qualifier: Diagnosis of  By: Burnice Logan  MD, Doretha Sou    . Essential hypertension 01/09/2007    Priority: High    Qualifier: Diagnosis of  By: Burnice Logan  MD, Doretha Sou    . Irritable bowel syndrome with diarrhea 06/17/2015    Priority: Medium  . Osteopenia 09/10/2014    Priority: Medium  . OAB (overactive bladder) 02/24/2012    Priority: Medium  . GERD 03/27/2009    Priority: Medium    Qualifier: Diagnosis of  By: Niel Hummer MD, Lorinda Creed    . Primary insomnia 03/02/2007    Priority: Medium  Qualifier: Diagnosis of  By: Niel Hummer MD, Lorinda Creed    . Uses hearing aid 12/22/2015    Priority: Low   Health Maintenance  Topic Date Due  . INFLUENZA VACCINE  08/18/2017  . MAMMOGRAM  04/29/2018  . DEXA SCAN  06/04/2018  . TETANUS/TDAP  07/31/2018  . COLONOSCOPY  09/20/2023  . Hepatitis C Screening  Completed  . PNA vac Low Risk Adult  Completed   Immunization History  Administered Date(s) Administered  . Influenza, High Dose Seasonal PF 10/07/2012, 10/15/2014, 10/15/2015, 10/26/2016  .  Influenza,inj,Quad PF,6+ Mos 10/25/2013  . Pneumococcal Conjugate-13 11/19/2014  . Pneumococcal Polysaccharide-23 08/09/2011, 10/28/2011  . Tdap 07/30/2008  . Zoster 09/09/2011   We updated and reviewed the patient's past history in detail and it is documented below. Allergies: Patient is allergic to adhesive [tape] and other. Past Medical History Patient  has a past medical history of Anemia, ANXIETY DEPRESSION, Common bile duct dilation, CORONARY ARTERY DISEASE, GERD, Hyperlipidemia, Hypertension, HYPOTHYROIDISM, INSOMNIA, ROTATOR CUFF SYNDROME, RIGHT, Trochanteric bursitis of left hip, VARICOSE VEINS, LOWER EXTREMITIES, and VITAMIN D DEFICIENCY. Past Surgical History Patient  has a past surgical history that includes Cholecystectomy and Rotator cuff surgery. Family History: Patient family history includes Alcohol abuse in her mother; Alzheimer's disease in her sister; COPD in her father. Social History:  Patient  reports that she has never smoked. She has never used smokeless tobacco. She reports that she does not drink alcohol or use drugs.  Review of Systems: Constitutional: negative for fever or malaise Ophthalmic: negative for photophobia, double vision or loss of vision Cardiovascular: negative for chest pain, dyspnea on exertion, or new LE swelling Respiratory: negative for SOB or persistent cough Gastrointestinal: negative for abdominal pain, change in bowel habits or melena Genitourinary: negative for dysuria or gross hematuria, no abnormal uterine bleeding or disharge Musculoskeletal: negative for new gait disturbance or muscular weakness Integumentary: negative for new or persistent rashes, no breast lumps Neurological: negative for TIA or stroke symptoms Psychiatric: negative for SI or delusions Allergic/Immunologic: negative for hives  Patient Care Team    Relationship Specialty Notifications Start End  Leamon Arnt, MD PCP - General Family Medicine  12/23/16     Lafayette Dragon, MD (Inactive) Consulting Physician Gastroenterology  07/30/13   Lelon Perla, MD Consulting Physician Cardiology  07/30/13   Wallene Huh, DPM Consulting Physician Podiatry  05/26/17   Latanya Maudlin, MD Consulting Physician Orthopedic Surgery  05/26/17     Objective  Vitals: BP 110/82   Pulse 63   Temp 98.1 F (36.7 C)   Ht 5\' 3"  (1.6 m)   Wt 173 lb 3.2 oz (78.6 kg)   BMI 30.68 kg/m  General:  Well developed, well nourished, no acute distress  Psych:  Alert and orientedx3,normal mood and affect HEENT:  Normocephalic, atraumatic, non-icteric sclera, PERRL, oropharynx is clear without mass or exudate, supple neck without adenopathy, mass or thyromegaly Cardiovascular:  Normal S1, S2, RRR without gallop, rub or murmur, nondisplaced PMI Respiratory:  Good breath sounds bilaterally, CTAB with normal respiratory effort Gastrointestinal: normal bowel sounds, soft, non-tender, no noted masses. No HSM MSK: no deformities, contusions. Joints are without erythema or swelling. Spine and CVA region are nontender Skin:  Warm, no rashes or suspicious lesions noted Neurologic:    Mental status is normal. CN 2-11 are normal. Gross motor and sensory exams are normal. Normal gait. No tremor Breast Exam: No mass, skin retraction or nipple discharge is appreciated in either  breast. No axillary adenopathy. Fibrocystic changes are not noted  Assessment  1. Annual physical exam   2. Acquired hypothyroidism   3. Mixed hyperlipidemia   4. Essential hypertension   5. Primary insomnia   6. Reflux esophagitis      Plan  Female Wellness Visit:  Age appropriate Health Maintenance and Prevention measures were discussed with patient. Included topics are cancer screening recommendations, ways to keep healthy (see AVS) including dietary and exercise recommendations, regular eye and dental care, use of seat belts, and avoidance of moderate alcohol use and tobacco use.   BMI: discussed  patient's BMI and encouraged positive lifestyle modifications to help get to or maintain a target BMI.  HM needs and immunizations were addressed and ordered. See below for orders. See HM and immunization section for updates.  Routine labs and screening tests ordered including cmp, cbc and lipids where appropriate.  Discussed recommendations regarding Vit D and calcium supplementation (see AVS)  I discussed with patient that the coding for this visit will include the wellness/preventive visit along with the code for a chronic problem follow up or acute/sick problem visit. Documentation reflects the need for this. . Chronic disease f/u and/or acute problem visit: (deemed necessary to be done in addition to the wellness visit):  Chronic problems are well controlled. Recheck lipids, renal function, thyroid and continue same meds.   Refilled sleep meds; uses as needed.   Follow up: Return in about 6 months (around 11/26/2017) for follow up Hypertension, insomnia.   Commons side effects, risks, benefits, and alternatives for medications and treatment plan prescribed today were discussed, and the patient expressed understanding of the given instructions. Patient is instructed to call or message via MyChart if he/she has any questions or concerns regarding our treatment plan. No barriers to understanding were identified. We discussed Red Flag symptoms and signs in detail. Patient expressed understanding regarding what to do in case of urgent or emergency type symptoms.   Medication list was reconciled, printed and provided to the patient in AVS. Patient instructions and summary information was reviewed with the patient as documented in the AVS. This note was prepared with assistance of Dragon voice recognition software. Occasional wrong-word or sound-a-like substitutions may have occurred due to the inherent limitations of voice recognition software  Orders Placed This Encounter  Procedures  .  Comprehensive metabolic panel  . CBC with Differential/Platelet  . TSH  . Lipid panel   Meds ordered this encounter  Medications  . omeprazole (PRILOSEC) 40 MG capsule    Sig: Take 1 capsule (40 mg total) by mouth daily as needed.    Dispense:  90 capsule    Refill:  3  . zolpidem (AMBIEN) 10 MG tablet    Sig: Take 1 tablet (10 mg total) by mouth at bedtime as needed for sleep.    Dispense:  30 tablet    Refill:  3

## 2017-05-26 NOTE — Patient Instructions (Addendum)
Continue doing brain stimulating activities (puzzles, reading, adult coloring books, staying active) to keep memory sharp.   Continue to eat heart healthy diet (full of fruits, vegetables, whole grains, lean protein, water--limit salt, fat, and sugar intake) and increase physical activity as tolerated.  Health Maintenance, Female Adopting a healthy lifestyle and getting preventive care can go a long way to promote health and wellness. Talk with your health care provider about what schedule of regular examinations is right for you. This is a good chance for you to check in with your provider about disease prevention and staying healthy. In between checkups, there are plenty of things you can do on your own. Experts have done a lot of research about which lifestyle changes and preventive measures are most likely to keep you healthy. Ask your health care provider for more information. Weight and diet Eat a healthy diet  Be sure to include plenty of vegetables, fruits, low-fat dairy products, and lean protein.  Do not eat a lot of foods high in solid fats, added sugars, or salt.  Get regular exercise. This is one of the most important things you can do for your health. ? Most adults should exercise for at least 150 minutes each week. The exercise should increase your heart rate and make you sweat (moderate-intensity exercise). ? Most adults should also do strengthening exercises at least twice a week. This is in addition to the moderate-intensity exercise.  Maintain a healthy weight  Body mass index (BMI) is a measurement that can be used to identify possible weight problems. It estimates body fat based on height and weight. Your health care provider can help determine your BMI and help you achieve or maintain a healthy weight.  For females 20 years of age and older: ? A BMI below 18.5 is considered underweight. ? A BMI of 18.5 to 24.9 is normal. ? A BMI of 25 to 29.9 is considered  overweight. ? A BMI of 30 and above is considered obese.  Watch levels of cholesterol and blood lipids  You should start having your blood tested for lipids and cholesterol at 73 years of age, then have this test every 5 years.  You may need to have your cholesterol levels checked more often if: ? Your lipid or cholesterol levels are high. ? You are older than 73 years of age. ? You are at high risk for heart disease.  Cancer screening Lung Cancer  Lung cancer screening is recommended for adults 55-80 years old who are at high risk for lung cancer because of a history of smoking.  A yearly low-dose CT scan of the lungs is recommended for people who: ? Currently smoke. ? Have quit within the past 15 years. ? Have at least a 30-pack-year history of smoking. A pack year is smoking an average of one pack of cigarettes a day for 1 year.  Yearly screening should continue until it has been 15 years since you quit.  Yearly screening should stop if you develop a health problem that would prevent you from having lung cancer treatment.  Breast Cancer  Practice breast self-awareness. This means understanding how your breasts normally appear and feel.  It also means doing regular breast self-exams. Let your health care provider know about any changes, no matter how small.  If you are in your 20s or 30s, you should have a clinical breast exam (CBE) by a health care provider every 1-3 years as part of a regular health exam.  If   If you are 40 or older, have a CBE every year. Also consider having a breast X-ray (mammogram) every year.  If you have a family history of breast cancer, talk to your health care provider about genetic screening.  If you are at high risk for breast cancer, talk to your health care provider about having an MRI and a mammogram every year.  Breast cancer gene (BRCA) assessment is recommended for women who have family members with BRCA-related cancers. BRCA-related cancers  include: ? Breast. ? Ovarian. ? Tubal. ? Peritoneal cancers.  Results of the assessment will determine the need for genetic counseling and BRCA1 and BRCA2 testing.  Cervical Cancer Your health care provider may recommend that you be screened regularly for cancer of the pelvic organs (ovaries, uterus, and vagina). This screening involves a pelvic examination, including checking for microscopic changes to the surface of your cervix (Pap test). You may be encouraged to have this screening done every 3 years, beginning at age 21.  For women ages 30-65, health care providers may recommend pelvic exams and Pap testing every 3 years, or they may recommend the Pap and pelvic exam, combined with testing for human papilloma virus (HPV), every 5 years. Some types of HPV increase your risk of cervical cancer. Testing for HPV may also be done on women of any age with unclear Pap test results.  Other health care providers may not recommend any screening for nonpregnant women who are considered low risk for pelvic cancer and who do not have symptoms. Ask your health care provider if a screening pelvic exam is right for you.  If you have had past treatment for cervical cancer or a condition that could lead to cancer, you need Pap tests and screening for cancer for at least 20 years after your treatment. If Pap tests have been discontinued, your risk factors (such as having a new sexual partner) need to be reassessed to determine if screening should resume. Some women have medical problems that increase the chance of getting cervical cancer. In these cases, your health care provider may recommend more frequent screening and Pap tests.  Colorectal Cancer  This type of cancer can be detected and often prevented.  Routine colorectal cancer screening usually begins at 73 years of age and continues through 73 years of age.  Your health care provider may recommend screening at an earlier age if you have risk factors  for colon cancer.  Your health care provider may also recommend using home test kits to check for hidden blood in the stool.  A small camera at the end of a tube can be used to examine your colon directly (sigmoidoscopy or colonoscopy). This is done to check for the earliest forms of colorectal cancer.  Routine screening usually begins at age 50.  Direct examination of the colon should be repeated every 5-10 years through 73 years of age. However, you may need to be screened more often if early forms of precancerous polyps or small growths are found.  Skin Cancer  Check your skin from head to toe regularly.  Tell your health care provider about any new moles or changes in moles, especially if there is a change in a mole's shape or color.  Also tell your health care provider if you have a mole that is larger than the size of a pencil eraser.  Always use sunscreen. Apply sunscreen liberally and repeatedly throughout the day.  Protect yourself by wearing long sleeves, pants, a wide-brimmed hat, and   sunglasses whenever you are outside.  Heart disease, diabetes, and high blood pressure  High blood pressure causes heart disease and increases the risk of stroke. High blood pressure is more likely to develop in: ? People who have blood pressure in the high end of the normal range (130-139/85-89 mm Hg). ? People who are overweight or obese. ? People who are African American.  If you are 25-70 years of age, have your blood pressure checked every 3-5 years. If you are 86 years of age or older, have your blood pressure checked every year. You should have your blood pressure measured twice-once when you are at a hospital or clinic, and once when you are not at a hospital or clinic. Record the average of the two measurements. To check your blood pressure when you are not at a hospital or clinic, you can use: ? An automated blood pressure machine at a pharmacy. ? A home blood pressure monitor.  If  you are between 34 years and 33 years old, ask your health care provider if you should take aspirin to prevent strokes.  Have regular diabetes screenings. This involves taking a blood sample to check your fasting blood sugar level. ? If you are at a normal weight and have a low risk for diabetes, have this test once every three years after 73 years of age. ? If you are overweight and have a high risk for diabetes, consider being tested at a younger age or more often. Preventing infection Hepatitis B  If you have a higher risk for hepatitis B, you should be screened for this virus. You are considered at high risk for hepatitis B if: ? You were born in a country where hepatitis B is common. Ask your health care provider which countries are considered high risk. ? Your parents were born in a high-risk country, and you have not been immunized against hepatitis B (hepatitis B vaccine). ? You have HIV or AIDS. ? You use needles to inject street drugs. ? You live with someone who has hepatitis B. ? You have had sex with someone who has hepatitis B. ? You get hemodialysis treatment. ? You take certain medicines for conditions, including cancer, organ transplantation, and autoimmune conditions.  Hepatitis C  Blood testing is recommended for: ? Everyone born from [birth year] through 1966. ? Anyone with known risk factors for hepatitis C.  Sexually transmitted infections (STIs)  You should be screened for sexually transmitted infections (STIs) including gonorrhea and chlamydia if: ? You are sexually active and are younger than 73 years of age. ? You are older than 73 years of age and your health care provider tells you that you are at risk for this type of infection. ? Your sexual activity has changed since you were last screened and you are at an increased risk for chlamydia or gonorrhea. Ask your health care provider if you are at risk.  If you do not have HIV, but are at risk, it may be recommended  that you take a prescription medicine daily to prevent HIV infection. This is called pre-exposure prophylaxis (PrEP). You are considered at risk if: ? You are sexually active and do not regularly use condoms or know the HIV status of your partner(s). ? You take drugs by injection. ? You are sexually active with a partner who has HIV.  Talk with your health care provider about whether you are at high risk of being infected with HIV. If you choose to begin PrEP, you  should first be tested for HIV. You should then be tested every 3 months for as long as you are taking PrEP. Pregnancy  If you are premenopausal and you may become pregnant, ask your health care provider about preconception counseling.  If you may become pregnant, take 400 to 800 micrograms (mcg) of folic acid every day.  If you want to prevent pregnancy, talk to your health care provider about birth control (contraception). Osteoporosis and menopause  Osteoporosis is a disease in which the bones lose minerals and strength with aging. This can result in serious bone fractures. Your risk for osteoporosis can be identified using a bone density scan.  If you are 65 years of age or older, or if you are at risk for osteoporosis and fractures, ask your health care provider if you should be screened.  Ask your health care provider whether you should take a calcium or vitamin D supplement to lower your risk for osteoporosis.  Menopause may have certain physical symptoms and risks.  Hormone replacement therapy may reduce some of these symptoms and risks. Talk to your health care provider about whether hormone replacement therapy is right for you. Follow these instructions at home:  Schedule regular health, dental, and eye exams.  Stay current with your immunizations.  Do not use any tobacco products including cigarettes, chewing tobacco, or electronic cigarettes.  If you are pregnant, do not drink alcohol.  If you are  breastfeeding, limit how much and how often you drink alcohol.  Limit alcohol intake to no more than 1 drink per day for nonpregnant women. One drink equals 12 ounces of beer, 5 ounces of wine, or 1 ounces of hard liquor.  Do not use street drugs.  Do not share needles.  Ask your health care provider for help if you need support or information about quitting drugs.  Tell your health care provider if you often feel depressed.  Tell your health care provider if you have ever been abused or do not feel safe at home. This information is not intended to replace advice given to you by your health care provider. Make sure you discuss any questions you have with your health care provider. Document Released: 07/20/2010 Document Revised: 06/12/2015 Document Reviewed: 10/08/2014 [employer] Interactive Patient Education  2018 [employer]  

## 2017-06-10 MED FILL — ZOLPIDEM TARTRATE 10 MG TAB: 10 | 30 days supply | Qty: 30 | Fill #0

## 2017-07-06 ENCOUNTER — Telehealth: Payer: Self-pay | Admitting: *Deleted

## 2017-07-06 NOTE — Telephone Encounter (Signed)
Left message informing pt that burning could be a sign of infection or swelling to call for an appt and if there was increased redness, swelling and or drainage to go to the ED. I told her I would have a scheduler call tomorrow morning.

## 2017-07-06 NOTE — Telephone Encounter (Signed)
Pt states she had surgery 03/29/2017 and now she has burning.

## 2017-07-07 ENCOUNTER — Telehealth: Payer: Self-pay | Admitting: Podiatry

## 2017-07-07 NOTE — Telephone Encounter (Signed)
Spoke to patient who was upset that she is outside of her global period and would like to come to be seen about her toe that she is still having issues with. I explained that the insurance is set up with the global period and sadly we cannot change their requirements. I would love to be able tot schedule her with an appt with doctor but it would have to be considered an office visit. Patient has stated that if she continues to have issues she will find another doctor to see. Apologized to the patient and stated that we hoped that she would return to [place] in the future if she would allow [place].

## 2017-07-08 ENCOUNTER — Ambulatory Visit: Payer: Medicare Other | Admitting: Podiatry

## 2017-07-20 MED FILL — ZOLPIDEM TARTRATE 10 MG TAB: 10 | 30 days supply | Qty: 30 | Fill #1

## 2017-08-01 ENCOUNTER — Other Ambulatory Visit: Payer: Medicare Other

## 2017-08-04 ENCOUNTER — Telehealth: Payer: Self-pay | Admitting: Emergency Medicine

## 2017-08-04 NOTE — Telephone Encounter (Signed)
Copied from [place] [id number]. Topic: General - Other >> Aug 04, 2017  9:03 AM Oneta Rack wrote: Relation to pt: self  Call back number: [phone]   Reason for call:  Patient had labs on 05/26/17 and and as per results  will recheck in 6-8 weeks. TSH order has been placed, chart does't reflect orders, please advise   Patient missed lab appointment on 08/01/2017, I rescheduled patient for 08/12/2017

## 2017-08-12 ENCOUNTER — Other Ambulatory Visit (INDEPENDENT_AMBULATORY_CARE_PROVIDER_SITE_OTHER): Payer: Medicare Other

## 2017-08-12 DIAGNOSIS — E039 Hypothyroidism, unspecified: Secondary | ICD-10-CM | POA: Diagnosis not present

## 2017-08-12 DIAGNOSIS — R7309 Other abnormal glucose: Secondary | ICD-10-CM | POA: Diagnosis not present

## 2017-08-12 LAB — COMPREHENSIVE METABOLIC PANEL
ALK PHOS: 82 U/L (ref 39–117)
ALT: 15 U/L (ref 0–35)
AST: 20 U/L (ref 0–37)
Albumin: 4 g/dL (ref 3.5–5.2)
BILIRUBIN TOTAL: 0.7 mg/dL (ref 0.2–1.2)
BUN: 15 mg/dL (ref 6–23)
CALCIUM: 9.4 mg/dL (ref 8.4–10.5)
CO2: 27 meq/L (ref 19–32)
Chloride: 103 mEq/L (ref 96–112)
Creatinine, Ser: 0.85 mg/dL (ref 0.40–1.20)
GFR: 69.59 mL/min (ref >60.00)
Glucose, Bld: 109 mg/dL — ABNORMAL HIGH (ref 70–99)
Potassium: 4.5 mEq/L (ref 3.5–5.1)
Sodium: 139 mEq/L (ref 135–145)
Total Protein: 6.9 g/dL (ref 6.0–8.3)

## 2017-08-12 LAB — TSH: TSH: 0.32 u[IU]/mL — ABNORMAL LOW (ref 0.35–4.50)

## 2017-08-16 ENCOUNTER — Telehealth: Payer: Self-pay | Admitting: Family Medicine

## 2017-08-16 NOTE — Telephone Encounter (Signed)
See labs. Pt notifed  Copied from [place] [id number]. Topic: Inquiry >> Aug 15, 2017  2:14 PM Conception Chancy, NT wrote: Reason for CRM: patient would like her lab results from 08/12/17.  >> Aug 15, 2017  2:22 PM Brodmerkel, Fraser Din, CMA wrote: Pt lab results are back, but there is no documentation on them.

## 2017-08-22 MED FILL — ZOLPIDEM TARTRATE 10 MG TAB: 10 | 30 days supply | Qty: 30 | Fill #2

## 2017-08-22 MED FILL — LEVOTHYROXINE 125 MCG TABLE: 125 | 90 days supply | Qty: 90 | Fill #1

## 2017-09-21 MED FILL — ZOLPIDEM TARTRATE 10 MG TAB: 10 | 30 days supply | Qty: 30 | Fill #3

## 2017-10-13 ENCOUNTER — Encounter: Payer: Self-pay | Admitting: Family Medicine

## 2017-10-13 ENCOUNTER — Other Ambulatory Visit: Payer: Self-pay

## 2017-10-13 ENCOUNTER — Ambulatory Visit (INDEPENDENT_AMBULATORY_CARE_PROVIDER_SITE_OTHER): Payer: Medicare Other | Admitting: Family Medicine

## 2017-10-13 VITALS — BP 120/80 | HR 80 | Temp 97.9°F | Ht 63.0 in | Wt 167.4 lb

## 2017-10-13 DIAGNOSIS — Z23 Encounter for immunization: Secondary | ICD-10-CM | POA: Diagnosis not present

## 2017-10-13 DIAGNOSIS — G629 Polyneuropathy, unspecified: Secondary | ICD-10-CM | POA: Diagnosis not present

## 2017-10-13 MED ORDER — ZOLPIDEM TARTRATE 10 MG PO TABS: 10.0000 mg | ORAL_TABLET | Freq: Every evening | ORAL | 3 refills | Status: DC | PRN

## 2017-10-13 MED ORDER — GABAPENTIN 100 MG PO CAPS: 100.0000 mg | ORAL_CAPSULE | Freq: Three times a day (TID) | ORAL | 1 refills | Status: DC

## 2017-10-13 MED FILL — GABAPENTIN 100 MG CAPSULE: 100 | 30 days supply | Qty: 90 | Fill #0

## 2017-10-13 NOTE — Progress Notes (Signed)
Subjective  CC:  Chief Complaint  Patient presents with  . Foot Injury    Had surgery on foot in April, she states that it is burning, wants the flu shot today     HPI: Alexis Banks is a 72 y.o. female who presents to the office today to address the problems listed above in the chief complaint.  72 year old female status post neuroma excision in April of this year by Dr. Paulla Dolly.  Has postoperative cellulitis which is resolved.  However since has persistent and mildly worsening paresthesias that started at the top of the foot and travel laterally.  They are constant and progressive.  They keep her awake at night.  Worse when walking.  She denies rashes, redness, swelling, trauma, left sided involvement, claudication.  She is not able to make a follow-up with podiatry.  No history of peripheral neuropathy.  She is not a diabetic. Assessment  1. Neuropathy   2. 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  Start 100 nightly and titrate up to 3 times daily.  Recheck in 2 to 4 weeks if not improving.  Influenza vaccination today  Follow up: Has follow-up scheduled in November.  Sooner if needed  Orders Placed This Encounter  Procedures  . Flu vaccine HIGH DOSE PF   Meds ordered this encounter  Medications  . gabapentin (NEURONTIN) 100 MG capsule    Sig: Take 1 capsule (100 mg total) by mouth 3 (three) times daily.    Dispense:  90 capsule    Refill:  1  . zolpidem (AMBIEN) 10 MG tablet    Sig: Take 1 tablet (10 mg total) by mouth at bedtime as needed for sleep.    Dispense:  30 tablet    Refill:  3      I reviewed the patients updated PMH, FH, and SocHx.    Patient Active Problem List   Diagnosis Date Noted  . Obesity (BMI 30-39.9) 11/10/2012    Priority: High  . Hypothyroidism 01/09/2007    Priority: High  . Hyperlipidemia 01/09/2007    Priority: High  . Essential hypertension  01/09/2007    Priority: High  . Irritable bowel syndrome with diarrhea 06/17/2015    Priority: Medium  . Osteopenia 09/10/2014    Priority: Medium  . OAB (overactive bladder) 02/24/2012    Priority: Medium  . GERD 03/27/2009    Priority: Medium  . Primary insomnia 03/02/2007    Priority: Medium  . Uses hearing aid 12/22/2015    Priority: Low   Current Meds  Medication Sig  . aspirin 81 MG tablet Take 81 mg by mouth daily.    . benazepril (LOTENSIN) 20 MG tablet Take 1 tablet by mouth  daily  . Calcium Carb-Cholecalciferol (CALCIUM-VITAMIN D3) 600-500 MG-UNIT CAPS Take 1 capsule by mouth daily.  Marland Kitchen levothyroxine (SYNTHROID, LEVOTHROID) 125 MCG tablet Take 1 tablet (125 mcg total) by mouth daily before breakfast.  . lovastatin (MEVACOR) 40 MG tablet Take 1 tablet by mouth at  bedtime  . Multiple Vitamin (MULTIVITAMIN) tablet Take 1 tablet by mouth daily.    . nitroGLYCERIN (NITROSTAT) 0.4 MG SL tablet Place 1 tablet (0.4 mg total) under the tongue every 5 (five) minutes as needed for chest pain.  Marland Kitchen omeprazole (PRILOSEC) 40 MG capsule Take 1 capsule (40 mg total) by mouth daily as needed.  . propranolol (INDERAL) 20 MG tablet Take  0.5 tablets (10 mg total) by mouth 2 (two) times daily.  Marland Kitchen zolpidem (AMBIEN) 10 MG tablet Take 1 tablet (10 mg total) by mouth at bedtime as needed for sleep.  . [DISCONTINUED] zolpidem (AMBIEN) 10 MG tablet Take 1 tablet (10 mg total) by mouth at bedtime as needed for sleep.    Allergies: Patient is allergic to adhesive [tape] and other. Family History: Patient family history includes Alcohol abuse in her mother; Alzheimer's disease in her sister; COPD in her father. Social History:  Patient  reports that she has never smoked. She has never used smokeless tobacco. She reports that she does not drink alcohol or use drugs.  Review of Systems: Constitutional: Negative for fever malaise or anorexia Cardiovascular: negative for chest pain Respiratory:  negative for SOB or persistent cough Gastrointestinal: negative for abdominal pain  Objective  Vitals: BP 120/80   Pulse 80   Temp 97.9 F (36.6 C)   Ht 5\' 3"  (1.6 m)   Wt 167 lb 6.4 oz (75.9 kg)   SpO2 98%   BMI 29.65 kg/m  General: no acute distress , A&Ox3 HEENT: PEERL, conjunctiva normal, Oropharynx moist,neck is supple Cardiovascular:  RRR without murmur or gallop.  Respiratory:  Good breath sounds bilaterally, CTAB with normal respiratory effort Skin:  Warm, no rashes Right foot with well-healed excision between third and fourth toes.  No redness, swelling or tenderness.  +1 distal pulses bilaterally, no peripheral edema.  Normal range of motion of ankle.  Normal gait     Commons side effects, risks, benefits, and alternatives for medications and treatment plan prescribed today were discussed, and the patient expressed understanding of the given instructions. Patient is instructed to call or message via MyChart if he/she has any questions or concerns regarding our treatment plan. No barriers to understanding were identified. We discussed Red Flag symptoms and signs in detail. Patient expressed understanding regarding what to do in case of urgent or emergency type symptoms.   Medication list was reconciled, printed and provided to the patient in AVS. Patient instructions and summary information was reviewed with the patient as documented in the AVS. This note was prepared with assistance of Dragon voice recognition software. Occasional wrong-word or sound-a-like substitutions may have occurred due to the inherent limitations of voice recognition software

## 2017-10-13 NOTE — Patient Instructions (Signed)
Follow up as scheduled.   Start taking the medications once nightly, then increase slowly to 3x/day as tolerated.   Come back in 3-4 weeks if it is not improved for medication adjustment.    Focal Neuropathy Focal neuropathy is a nerve injury that affects one area of the body, such as an arm, a leg, or the face. The injury may involve one nerve or a small group of nerves. Examples of focal neuropathy include brachial plexus injury and Parson[place]-Turner syndrome. What are the causes? This condition may be caused by:  A sudden cut or stretch. This can happen because of an accident or during surgery.  A small injury that happens again and again.  A compression injury. This kind of injury happens when a blood vessel, a tumor, or something else presses on a nerve for a long time.  Entrapment. This can happen to a nerve that has to pass through a narrow area.  Lack of blood to the nerves. This can be caused by certain medical conditions, like diabetes or vasculitis.  Extreme cold.  Severe burns.  Radiation.  What are the signs or symptoms? Symptoms of this condition depend on where the damaged nerve is and what kind of nerve it is. For example, damage to nerves that carry signals away from the brain can cause symptoms related to movement, but damage to nerves that carry signals to the brain can cause symptoms related to feeling and pain. Symptoms affect only one area of the body. Common symptoms include:  Numbness.  Tingling.  A burning pain.  A prickling feeling.  Very sensitive skin.  Weakness.  Paralysis.  Muscle twitching.  Muscles getting smaller (muscle wasting).  Poor coordination.  How is this diagnosed? This condition may be diagnosed with:  A neurologic exam. During the exam, your health care provider will check your reflexes, how you move, and what you can feel.  Tests. Tests may be done to help find the area that has been damaged. Tests may  include: ? Imaging tests, such as a CT scan or MRI. ? Electromyogram (EMG). This test checks the nerves that control your muscles. ? Nerve conduction velocity (NCV) tests. These tests check how quickly messages pass through your nerves.  How is this treated? Treatment for this condition depends on what damaged the nerve and how much of the nerve is damaged. Treatment may involve:  Surgery to ease pressure on a nerve. This may be done if you have a compression injury or entrapment.  Medicines, such as: ? Medicines for pain, such painkillers, certain anti-seizure medicines, or antidepressants. ? Steroid medicines. These may be given in pill form or with a shot (injection).  Physical therapy (PT) to help movement.  Splints or other devices to help movement.  Follow these instructions at home:   Take over-the-counter and prescription medicines only as told by your health care provider.  If you were given a splint or other device to help with movement, use it as told by your health care provider.  If any part of your body is numb, check it every day for signs of injury or infection. Watch for: ? Redness, swelling, or pain. ? Fluid or blood. ? Warmth. ? Pus or a bad smell.  Do not do things that put pressure on your damaged nerve. You may have to avoid certain activities or avoid sitting or lying in a certain way.  Do not smoke. Smoking keeps blood from getting to damaged nerves. If you need help  quitting, ask your health care provider.  Limit alcohol intake, or avoid alcohol completely. Too much alcohol can cause a lack of B vitamins, which are needed for healthy nerves.  Have a good support system. Coping with focal neuropathy can be stressful. Talk with a mental health caregiver or join a support group if you are struggling.  Keep all follow-up visits as told by your health care provider. This is important. These include PT visits. Contact a health care provider if:  You think  you have an injury or infection.  You have new symptoms.  You are struggling emotionally from dealing with your condition. Get help right away if:  You have severe pain that comes on suddenly.  You develop any paralysis.  You lose feeling in any part of your body. This information is not intended to replace advice given to you by your health care provider. Make sure you discuss any questions you have with your health care provider. Document Released: 12/16/2014 Document Revised: 06/12/2015 Document Reviewed: 08/02/2014 Elsevier Interactive Patient Education  [employer].

## 2017-10-27 MED FILL — ZOLPIDEM TARTRATE 10 MG TAB: 10 | 30 days supply | Qty: 30 | Fill #0

## 2017-10-31 ENCOUNTER — Other Ambulatory Visit: Payer: Self-pay

## 2017-10-31 NOTE — Patient Outreach (Signed)
[facility]) Care Management  10/31/2017  [name] [birth year] [id number]   Medication Adherence call to [name] left a message for patient to call back patient is due on Lovastatin and Benazepril 20 mg under [name].   [facility] Management Direct Dial [phone]  Fax [phone] [email or web address]

## 2017-11-18 ENCOUNTER — Other Ambulatory Visit: Payer: Self-pay

## 2017-11-18 NOTE — Patient Outreach (Signed)
[facility]) Care Management  11/18/2017  Alexis Banks 02/12/1945 [id number]   Medication Adherence call to [name] left a message for patient to call back patient is due on Lovastatin 40 mg and Benazepril 20. [name] is showing past due under [facility].   [place] Management Direct Dial [phone]  Fax [phone] [name]

## 2017-11-22 ENCOUNTER — Ambulatory Visit: Payer: Medicare Other | Admitting: Family Medicine

## 2017-11-23 ENCOUNTER — Other Ambulatory Visit: Payer: Self-pay

## 2017-11-23 ENCOUNTER — Encounter: Payer: Self-pay | Admitting: Family Medicine

## 2017-11-23 ENCOUNTER — Ambulatory Visit (INDEPENDENT_AMBULATORY_CARE_PROVIDER_SITE_OTHER): Payer: Medicare Other | Admitting: Family Medicine

## 2017-11-23 VITALS — BP 116/83 | HR 58 | Temp 98.3°F | Ht 63.0 in | Wt 171.2 lb

## 2017-11-23 DIAGNOSIS — E039 Hypothyroidism, unspecified: Secondary | ICD-10-CM | POA: Diagnosis not present

## 2017-11-23 DIAGNOSIS — H57051 Tonic pupil, right eye: Secondary | ICD-10-CM | POA: Insufficient documentation

## 2017-11-23 DIAGNOSIS — I10 Essential (primary) hypertension: Secondary | ICD-10-CM | POA: Diagnosis not present

## 2017-11-23 DIAGNOSIS — F5101 Primary insomnia: Secondary | ICD-10-CM

## 2017-11-23 DIAGNOSIS — G629 Polyneuropathy, unspecified: Secondary | ICD-10-CM | POA: Diagnosis not present

## 2017-11-23 LAB — TSH: TSH: 0.43 u[IU]/mL (ref 0.35–4.50)

## 2017-11-23 MED ORDER — ZOSTER VAC RECOMB ADJUVANTED 50 MCG/0.5ML IM SUSR: 0.5000 mL | Freq: Once | INTRAMUSCULAR | 0 refills | Status: AC

## 2017-11-23 MED FILL — ZOLPIDEM TARTRATE 10 MG TAB: 10 | 30 days supply | Qty: 30 | Fill #1

## 2017-11-23 MED FILL — GABAPENTIN 100 MG CAPSULE: 100 | 30 days supply | Qty: 90 | Fill #1

## 2017-11-23 NOTE — Progress Notes (Signed)
Subjective  CC:  Chief Complaint  Patient presents with  . Hypertension    doing well   . Insomnia    sleep is much better, doing well on Ambien   . Hypothyroidism    HPI: Alexis Banks is a 72 y.o. female who presents to the office today to address the problems listed above in the chief complaint.  Hypertension f/u: Control is good . Pt reports she is doing well. taking medications as instructed, no medication side effects noted, no TIAs, no chest pain on exertion, no dyspnea on exertion, no swelling of ankles. She denies adverse effects from his BP medications. Compliance with medication is good.   Hypothyroidism follow-up: We did increase her dose and recheck her levels back in September.  Date improved.  Clinically she feels well.  Energy level is terrific.  No symptoms of high or low thyroid.  She has been 100% compliant by report. Lab Results  Component Value Date   TSH 0.32 (L) 08/12/2017    Neuropathy, postoperative: Pain is much improved.  Never started gabapentin.  Primary insomnia: Using Ambien 2-3 times per week.  Sleep is improved.  Has not had adverse effects.  Understands risks and benefits.  Assessment  1. Essential hypertension   2. Acquired hypothyroidism   3. Neuropathy   4. Primary insomnia   5. Adie's tonic pupil, right      Plan    Hypertension f/u: BP control is well controlled.  Continue same medications  Hypothyroidism f/u: Recheck today to ensure TSH is at goal.  Clinically she is euthyroid  Postoperative neuropathy is much improved.  Hold off on starting gabapentin.  Primary insomnia: Intermittent Ambien use.  Stable for now.  Shingrix prescription given to patient.  Education given Education regarding management of these chronic disease states was given. Management strategies discussed on successive visits include dietary and exercise recommendations, goals of achieving and maintaining IBW, and lifestyle modifications aiming for adequate  sleep and minimizing stressors.   Follow up: Return in about 6 months (around 05/24/2018) for complete physical, AWV.  Orders Placed This Encounter  Procedures  . TSH   Meds ordered this encounter  Medications  . Zoster Vaccine Adjuvanted [facility]) injection    Sig: Inject 0.5 mLs into the muscle once for 1 dose. Please give 2nd dose 2-6 months after first dose    Dispense:  2 each    Refill:  0      BP Readings from Last 3 Encounters:  11/23/17 116/83  10/13/17 120/80  05/26/17 110/82   Wt Readings from Last 3 Encounters:  11/23/17 171 lb 3.2 oz (77.7 kg)  10/13/17 167 lb 6.4 oz (75.9 kg)  05/26/17 173 lb 3.2 oz (78.6 kg)    Lab Results  Component Value Date   CHOL 143 05/26/2017   CHOL 145 01/31/2014   CHOL 126 07/30/2013   Lab Results  Component Value Date   HDL 52.60 05/26/2017   HDL 53.30 01/31/2014   HDL 45.90 07/30/2013   Lab Results  Component Value Date   LDLCALC 70 05/26/2017   LDLCALC 66 01/31/2014   LDLCALC 54 07/30/2013   Lab Results  Component Value Date   TRIG 100.0 05/26/2017   TRIG 128.0 01/31/2014   TRIG 130.0 07/30/2013   Lab Results  Component Value Date   CHOLHDL 3 05/26/2017   CHOLHDL 3 01/31/2014   CHOLHDL 3 07/30/2013   Lab Results  Component Value Date   LDLDIRECT 112.6 11/07/2008  LDLDIRECT 136.6 01/09/2007   Lab Results  Component Value Date   CREATININE 0.85 08/12/2017   BUN 15 08/12/2017   NA 139 08/12/2017   K 4.5 08/12/2017   CL 103 08/12/2017   CO2 27 08/12/2017    The 10-year ASCVD risk score [name], et al., 2013) is: 13.6%   Values used to calculate the score:     Age: 14 years     Sex: Female     Is Non-Hispanic African American: No     Diabetic: No     Tobacco smoker: No     Systolic Blood Pressure: 161 mmHg     Is BP treated: Yes     HDL Cholesterol: 52.6 mg/dL     Total Cholesterol: 143 mg/dL  I reviewed the patients updated PMH, FH, and SocHx.    Patient Active Problem List   Diagnosis  Date Noted  . Obesity (BMI 30-39.9) 11/10/2012    Priority: High  . Hypothyroidism 01/09/2007    Priority: High  . Hyperlipidemia 01/09/2007    Priority: High  . Essential hypertension 01/09/2007    Priority: High  . Irritable bowel syndrome with diarrhea 06/17/2015    Priority: Medium  . Osteopenia 09/10/2014    Priority: Medium  . OAB (overactive bladder) 02/24/2012    Priority: Medium  . GERD 03/27/2009    Priority: Medium  . Primary insomnia 03/02/2007    Priority: Medium  . 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 Uses hearing aid 12/22/2015    Priority: Low    Allergies: Adhesive [tape] and Other  Social History: Patient  reports that she has never smoked. She has never used smokeless tobacco. She reports that she does not drink alcohol or use drugs.  Current Meds  Medication Sig  . aspirin 81 MG tablet Take 81 mg by mouth daily.    . benazepril (LOTENSIN) 20 MG tablet Take 1 tablet by mouth  daily  . Calcium Carb-Cholecalciferol (CALCIUM-VITAMIN D3) 600-500 MG-UNIT CAPS Take 1 capsule by mouth daily.  Marland Kitchen levothyroxine (SYNTHROID, LEVOTHROID) 125 MCG tablet Take 1 tablet (125 mcg total) by mouth daily before breakfast.  . lovastatin (MEVACOR) 40 MG tablet Take 1 tablet by mouth at  bedtime  . Multiple Vitamin (MULTIVITAMIN) tablet Take 1 tablet by mouth daily.    . propranolol (INDERAL) 20 MG tablet Take 0.5 tablets (10 mg total) by mouth 2 (two) times daily.  Marland Kitchen zolpidem (AMBIEN) 10 MG tablet Take 1 tablet (10 mg total) by mouth at bedtime as needed for sleep.    Review of Systems: Cardiovascular: negative for chest pain, palpitations, leg swelling, orthopnea Respiratory: negative for SOB, wheezing or persistent cough Gastrointestinal: negative for abdominal pain Genitourinary: negative for dysuria or gross hematuria  Objective  Vitals: BP 116/83   Pulse (!) 58   Temp 98.3 F (36.8 C)   Ht 5\' 3"  (1.6 m)   Wt 171 lb 3.2 oz (77.7 kg)   SpO2  97%   BMI 30.33 kg/m  General: no acute distress, appears happy Psych:  Alert and oriented, normal mood and affect HEENT:  Normocephalic, atraumatic, supple neck  Cardiovascular:  RRR without murmur. no edema Respiratory:  Good breath sounds bilaterally, CTAB with normal respiratory effort Skin:  Warm, no rashes Neurologic:   Mental status is normal, no tremor  Commons side effects, risks, benefits, and alternatives for medications and treatment plan prescribed today were discussed, and the patient expressed understanding of  the given instructions. Patient is instructed to call or message via MyChart if he/she has any questions or concerns regarding our treatment plan. No barriers to understanding were identified. We discussed Red Flag symptoms and signs in detail. Patient expressed understanding regarding what to do in case of urgent or emergency type symptoms.   Medication list was reconciled, printed and provided to the patient in AVS. Patient instructions and summary information was reviewed with the patient as documented in the AVS. This note was prepared with assistance of Dragon voice recognition software. Occasional wrong-word or sound-a-like substitutions may have occurred due to the inherent limitations of voice recognition software

## 2017-11-23 NOTE — Patient Instructions (Addendum)
Please return in May 2020 for your annual complete physical; please come fasting. May schedule AWV in May as well.   I'll let you know about your thyroid test results.   If you have any questions or concerns, please don't hesitate to send me a message via MyChart or call the office at [phone]. Thank you for visiting with Alexis Banks today! It's our pleasure caring for you.  Go get your shingrix vaccination.

## 2017-11-24 ENCOUNTER — Other Ambulatory Visit: Payer: Self-pay | Admitting: Emergency Medicine

## 2017-11-24 MED ORDER — LEVOTHYROXINE SODIUM 125 MCG PO TABS: 125.0000 ug | ORAL_TABLET | Freq: Every day | ORAL | 3 refills | Status: DC

## 2017-11-24 NOTE — Progress Notes (Signed)
Please call patient: I have reviewed his/her lab results. Thyroid is good again. Continue same does of thyroid medication. You may refill if needed. thanks

## 2017-11-28 ENCOUNTER — Telehealth: Payer: Self-pay | Admitting: Emergency Medicine

## 2017-11-28 NOTE — Telephone Encounter (Signed)
Copied from [place] [id number]. Topic: General - Other >> Nov 28, 2017  9:02 AM [name] wrote: Patient is calling in stating she needs a PA in order to get  Zoster Vaccine Adjuvanted [facility]) injection  Spoke with Patient and she states insurance states we have to do Prior Authorization in order for her to get Shingles Vaccine.   [name],  LPN

## 2017-11-28 NOTE — Telephone Encounter (Signed)
PA Approved for Shingles Vaccine. Approved through 05/29/2018

## 2017-11-28 NOTE — Telephone Encounter (Signed)
Patient informed of shringix PA completed. She can go at anytime to get vaccine. Patient verbalized understanding.

## 2017-11-28 NOTE — Telephone Encounter (Signed)
LM to  RC, CRM Created  

## 2017-11-28 NOTE — Telephone Encounter (Signed)
PA Authorization started 11/28/2017 via Cover my Meds, Awaiting Response.   PA Case ID: [id number]

## 2017-12-19 ENCOUNTER — Other Ambulatory Visit: Payer: Self-pay | Admitting: Emergency Medicine

## 2017-12-19 MED ORDER — PROPRANOLOL HCL 20 MG PO TABS: 10.0000 mg | ORAL_TABLET | Freq: Two times a day (BID) | ORAL | 3 refills | Status: DC

## 2017-12-19 MED ORDER — BENAZEPRIL HCL 20 MG PO TABS: 20.0000 mg | ORAL_TABLET | Freq: Every day | ORAL | 3 refills | Status: DC

## 2017-12-19 MED ORDER — LOVASTATIN 40 MG PO TABS: 40.0000 mg | ORAL_TABLET | Freq: Every day | ORAL | 3 refills | Status: DC

## 2017-12-23 MED FILL — ZOLPIDEM TARTRATE 10 MG TAB: 10 | 30 days supply | Qty: 30 | Fill #2

## 2018-01-24 MED FILL — ZOLPIDEM TARTRATE 10 MG TAB: 10 | 30 days supply | Qty: 30 | Fill #3

## 2018-01-30 DIAGNOSIS — H524 Presbyopia: Secondary | ICD-10-CM | POA: Diagnosis not present

## 2018-01-30 DIAGNOSIS — H2513 Age-related nuclear cataract, bilateral: Secondary | ICD-10-CM | POA: Diagnosis not present

## 2018-02-20 ENCOUNTER — Other Ambulatory Visit: Payer: Self-pay | Admitting: Family Medicine

## 2018-02-20 MED FILL — ZOLPIDEM TARTRATE 10 MG TAB: 10 | 30 days supply | Qty: 30 | Fill #0

## 2018-02-20 NOTE — Telephone Encounter (Signed)
Last refill: 10/13/17 #30, 3 Last OV: 11/23/17

## 2018-03-22 MED FILL — ZOLPIDEM TARTRATE 10 MG TAB: 10 | 30 days supply | Qty: 30 | Fill #1

## 2018-03-23 DIAGNOSIS — H57053 Tonic pupil, bilateral: Secondary | ICD-10-CM | POA: Diagnosis not present

## 2018-05-09 MED FILL — ZOLPIDEM TARTRATE 10 MG TAB: 10 | 30 days supply | Qty: 30 | Fill #2

## 2018-05-31 ENCOUNTER — Encounter: Payer: Medicare Other | Admitting: Family Medicine

## 2018-05-31 ENCOUNTER — Ambulatory Visit: Payer: Medicare Other

## 2018-06-01 ENCOUNTER — Ambulatory Visit: Payer: Medicare Other

## 2018-06-01 ENCOUNTER — Encounter: Payer: Medicare Other | Admitting: Family Medicine

## 2018-06-05 MED FILL — ZOLPIDEM TARTRATE 10 MG TAB: 10 | 30 days supply | Qty: 30 | Fill #3

## 2018-06-19 ENCOUNTER — Other Ambulatory Visit: Payer: Self-pay | Admitting: Family Medicine

## 2018-06-19 ENCOUNTER — Other Ambulatory Visit: Payer: Self-pay | Admitting: *Deleted

## 2018-06-19 MED ORDER — PROPRANOLOL HCL 20 MG PO TABS: 20.0000 mg | ORAL_TABLET | Freq: Two times a day (BID) | ORAL | 1 refills | Status: DC

## 2018-06-19 MED ORDER — PROPRANOLOL HCL 20 MG PO TABS: 10.0000 mg | ORAL_TABLET | Freq: Two times a day (BID) | ORAL | 0 refills | Status: DC

## 2018-06-19 NOTE — Telephone Encounter (Signed)
See note

## 2018-06-19 NOTE — Telephone Encounter (Signed)
Copied from [place] [id number]. Topic: Quick Communication - Rx Refill/Question >> Jun 19, 2018  2:44 PM Burchel, Abbi R wrote: Medication: propranolol (INDERAL) 20 MG tablet  Pt states that due to increase in dosage, she will run out of this medication before her regular shipment.  Please send refills early.   Preferred Pharmacy: [facility], [place] [phone] (Phone) [phone] (Fax)    Agent: Please be advised that RX refills may take up to 3 business days. We ask that you follow-up with your pharmacy.

## 2018-06-19 NOTE — Telephone Encounter (Signed)
Pt aware RX sent 

## 2018-07-03 ENCOUNTER — Other Ambulatory Visit: Payer: Self-pay | Admitting: Family Medicine

## 2018-07-03 MED FILL — ZOLPIDEM TARTRATE 10 MG TAB: 10 | 30 days supply | Qty: 30 | Fill #0

## 2018-07-31 ENCOUNTER — Telehealth: Payer: Self-pay | Admitting: Family Medicine

## 2018-07-31 NOTE — Telephone Encounter (Signed)
Patient is calling to request for the 08/07/2018 to be virutal. Patient's husband his COPD, and is terrified that the patient will bring the COVID virus back into the home. Please advise CB- [phone]

## 2018-07-31 NOTE — Telephone Encounter (Signed)
Please get virtual visit set up

## 2018-08-04 MED FILL — ZOLPIDEM TARTRATE 10 MG TAB: 10 | 30 days supply | Qty: 30 | Fill #1

## 2018-08-07 ENCOUNTER — Encounter: Payer: Medicare Other | Admitting: Family Medicine

## 2018-09-04 MED FILL — ZOLPIDEM TARTRATE 10 MG TAB: 10 | 30 days supply | Qty: 30 | Fill #2

## 2018-09-18 ENCOUNTER — Other Ambulatory Visit: Payer: Self-pay | Admitting: Family Medicine

## 2018-10-03 MED FILL — ZOLPIDEM TARTRATE 10 MG TAB: 10 | 30 days supply | Qty: 30 | Fill #3

## 2018-10-05 ENCOUNTER — Encounter: Payer: Medicare Other | Admitting: Family Medicine

## 2018-10-12 ENCOUNTER — Telehealth: Payer: Self-pay | Admitting: Family Medicine

## 2018-10-12 NOTE — Telephone Encounter (Signed)
I called the patient to schedule AWV with Alexis Banks, but she declined. VDM (Dee-Dee)

## 2018-10-12 NOTE — Telephone Encounter (Signed)
Error - duplicate

## 2018-10-13 ENCOUNTER — Other Ambulatory Visit: Payer: Self-pay

## 2018-10-13 ENCOUNTER — Ambulatory Visit (INDEPENDENT_AMBULATORY_CARE_PROVIDER_SITE_OTHER): Payer: Medicare Other

## 2018-10-13 ENCOUNTER — Encounter (INDEPENDENT_AMBULATORY_CARE_PROVIDER_SITE_OTHER): Payer: Self-pay

## 2018-10-13 DIAGNOSIS — Z0000 Encounter for general adult medical examination without abnormal findings: Secondary | ICD-10-CM

## 2018-10-13 NOTE — Progress Notes (Signed)
I have reviewed the documentation from the recent AWV done by Courtney Slade, RN; I agree with the documentation and will follow up on any recommendations or abnormal findings as suggested.  

## 2018-10-13 NOTE — Patient Instructions (Signed)
Alexis Banks , Thank you for taking time to come for your Medicare Wellness Visit. I appreciate your ongoing commitment to your health goals. Please review the following plan we discussed and let me know if I can assist you in the future.   Screening recommendations/referrals: Colorectal Screening: completed last 09/19/13 Mammogram: last 04/28/17; holding until 04/2019 Bone Density: last 06/04/15  Vision and Dental Exams: Recommended annual ophthalmology exams for early detection of glaucoma and other disorders of the eye Recommended annual dental exams for proper oral hygiene  Vaccinations: Influenza vaccine: scheduled for 10/14/18 Pneumococcal vaccine: up to date; last 11/19/14 Tdap vaccine: recommended; Please call your insurance company to determine your out of pocket expense. You may also receive this vaccine at your local pharmacy or Health Dept. Shingles vaccine: Please call your insurance company to determine your out of pocket expense for the Shingrix vaccine. You may receive this vaccine at your local pharmacy.  Advanced directives: Please bring a copy of your POA (Power of Attorney) and/or Living Will to your next appointment.  Goals: Continue to take measures to protect your health and your loved ones.   Next appointment: Please schedule your Annual Wellness Visit with your Nurse Health Advisor in one year.  Preventive Care 74 Years and Older, Female Preventive care refers to lifestyle choices and visits with your health care provider that can promote health and wellness. What does preventive care include?  A yearly physical exam. This is also called an annual well check.  Dental exams once or twice a year.  Routine eye exams. Ask your health care provider how often you should have your eyes checked.  Personal lifestyle choices, including:  Daily care of your teeth and gums.  Regular physical activity.  Eating a healthy diet.  Avoiding tobacco and drug use.  Limiting  alcohol use.  Practicing safe sex.  Taking low-dose aspirin every day if recommended by your health care provider.  Taking vitamin and mineral supplements as recommended by your health care provider. What happens during an annual well check? The services and screenings done by your health care provider during your annual well check will depend on your age, overall health, lifestyle risk factors, and family history of disease. Counseling  Your health care provider may ask you questions about your:  Alcohol use.  Tobacco use.  Drug use.  Emotional well-being.  Home and relationship well-being.  Sexual activity.  Eating habits.  History of falls.  Memory and ability to understand (cognition).  Work and work Statistician.  Reproductive health. Screening  You may have the following tests or measurements:  Height, weight, and BMI.  Blood pressure.  Lipid and cholesterol levels. These may be checked every 5 years, or more frequently if you are over 30 years old.  Skin check.  Lung cancer screening. You may have this screening every year starting at age 69 if you have a 30-pack-year history of smoking and currently smoke or have quit within the past 15 years.  Fecal occult blood test (FOBT) of the stool. You may have this test every year starting at age 6.  Flexible sigmoidoscopy or colonoscopy. You may have a sigmoidoscopy every 5 years or a colonoscopy every 10 years starting at age 19.  Hepatitis C blood test.  Hepatitis B blood test.  Sexually transmitted disease (STD) testing.  Diabetes screening. This is done by checking your blood sugar (glucose) after you have not eaten for a while (fasting). You may have this done every 1-3 years.  Bone density scan. This is done to screen for osteoporosis. You may have this done starting at age 35.  Mammogram. This may be done every 1-2 years. Talk to your health care provider about how often you should have regular  mammograms. Talk with your health care provider about your test results, treatment options, and if necessary, the need for more tests. Vaccines  Your health care provider may recommend certain vaccines, such as:  Influenza vaccine. This is recommended every year.  Tetanus, diphtheria, and acellular pertussis (Tdap, Td) vaccine. You may need a Td booster every 10 years.  Zoster vaccine. You may need this after age 78.  Pneumococcal 13-valent conjugate (PCV13) vaccine. One dose is recommended after age 45.  Pneumococcal polysaccharide (PPSV23) vaccine. One dose is recommended after age 86. Talk to your health care provider about which screenings and vaccines you need and how often you need them. This information is not intended to replace advice given to you by your health care provider. Make sure you discuss any questions you have with your health care provider. Document Released: 01/31/2015 Document Revised: 09/24/2015 Document Reviewed: 11/05/2014 Elsevier Interactive Patient Education  2017 [employer] Prevention in the Home Falls can cause injuries. They can happen to people of all ages. There are many things you can do to make your home safe and to help prevent falls. What can I do on the outside of my home?  Regularly fix the edges of walkways and driveways and fix any cracks.  Remove anything that might make you trip as you walk through a door, such as a raised step or threshold.  Trim any bushes or trees on the path to your home.  Use bright outdoor lighting.  Clear any walking paths of anything that might make someone trip, such as rocks or tools.  Regularly check to see if handrails are loose or broken. Make sure that both sides of any steps have handrails.  Any raised decks and porches should have guardrails on the edges.  Have any leaves, snow, or ice cleared regularly.  Use sand or salt on walking paths during winter.  Clean up any spills in your garage  right away. This includes oil or grease spills. What can I do in the bathroom?  Use night lights.  Install grab bars by the toilet and in the tub and shower. Do not use towel bars as grab bars.  Use non-skid mats or decals in the tub or shower.  If you need to sit down in the shower, use a plastic, non-slip stool.  Keep the floor dry. Clean up any water that spills on the floor as soon as it happens.  Remove soap buildup in the tub or shower regularly.  Attach bath mats securely with double-sided non-slip rug tape.  Do not have throw rugs and other things on the floor that can make you trip. What can I do in the bedroom?  Use night lights.  Make sure that you have a light by your bed that is easy to reach.  Do not use any sheets or blankets that are too big for your bed. They should not hang down onto the floor.  Have a firm chair that has side arms. You can use this for support while you get dressed.  Do not have throw rugs and other things on the floor that can make you trip. What can I do in the kitchen?  Clean up any spills right away.  Avoid walking  on wet floors.  Keep items that you use a lot in easy-to-reach places.  If you need to reach something above you, use a strong step stool that has a grab bar.  Keep electrical cords out of the way.  Do not use floor polish or wax that makes floors slippery. If you must use wax, use non-skid floor wax.  Do not have throw rugs and other things on the floor that can make you trip. What can I do with my stairs?  Do not leave any items on the stairs.  Make sure that there are handrails on both sides of the stairs and use them. Fix handrails that are broken or loose. Make sure that handrails are as long as the stairways.  Check any carpeting to make sure that it is firmly attached to the stairs. Fix any carpet that is loose or worn.  Avoid having throw rugs at the top or bottom of the stairs. If you do have throw rugs,  attach them to the floor with carpet tape.  Make sure that you have a light switch at the top of the stairs and the bottom of the stairs. If you do not have them, ask someone to add them for you. What else can I do to help prevent falls?  Wear shoes that:  Do not have high heels.  Have rubber bottoms.  Are comfortable and fit you well.  Are closed at the toe. Do not wear sandals.  If you use a stepladder:  Make sure that it is fully opened. Do not climb a closed stepladder.  Make sure that both sides of the stepladder are locked into place.  Ask someone to hold it for you, if possible.  Clearly mark and make sure that you can see:  Any grab bars or handrails.  First and last steps.  Where the edge of each step is.  Use tools that help you move around (mobility aids) if they are needed. These include:  Canes.  Walkers.  Scooters.  Crutches.  Turn on the lights when you go into a dark area. Replace any light bulbs as soon as they burn out.  Set up your furniture so you have a clear path. Avoid moving your furniture around.  If any of your floors are uneven, fix them.  If there are any pets around you, be aware of where they are.  Review your medicines with your doctor. Some medicines can make you feel dizzy. This can increase your chance of falling. Ask your doctor what other things that you can do to help prevent falls. This information is not intended to replace advice given to you by your health care provider. Make sure you discuss any questions you have with your health care provider. Document Released: 10/31/2008 Document Revised: 06/12/2015 Document Reviewed: 02/08/2014 Elsevier Interactive Patient Education  2017 [employer].

## 2018-10-13 NOTE — Progress Notes (Signed)
This visit is being conducted via phone call due to the COVID-19 pandemic. This patient has given me verbal consent via phone to conduct this visit, patient states they are participating from their home address. Some vital signs may be absent or patient reported.   Patient identification: identified by name, DOB, and current address.   Subjective:   Alexis Banks is a 73 y.o. female who presents for Medicare Annual (Subsequent) preventive examination.  Review of Systems:   Cardiac Risk Factors include: advanced age (>27men, >65 women);hypertension;dyslipidemia     Objective:     Vitals: There were no vitals taken for this visit.  There is no height or weight on file to calculate BMI.  Advanced Directives 10/13/2018 05/26/2017 01/21/2014 09/19/2013 12/25/2011  Does Patient Have a Medical Advance Directive? Yes Yes No No Patient does not have advance directive;Patient would not like information  Type of Advance Directive Living will;Healthcare Power of Attorney Living will;Healthcare Power of Attorney - - -  Does patient want to make changes to medical advance directive? No - Patient declined - - - -  Copy of [place] in Chart? No - copy requested No - copy requested - - -  Pre-existing out of facility DNR order (yellow form or pink MOST form) - - - - No    Tobacco Social History   Tobacco Use  Smoking Status Never Smoker  Smokeless Tobacco Never Used     Counseling given: Not Answered   Clinical Intake:  Pre-visit preparation completed: Yes  Pain : No/denies pain  Diabetes: No  How often do you need to have someone help you when you read instructions, pamphlets, or other written materials from your doctor or pharmacy?: 1 - Never  Interpreter Needed?: No  Information entered by :: Denman George LPN  Past Medical History:  Diagnosis Date  . Anemia   . ANXIETY DEPRESSION   . Common bile duct dilation   . CORONARY ARTERY DISEASE   . GERD   .  Hyperlipidemia   . Hypertension   . HYPOTHYROIDISM   . INSOMNIA   . ROTATOR CUFF SYNDROME, RIGHT   . Trochanteric bursitis of left hip   . VARICOSE VEINS, LOWER EXTREMITIES   . VITAMIN D DEFICIENCY    Past Surgical History:  Procedure Laterality Date  . CHOLECYSTECTOMY    . Rotator cuff surgery     Family History  Problem Relation Age of Onset  . COPD Father   . Alcohol abuse Mother   . Alzheimer's disease Sister   . Heart disease Neg Hx    Social History   Socioeconomic History  . Marital status: Married    Spouse name: Not on file  . Number of children: 3  . Years of education: Not on file  . Highest education level: Not on file  Occupational History    Employer: RETIRED    Comment: Retired  Scientific laboratory technician  . Financial resource strain: Not on file  . Food insecurity    Worry: Not on file    Inability: Not on file  . Transportation needs    Medical: Not on file    Non-medical: Not on file  Tobacco Use  . Smoking status: Never Smoker  . Smokeless tobacco: Never Used  Substance and Sexual Activity  . Alcohol use: No  . Drug use: No  . Sexual activity: Never  Lifestyle  . Physical activity    Days per week: Not on file  Minutes per session: Not on file  . Stress: Not on file  Relationships  . Social Herbalist on phone: Not on file    Gets together: Not on file    Attends religious service: Not on file    Active member of club or organization: Not on file    Attends meetings of clubs or organizations: Not on file    Relationship status: Not on file  Other Topics Concern  . Not on file  Social History Narrative  . Not on file    Outpatient Encounter Medications as of 10/13/2018  Medication Sig  . aspirin 81 MG tablet Take 81 mg by mouth daily.    . benazepril (LOTENSIN) 20 MG tablet TAKE 1 TABLET BY MOUTH  DAILY  . Calcium Carb-Cholecalciferol (CALCIUM-VITAMIN D3) 600-500 MG-UNIT CAPS Take 1 capsule by mouth daily.  Marland Kitchen levothyroxine  (SYNTHROID) 125 MCG tablet TAKE 1 TABLET BY MOUTH  DAILY BEFORE BREAKFAST  . lovastatin (MEVACOR) 40 MG tablet TAKE 1 TABLET BY MOUTH AT  BEDTIME  . Multiple Vitamin (MULTIVITAMIN) tablet Take 1 tablet by mouth daily.    . nitroGLYCERIN (NITROSTAT) 0.4 MG SL tablet Place 1 tablet (0.4 mg total) under the tongue every 5 (five) minutes as needed for chest pain.  Marland Kitchen omeprazole (PRILOSEC) 40 MG capsule Take 1 capsule (40 mg total) by mouth daily as needed.  . propranolol (INDERAL) 20 MG tablet Take 1 tablet (20 mg total) by mouth 2 (two) times daily.  Marland Kitchen zolpidem (AMBIEN) 10 MG tablet TAKE 1 TABLET BY MOUTH AT BEDTIME AS NEEDED FOR SLEEP  . [DISCONTINUED] colestipol (COLESTID) 1 G tablet Take 1 g by mouth daily.     No facility-administered encounter medications on file as of 10/13/2018.     Activities of Daily Living In your present state of health, do you have any difficulty performing the following activities: 10/13/2018  Hearing? Y  Comment without hearing aids  Vision? N  Difficulty concentrating or making decisions? N  Walking or climbing stairs? N  Dressing or bathing? N  Doing errands, shopping? N  Preparing Food and eating ? N  Using the Toilet? N  In the past six months, have you accidently leaked urine? N  Do you have problems with loss of bowel control? N  Managing your Medications? N  Managing your Finances? N  Housekeeping or managing your Housekeeping? N  Some recent data might be hidden    Patient Care Team: Leamon Arnt, MD as PCP - General (Family Medicine) Lafayette Dragon, MD (Inactive) as Consulting Physician (Gastroenterology) Stanford Breed Denice Bors, MD as Consulting Physician (Cardiology) Regal, Tamala Fothergill, DPM as Consulting Physician (Podiatry) Latanya Maudlin, MD as Consulting Physician (Orthopedic Surgery)    Assessment:   This is a routine wellness examination for [place].  Exercise Activities and Dietary recommendations Current Exercise Habits: Home exercise  routine, Type of exercise: walking, Time (Minutes): 45, Frequency (Times/Week): 7, Weekly Exercise (Minutes/Week): 315, Intensity: Mild  Goals    . Patient Stated     Continue to stay healthy.     . Weight (lb) < 165 lb (74.8 kg)     Lose weight by increasing activity.        Fall Risk Fall Risk  10/13/2018 05/26/2017 05/26/2017 03/25/2017 12/23/2016  Falls in the past year? 0 No No No No  Number falls in past yr: 0 - - - -  Injury with Fall? 0 - - - -  Follow up  Education provided;Falls prevention discussed;Falls evaluation completed - - - -   Is the patient's home free of loose throw rugs in walkways, pet beds, electrical cords, etc?   yes      Grab bars in the bathroom? yes      Handrails on the stairs?   yes      Adequate lighting?   yes  Depression Screen PHQ 2/9 Scores 10/13/2018 05/26/2017 05/26/2017 03/25/2017  PHQ - 2 Score 0 0 0 0  PHQ- 9 Score - - 0 0     Cognitive Function-no cognitive concerns at this time   Alert? Yes         Normal Appearance? N/a  Oriented to person? Yes           Place? Yes  Time? Yes  Recall of three objects? Yes  Can perform simple calculations? Yes  Displays appropriate judgment? Yes  Can read the correct time from a watch face? Yes   MMSE - Mini Mental State Exam 05/26/2017  Orientation to time 5  Orientation to Place 5  Registration 3  Attention/ Calculation 5  Recall 1  Language- name 2 objects 2  Language- repeat 1  Language- follow 3 step command 3  Language- read & follow direction 1  Write a sentence 1  Copy design 1  Total score 28      Immunization History  Administered Date(s) Administered  . Influenza, High Dose Seasonal PF 10/07/2012, 10/15/2014, 10/15/2015, 10/26/2016, 10/13/2017  . Influenza,inj,Quad PF,6+ Mos 10/25/2013  . Pneumococcal Conjugate-13 11/19/2014  . Pneumococcal Polysaccharide-23 08/09/2011, 10/28/2011  . Tdap 07/30/2008  . Zoster 09/09/2011    Qualifies for Shingles Vaccine?Discussed and patient will  check with pharmacy for coverage.  Patient education handout provided    Screening Tests Health Maintenance  Topic Date Due  . MAMMOGRAM  04/29/2018  . DEXA SCAN  06/04/2018  . TETANUS/TDAP  07/31/2018  . INFLUENZA VACCINE  08/19/2018  . COLONOSCOPY  09/20/2023  . Hepatitis C Screening  Completed  . PNA vac Low Risk Adult  Completed    Cancer Screenings: Lung: Low Dose CT Chest recommended if Age 53-80 years, 30 pack-year currently smoking OR have quit w/in 15years. Patient does not qualify. Breast:  Up to date on Mammogram? Yes; would like to defer until 04/2019 Up to date of Bone Density/Dexa? Yes Colorectal: colonoscopy 09/19/13     Plan:  I have personally reviewed and addressed the Medicare Annual Wellness questionnaire and have noted the following in the patient's chart:  A. Medical and social history B. Use of alcohol, tobacco or illicit drugs  C. Current medications and supplements D. Functional ability and status E.  Nutritional status F.  Physical activity G. Advance directives H. List of other physicians I.  Hospitalizations, surgeries, and ER visits in previous 12 months J.  [place] such as hearing and vision if needed, cognitive and depression L. Referrals, records requested, and appointments- none   In addition, I have reviewed and discussed with patient certain preventive protocols, quality metrics, and best practice recommendations. A written personalized care plan for preventive services as well as general preventive health recommendations were provided to patient.   Signed,  Denman George, LPN  Nurse Health Advisor   Nurse Notes: No additional

## 2018-10-14 ENCOUNTER — Ambulatory Visit: Payer: Medicare Other

## 2018-10-23 ENCOUNTER — Telehealth: Payer: Self-pay | Admitting: Family Medicine

## 2018-10-23 NOTE — Telephone Encounter (Signed)
See note  Copied from [place] [phone]. Topic: General - Other >> Oct 23, 2018 11:15 AM Pauline Good wrote: Reason for CRM: pt stated she had a shingle shot in 2013 and it wasn't in her record. She wanted the [facility] Wellness nurse to know.

## 2018-10-27 ENCOUNTER — Other Ambulatory Visit: Payer: Self-pay

## 2018-10-27 MED ORDER — PROPRANOLOL HCL 20 MG PO TABS: 20.0000 mg | ORAL_TABLET | Freq: Two times a day (BID) | ORAL | 1 refills | Status: DC

## 2018-10-27 NOTE — Telephone Encounter (Signed)
Last visit virtual 10/13/2018 Last fill 06/19/2018  #180/1

## 2018-10-30 MED FILL — ZOLPIDEM TARTRATE 10 MG TAB: 10 | 30 days supply | Qty: 30 | Fill #4

## 2018-12-01 MED FILL — ZOLPIDEM TARTRATE 10 MG TAB: 10 | 30 days supply | Qty: 30 | Fill #5

## 2018-12-06 ENCOUNTER — Ambulatory Visit (INDEPENDENT_AMBULATORY_CARE_PROVIDER_SITE_OTHER): Payer: Medicare Other | Admitting: Family Medicine

## 2018-12-06 ENCOUNTER — Encounter: Payer: Self-pay | Admitting: Family Medicine

## 2018-12-06 ENCOUNTER — Other Ambulatory Visit: Payer: Self-pay

## 2018-12-06 VITALS — BP 124/78 | HR 58 | Temp 97.7°F | Ht 63.0 in | Wt 182.0 lb

## 2018-12-06 DIAGNOSIS — I10 Essential (primary) hypertension: Secondary | ICD-10-CM | POA: Diagnosis not present

## 2018-12-06 DIAGNOSIS — E039 Hypothyroidism, unspecified: Secondary | ICD-10-CM | POA: Diagnosis not present

## 2018-12-06 DIAGNOSIS — M8580 Other specified disorders of bone density and structure, unspecified site: Secondary | ICD-10-CM | POA: Diagnosis not present

## 2018-12-06 DIAGNOSIS — Z1231 Encounter for screening mammogram for malignant neoplasm of breast: Secondary | ICD-10-CM

## 2018-12-06 DIAGNOSIS — E2839 Other primary ovarian failure: Secondary | ICD-10-CM | POA: Diagnosis not present

## 2018-12-06 DIAGNOSIS — E782 Mixed hyperlipidemia: Secondary | ICD-10-CM

## 2018-12-06 DIAGNOSIS — Z0000 Encounter for general adult medical examination without abnormal findings: Secondary | ICD-10-CM | POA: Diagnosis not present

## 2018-12-06 DIAGNOSIS — F5101 Primary insomnia: Secondary | ICD-10-CM

## 2018-12-06 LAB — CBC WITH DIFFERENTIAL/PLATELET
Basophils Absolute: 0 10*3/uL (ref 0.0–0.1)
Basophils Relative: 0.6 % (ref 0.0–3.0)
Eosinophils Absolute: 0.2 10*3/uL (ref 0.0–0.7)
Eosinophils Relative: 2.5 % (ref 0.0–5.0)
HCT: 40 % (ref 36.0–46.0)
Hemoglobin: 13 g/dL (ref 12.0–15.0)
Lymphocytes Relative: 41.2 % (ref 12.0–46.0)
Lymphs Abs: 2.5 10*3/uL (ref 0.7–4.0)
MCHC: 32.6 g/dL (ref 30.0–36.0)
MCV: 90.9 fl (ref 78.0–100.0)
Monocytes Absolute: 0.5 10*3/uL (ref 0.1–1.0)
Monocytes Relative: 7.6 % (ref 3.0–12.0)
Neutro Abs: 2.9 10*3/uL (ref 1.4–7.7)
Neutrophils Relative %: 48.1 % (ref 43.0–77.0)
Platelets: 263 10*3/uL (ref 150.0–400.0)
RBC: 4.4 Mil/uL (ref 3.87–5.11)
RDW: 14.8 % (ref 11.5–15.5)
WBC: 6 10*3/uL (ref 4.0–10.5)

## 2018-12-06 LAB — COMPREHENSIVE METABOLIC PANEL
ALT: 15 U/L (ref 0–35)
AST: 21 U/L (ref 0–37)
Albumin: 4.5 g/dL (ref 3.5–5.2)
Alkaline Phosphatase: 86 U/L (ref 39–117)
BUN: 16 mg/dL (ref 6–23)
CO2: 25 mEq/L (ref 19–32)
Calcium: 10 mg/dL (ref 8.4–10.5)
Chloride: 106 mEq/L (ref 96–112)
Creatinine, Ser: 0.85 mg/dL (ref 0.40–1.20)
GFR: 65.24 mL/min (ref >60.00)
Glucose, Bld: 106 mg/dL — ABNORMAL HIGH (ref 70–99)
Potassium: 4.5 mEq/L (ref 3.5–5.1)
Sodium: 142 mEq/L (ref 135–145)
Total Bilirubin: 0.7 mg/dL (ref 0.2–1.2)
Total Protein: 7.4 g/dL (ref 6.0–8.3)

## 2018-12-06 LAB — LIPID PANEL
Cholesterol: 140 mg/dL (ref 0–200)
HDL: 52.8 mg/dL (ref >39.00)
LDL Cholesterol: 52 mg/dL (ref 0–99)
NonHDL: 86.95
Total CHOL/HDL Ratio: 3
Triglycerides: 175 mg/dL — ABNORMAL HIGH (ref 0.0–149.0)
VLDL: 35 mg/dL (ref 0.0–40.0)

## 2018-12-06 LAB — TSH: TSH: 7.81 u[IU]/mL — ABNORMAL HIGH (ref 0.35–4.50)

## 2018-12-06 MED ORDER — PROPRANOLOL HCL 20 MG PO TABS: 20.0000 mg | ORAL_TABLET | Freq: Two times a day (BID) | ORAL | 3 refills | Status: DC

## 2018-12-06 MED ORDER — ZOLPIDEM TARTRATE 10 MG PO TABS: 10.0000 mg | ORAL_TABLET | Freq: Every evening | ORAL | 5 refills | Status: DC | PRN

## 2018-12-06 MED ORDER — SHINGRIX 50 MCG/0.5ML IM SUSR: 0.5000 mL | Freq: Once | INTRAMUSCULAR | 0 refills | Status: AC

## 2018-12-06 MED ORDER — BENAZEPRIL HCL 20 MG PO TABS: 20.0000 mg | ORAL_TABLET | Freq: Every day | ORAL | 3 refills | Status: DC

## 2018-12-06 NOTE — Patient Instructions (Addendum)
Please return in 6 months for blood pressure follow up.   Please take the prescription for Shingrix to the pharmacy so they may administer the vaccinations. Your insurance will then cover the injections.   I will refill your cholesterol and thyroid medications once the labs returned. Your other medications were sent in today.   If you have any questions or concerns, please don't hesitate to send me a message via MyChart or call the office at [phone]. Thank you for visiting with Alexis Banks today! It's our pleasure caring for you.   Preventive Care 28 Years and Older, Female Preventive care refers to lifestyle choices and visits with your health care provider that can promote health and wellness. This includes:  A yearly physical exam. This is also called an annual well check.  Regular dental and eye exams.  Immunizations.  Screening for certain conditions.  Healthy lifestyle choices, such as diet and exercise. What can I expect for my preventive care visit? Physical exam Your health care provider will check:  Height and weight. These may be used to calculate body mass index (BMI), which is a measurement that tells if you are at a healthy weight.  Heart rate and blood pressure.  Your skin for abnormal spots. Counseling Your health care provider may ask you questions about:  Alcohol, tobacco, and drug use.  Emotional well-being.  Home and relationship well-being.  Sexual activity.  Eating habits.  History of falls.  Memory and ability to understand (cognition).  Work and work Statistician.  Pregnancy and menstrual history. What immunizations do I need?  Influenza (flu) vaccine  This is recommended every year. Tetanus, diphtheria, and pertussis (Tdap) vaccine  You may need a Td booster every 10 years. Varicella (chickenpox) vaccine  You may need this vaccine if you have not already been vaccinated. Zoster (shingles) vaccine  You may need this after age  74. Pneumococcal conjugate (PCV13) vaccine  One dose is recommended after age 74. Pneumococcal polysaccharide (PPSV23) vaccine  One dose is recommended after age 74. Measles, mumps, and rubella (MMR) vaccine  You may need at least one dose of MMR if you were born in 1957 or later. You may also need a second dose. Meningococcal conjugate (MenACWY) vaccine  You may need this if you have certain conditions. Hepatitis A vaccine  You may need this if you have certain conditions or if you travel or work in places where you may be exposed to hepatitis A. Hepatitis B vaccine  You may need this if you have certain conditions or if you travel or work in places where you may be exposed to hepatitis B. Haemophilus influenzae type b (Hib) vaccine  You may need this if you have certain conditions. You may receive vaccines as individual doses or as more than one vaccine together in one shot (combination vaccines). Talk with your health care provider about the risks and benefits of combination vaccines. What tests do I need? Blood tests  Lipid and cholesterol levels. These may be checked every 5 years, or more frequently depending on your overall health.  Hepatitis C test.  Hepatitis B test. Screening  Lung cancer screening. You may have this screening every year starting at age 74 if you have a 30-pack-year history of smoking and currently smoke or have quit within the past 15 years.  Colorectal cancer screening. All adults should have this screening starting at age 74 and continuing until age 31. Your health care provider may recommend screening at age 79 if  you are at increased risk. You will have tests every 1-10 years, depending on your results and the type of screening test.  Diabetes screening. This is done by checking your blood sugar (glucose) after you have not eaten for a while (fasting). You may have this done every 1-3 years.  Mammogram. This may be done every 1-2 years. Talk with  your health care provider about how often you should have regular mammograms.  BRCA-related cancer screening. This may be done if you have a family history of breast, ovarian, tubal, or peritoneal cancers. Other tests  Sexually transmitted disease (STD) testing.  Bone density scan. This is done to screen for osteoporosis. You may have this done starting at age 74. Follow these instructions at home: Eating and drinking  Eat a diet that includes fresh fruits and vegetables, whole grains, lean protein, and low-fat dairy products. Limit your intake of foods with high amounts of sugar, saturated fats, and salt.  Take vitamin and mineral supplements as recommended by your health care provider.  Do not drink alcohol if your health care provider tells you not to drink.  If you drink alcohol: ? Limit how much you have to 0-1 drink a day. ? Be aware of how much alcohol is in your drink. In the [place] one drink equals one 12 oz bottle of beer (355 mL), one 5 oz glass of wine (148 mL), or one 1 oz glass of hard liquor (44 mL). Lifestyle  Take daily care of your teeth and gums.  Stay active. Exercise for at least 30 minutes on 5 or more days each week.  Do not use any products that contain nicotine or tobacco, such as cigarettes, e-cigarettes, and chewing tobacco. If you need help quitting, ask your health care provider.  If you are sexually active, practice safe sex. Use a condom or other form of protection in order to prevent STIs (sexually transmitted infections).  Talk with your health care provider about taking a low-dose aspirin or statin. What's next?  Go to your health care provider once a year for a well check visit.  Ask your health care provider how often you should have your eyes and teeth checked.  Stay up to date on all vaccines. This information is not intended to replace advice given to you by your health care provider. Make sure you discuss any questions you have with your  health care provider. Document Released: 01/31/2015 Document Revised: 12/29/2017 Document Reviewed: 12/29/2017 Elsevier Patient Education  2020 [employer].

## 2018-12-06 NOTE — Progress Notes (Signed)
Subjective  Chief Complaint  Patient presents with   Annual Exam    HPI: Alexis Banks is a 73 y.o. female who presents to [facility] [place] today for a Female Wellness Visit. She also has the concerns and/or needs as listed above in the chief complaint. These will be addressed in addition to the Health Maintenance Visit.   Wellness Visit: annual visit with health maintenance review and exam without Pap   HM: reviewed recent AWV. Doing great. Staying home mostly due to covid. No mood problems. Due for mammo and dexa; BC canceled her appt in June and never rescheduled her. Due shingrix Chronic disease f/u and/or acute problem visit: (deemed necessary to be done in addition to the wellness visit):  HTN: Feeling well. Taking medications w/o adverse effects. No symptoms of CHF, angina; no palpitations, sob, cp or lower extremity edema. Compliant with meds.   Low thyroid: feels well. Good energy. No sxs of high or low thyroid endorsed. Had been well controlled. Compliant with meds. Due for recheck today.   HLD: fasting today on statin with good tolerance and no AEs.  Insomnia: persists. Uses ambien prn. Needs refill   Assessment  1. Annual physical exam   2. Acquired hypothyroidism   3. Mixed hyperlipidemia   4. Essential hypertension   5. Osteopenia, unspecified location   6. Primary insomnia   7. Encounter for screening mammogram for breast cancer   8. Hypoestrogenism      Plan  Female Wellness Visit:  Age appropriate Health Maintenance and Prevention measures were discussed with patient. Included topics are cancer screening recommendations, ways to keep healthy (see AVS) including dietary and exercise recommendations, regular eye and dental care, use of seat belts, and avoidance of moderate alcohol use and tobacco use. mammo and dexa ordered  BMI: discussed patient's BMI and encouraged positive lifestyle modifications to help get to or maintain a target  BMI.  HM needs and immunizations were addressed and ordered. See below for orders. See HM and immunization section for updates. Sent with RX for shingrix  Routine labs and screening tests ordered including cmp, cbc and lipids where appropriate.  Discussed recommendations regarding Vit D and calcium supplementation (see AVS)  Chronic disease management visit and/or acute problem visit:  HTN: well controlled. Check renal function and electrolytes. Continue same meds.   HLD: on statin and fasting for lipid panel and lfts.   Euthyroid clinically; recheck today and refill   Insomnia: continue ambien. Reviewed drug database and it is appropriate  Follow up: Return in about 6 months (around 06/05/2019) for follow up Hypertension.  Orders Placed This Encounter  Procedures   mammo BC   Dexa BC   CBC w/Diff   CMP   Lipids   TSH   Meds ordered this encounter  Medications   benazepril (LOTENSIN) 20 MG tablet    Sig: Take 1 tablet (20 mg total) by mouth daily.    Dispense:  90 tablet    Refill:  3    Requesting 1 year supply   propranolol (INDERAL) 20 MG tablet    Sig: Take 1 tablet (20 mg total) by mouth 2 (two) times daily.    Dispense:  180 tablet    Refill:  3   zolpidem (AMBIEN) 10 MG tablet    Sig: Take 1 tablet (10 mg total) by mouth at bedtime as needed. for sleep    Dispense:  30 tablet    Refill:  5  Zoster Vaccine Adjuvanted [facility]) injection    Sig: Inject 0.5 mLs into the muscle once for 1 dose. Please give 2nd dose 2-6 months after first dose    Dispense:  2 each    Refill:  0      Lifestyle: Body mass index is 32.24 kg/m. Wt Readings from Last 3 Encounters:  12/06/18 182 lb (82.6 kg)  11/23/17 171 lb 3.2 oz (77.7 kg)  10/13/17 167 lb 6.4 oz (75.9 kg)     Patient Active Problem List   Diagnosis Date Noted   Obesity (BMI 30-39.9) 11/10/2012    Priority: High   Hypothyroidism 01/09/2007    Priority: High    Qualifier: Diagnosis of  By:  Burnice Logan  MD, Doretha Sou     Hyperlipidemia 01/09/2007    Priority: High    Qualifier: Diagnosis of  By: Burnice Logan  MD, Doretha Sou     Essential hypertension 01/09/2007    Priority: High    Qualifier: Diagnosis of  By: Burnice Logan  MD, Doretha Sou     Irritable bowel syndrome with diarrhea 06/17/2015    Priority: Medium   Osteopenia 09/10/2014    Priority: Medium   OAB (overactive bladder) 02/24/2012    Priority: Medium   GERD 03/27/2009    Priority: Medium    Qualifier: Diagnosis of  By: Niel Hummer MD, Lorinda Creed     Primary insomnia 03/02/2007    Priority: Medium    Qualifier: Diagnosis of  By: Niel Hummer MD, Willie R     Adie's tonic pupil, 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    Priority: Low   Uses hearing aid 12/22/2015    Priority: Low   Health Maintenance  Topic Date Due   MAMMOGRAM  04/29/2018   DEXA SCAN  06/04/2018   TETANUS/TDAP  07/31/2018   COLONOSCOPY  09/20/2023   INFLUENZA VACCINE  Completed   Hepatitis C Screening  Completed   PNA vac Low Risk Adult  Completed   Immunization History  Administered Date(s) Administered   Influenza, High Dose Seasonal PF 10/07/2012, 10/15/2014, 10/15/2015, 10/26/2016, 10/13/2017   Influenza,inj,Quad PF,6+ Mos 10/25/2013   Influenza-Unspecified 10/14/2018   Pneumococcal Conjugate-13 11/19/2014   Pneumococcal Polysaccharide-23 08/09/2011, 10/28/2011   Tdap 07/30/2008   Zoster 09/09/2011   We updated and reviewed the patient's past history in detail and it is documented below. Allergies: Patient is allergic to adhesive [tape] and other. Past Medical History Patient  has a past medical history of Anemia, ANXIETY DEPRESSION, Common bile duct dilation, CORONARY ARTERY DISEASE, GERD, Hyperlipidemia, Hypertension, HYPOTHYROIDISM, INSOMNIA, ROTATOR CUFF SYNDROME, RIGHT, Trochanteric bursitis of left hip, VARICOSE VEINS, LOWER EXTREMITIES, and VITAMIN D DEFICIENCY. Past Surgical History Patient  has a past surgical  history that includes Cholecystectomy and Rotator cuff surgery. Family History: Patient family history includes Alcohol abuse in her mother; Alzheimer's disease in her sister; COPD in her father. Social History:  Patient  reports that she has never smoked. She has never used smokeless tobacco. She reports that she does not drink alcohol or use drugs.  Review of Systems: Constitutional: negative for fever or malaise Ophthalmic: negative for photophobia, double vision or loss of vision Cardiovascular: negative for chest pain, dyspnea on exertion, or new LE swelling Respiratory: negative for SOB or persistent cough Gastrointestinal: negative for abdominal pain, change in bowel habits or melena Genitourinary: negative for dysuria or gross hematuria, no abnormal uterine bleeding or disharge Musculoskeletal: negative for new gait disturbance or muscular weakness Integumentary: negative for new or persistent rashes, no breast  lumps Neurological: negative for TIA or stroke symptoms Psychiatric: negative for SI or delusions Allergic/Immunologic: negative for hives  Patient Care Team    Relationship Specialty Notifications Start End  Leamon Arnt, MD PCP - General Family Medicine  12/23/16   Lafayette Dragon, MD (Inactive) Consulting Physician Gastroenterology  07/30/13   Lelon Perla, MD Consulting Physician Cardiology  07/30/13   Wallene Huh, DPM Consulting Physician Podiatry  05/26/17   Latanya Maudlin, MD Consulting Physician Orthopedic Surgery  05/26/17   Jola Schmidt, MD Consulting Physician Ophthalmology  10/13/18   Inc, [place] Physician Audiology  10/13/18     Objective  Vitals: BP 124/78 (BP Location: Left Arm, Patient Position: Sitting, Cuff Size: Normal)    Pulse (!) 58    Temp 97.7 F (36.5 C) (Temporal)    Ht 5\' 3"  (1.6 m)    Wt 182 lb (82.6 kg)    SpO2 97%    BMI 32.24 kg/m  General:  Well developed, well nourished, no acute distress  Psych:   Alert and orientedx3,normal mood and affect HEENT:  Normocephalic, atraumatic, non-icteric sclera, PERRL, oropharynx is clear without mass or exudate, supple neck without adenopathy, mass or thyromegaly Cardiovascular:  Normal S1, S2, RRR without gallop, rub or murmur, nondisplaced PMI Respiratory:  Good breath sounds bilaterally, CTAB with normal respiratory effort Gastrointestinal: normal bowel sounds, soft, non-tender, no noted masses. No HSM MSK: no deformities, contusions. Joints are without erythema or swelling. Spine and CVA region are nontender Skin:  Warm, no rashes or suspicious lesions noted Neurologic:    Mental status is normal. CN 2-11 are normal. Gross motor and sensory exams are normal. Normal gait. No tremor Breast Exam: No mass, skin retraction or nipple discharge is appreciated in either breast. No axillary adenopathy. Fibrocystic changes are not noted    Commons side effects, risks, benefits, and alternatives for medications and treatment plan prescribed today were discussed, and the patient expressed understanding of the given instructions. Patient is instructed to call or message via MyChart if he/she has any questions or concerns regarding our treatment plan. No barriers to understanding were identified. We discussed Red Flag symptoms and signs in detail. Patient expressed understanding regarding what to do in case of urgent or emergency type symptoms.   Medication list was reconciled, printed and provided to the patient in AVS. Patient instructions and summary information was reviewed with the patient as documented in the AVS. This note was prepared with assistance of Dragon voice recognition software. Occasional wrong-word or sound-a-like substitutions may have occurred due to the inherent limitations of voice recognition software

## 2018-12-07 ENCOUNTER — Telehealth: Payer: Self-pay | Admitting: Family Medicine

## 2018-12-07 MED FILL — ZOLPIDEM TARTRATE 10 MG TAB: 10 | 30 days supply | Qty: 30 | Fill #0

## 2018-12-07 NOTE — Telephone Encounter (Signed)
Sandy at [facility], states that medication sent out on Friday was lost in the mail. Lovey Newcomer states that they will have to fill early and intercept the lost one. She just wanted to make PCP aware.     Medication: zolpidem (AMBIEN) 10 MG tablet      Contact number for Sandy at [facility]  [phone]

## 2018-12-07 NOTE — Telephone Encounter (Signed)
Noted  

## 2018-12-07 NOTE — Telephone Encounter (Signed)
See note

## 2018-12-07 NOTE — Progress Notes (Signed)
Please call patient: I have reviewed his/her lab results. Most results look great. Sugar is just above normal but stable and cholesterol levels look good. I have refilled her statin. However, the thyroid test is off. I recommend repeating a TSH in the next 1-2 weeks (schedule lab visit and TSH, dx hypothyroidism) to ensure it is accurate. If it remains off, I will adjust her thyroid medication up.   Thanks.

## 2018-12-08 ENCOUNTER — Other Ambulatory Visit: Payer: Self-pay

## 2018-12-08 DIAGNOSIS — E039 Hypothyroidism, unspecified: Secondary | ICD-10-CM

## 2018-12-20 ENCOUNTER — Other Ambulatory Visit (INDEPENDENT_AMBULATORY_CARE_PROVIDER_SITE_OTHER): Payer: Medicare Other

## 2018-12-20 ENCOUNTER — Other Ambulatory Visit: Payer: Self-pay

## 2018-12-20 DIAGNOSIS — E039 Hypothyroidism, unspecified: Secondary | ICD-10-CM

## 2018-12-20 LAB — TSH: TSH: 3.23 u[IU]/mL (ref 0.35–4.50)

## 2018-12-20 NOTE — Progress Notes (Signed)
Please call patient: I have reviewed his/her lab results. Thanks for returning for repeat thryoid test. Fortunately, it is now in the normal range. I will not adjust her medication; continue her current dosing. I recommend repeat OV in 3 months for thyroid recheck. Please schedule this. Thanks!

## 2019-01-10 MED FILL — ZOLPIDEM TARTRATE 10 MG TAB: 10 | 30 days supply | Qty: 30 | Fill #1

## 2019-01-22 ENCOUNTER — Encounter: Payer: Medicare Other | Admitting: Family Medicine

## 2019-02-07 MED FILL — ZOLPIDEM TARTRATE 10 MG TAB: 10 | 30 days supply | Qty: 30 | Fill #2

## 2019-02-16 ENCOUNTER — Ambulatory Visit: Payer: Medicare Other

## 2019-02-27 ENCOUNTER — Other Ambulatory Visit: Payer: Self-pay | Admitting: Family Medicine

## 2019-03-21 ENCOUNTER — Ambulatory Visit (INDEPENDENT_AMBULATORY_CARE_PROVIDER_SITE_OTHER): Payer: Medicare Other | Admitting: Family Medicine

## 2019-03-21 ENCOUNTER — Other Ambulatory Visit: Payer: Self-pay

## 2019-03-21 ENCOUNTER — Encounter: Payer: Self-pay | Admitting: Family Medicine

## 2019-03-21 VITALS — BP 122/86 | HR 56 | Temp 97.4°F | Ht 63.0 in | Wt 176.8 lb

## 2019-03-21 DIAGNOSIS — E039 Hypothyroidism, unspecified: Secondary | ICD-10-CM | POA: Diagnosis not present

## 2019-03-21 DIAGNOSIS — E782 Mixed hyperlipidemia: Secondary | ICD-10-CM | POA: Diagnosis not present

## 2019-03-21 DIAGNOSIS — I10 Essential (primary) hypertension: Secondary | ICD-10-CM | POA: Diagnosis not present

## 2019-03-21 DIAGNOSIS — F5101 Primary insomnia: Secondary | ICD-10-CM

## 2019-03-21 DIAGNOSIS — E2839 Other primary ovarian failure: Secondary | ICD-10-CM

## 2019-03-21 LAB — BASIC METABOLIC PANEL
BUN: 17 mg/dL (ref 6–23)
CO2: 28 mEq/L (ref 19–32)
Calcium: 9.8 mg/dL (ref 8.4–10.5)
Chloride: 105 mEq/L (ref 96–112)
Creatinine, Ser: 0.86 mg/dL (ref 0.40–1.20)
GFR: 64.31 mL/min (ref >60.00)
Glucose, Bld: 98 mg/dL (ref 70–99)
Potassium: 4.5 mEq/L (ref 3.5–5.1)
Sodium: 140 mEq/L (ref 135–145)

## 2019-03-21 LAB — TSH: TSH: 1.92 u[IU]/mL (ref 0.35–4.50)

## 2019-03-21 NOTE — Progress Notes (Signed)
Subjective  CC:  Chief Complaint  Patient presents with  . Hypertension    does not check readings at home. No HA, dizziness, or visual changes. Takes benazepril for bp. no side effects  . Hypothyroidism    HPI: Alexis Banks is a 74 y.o. female who presents to the office today to address the problems listed above in the chief complaint.  Hypertension f/u: Control is good . Pt reports she is doing well. taking medications as instructed, no medication side effects noted, no TIAs, no chest pain on exertion, no dyspnea on exertion, no swelling of ankles. She denies adverse effects from his BP medications. Compliance with medication is good.   Low thyroid: had an abnormal TSH last November with nl f/u in December. Reports feels well. No clinical sxs of high or low thyroid and reports good compliance with meds.  Lab Results  Component Value Date   TSH 3.23 12/20/2018    covid vaccinations completed. Tolerated well  HM: overdue for both mammo and dexa.   On chronic ambien for sleep disorder/insomnia. Stable with use . No AEs. I reviewed patient's records from the PMP aware controlled substance registry today.   HLD remains controlled on statin. Tolerating well w/o myalgias.  Assessment  1. Essential hypertension   2. Acquired hypothyroidism   3. Mixed hyperlipidemia   4. Primary insomnia   5. Hypoestrogenism      Plan    Hypertension f/u: BP control is well controlled. Continue current mgt. Check bmp on ace  Hyperlipidemia f/u: controlled  Low thyroid: recheck tsh to ensure is stable. No med changes. Clinically euthyroid  Insomnia: stable. Lorrin Mais use is chronic and appropriate. Pt aware of risk/benefit  HM: mammo and dexa recommended. Pt to schedule.  Education regarding management of these chronic disease states was given. Management strategies discussed on successive visits include dietary and exercise recommendations, goals of achieving and maintaining IBW, and lifestyle  modifications aiming for adequate sleep and minimizing stressors.   Follow up: 76months for cpe  Orders Placed This Encounter  Procedures  . DG Bone Density  . Basic metabolic panel  . TSH   No orders of the defined types were placed in this encounter.     BP Readings from Last 3 Encounters:  03/21/19 122/86  12/06/18 124/78  11/23/17 116/83   Wt Readings from Last 3 Encounters:  03/21/19 176 lb 12.8 oz (80.2 kg)  12/06/18 182 lb (82.6 kg)  11/23/17 171 lb 3.2 oz (77.7 kg)    Lab Results  Component Value Date   CHOL 140 12/06/2018   CHOL 143 05/26/2017   CHOL 145 01/31/2014   Lab Results  Component Value Date   HDL 52.80 12/06/2018   HDL 52.60 05/26/2017   HDL 53.30 01/31/2014   Lab Results  Component Value Date   LDLCALC 52 12/06/2018   LDLCALC 70 05/26/2017   LDLCALC 66 01/31/2014   Lab Results  Component Value Date   TRIG 175.0 (H) 12/06/2018   TRIG 100.0 05/26/2017   TRIG 128.0 01/31/2014   Lab Results  Component Value Date   CHOLHDL 3 12/06/2018   CHOLHDL 3 05/26/2017   CHOLHDL 3 01/31/2014   Lab Results  Component Value Date   LDLDIRECT 112.6 11/07/2008   LDLDIRECT 136.6 01/09/2007   Lab Results  Component Value Date   CREATININE 0.85 12/06/2018   BUN 16 12/06/2018   NA 142 12/06/2018   K 4.5 12/06/2018   CL 106 12/06/2018   CO2  25 12/06/2018    The 10-year ASCVD risk score [name], et al., 2013) is: 18.7%   Values used to calculate the score:     Age: 46 years     Sex: Female     Is Non-Hispanic African American: No     Diabetic: No     Tobacco smoker: No     Systolic Blood Pressure: 123XX123 mmHg     Is BP treated: Yes     HDL Cholesterol: 52.8 mg/dL     Total Cholesterol: 140 mg/dL  I reviewed the patients updated PMH, FH, and SocHx.    Patient Active Problem List   Diagnosis Date Noted  . Obesity (BMI 30-39.9) 11/10/2012    Priority: High  . Acquired hypothyroidism 01/09/2007    Priority: High  . Mixed hyperlipidemia  01/09/2007    Priority: High  . Essential hypertension 01/09/2007    Priority: High  . Irritable bowel syndrome with diarrhea 06/17/2015    Priority: Medium  . Osteopenia 09/10/2014    Priority: Medium  . OAB (overactive bladder) 02/24/2012    Priority: Medium  . GERD 03/27/2009    Priority: Medium  . Primary insomnia 03/02/2007    Priority: Medium  . 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 Uses hearing aid 12/22/2015    Priority: Low    Allergies: Adhesive [tape] and Other  Social History: Patient  reports that she has never smoked. She has never used smokeless tobacco. She reports that she does not drink alcohol or use drugs.  Current Meds  Medication Sig  . aspirin 81 MG tablet Take 81 mg by mouth daily.    . benazepril (LOTENSIN) 20 MG tablet Take 1 tablet (20 mg total) by mouth daily.  . Calcium Carb-Cholecalciferol (CALCIUM-VITAMIN D3) 600-500 MG-UNIT CAPS Take 1 capsule by mouth daily.  Marland Kitchen levothyroxine (SYNTHROID) 125 MCG tablet TAKE 1 TABLET BY MOUTH  DAILY BEFORE BREAKFAST  . lovastatin (MEVACOR) 40 MG tablet TAKE 1 TABLET BY MOUTH AT  BEDTIME  . Multiple Vitamin (MULTIVITAMIN) tablet Take 1 tablet by mouth daily.    . propranolol (INDERAL) 20 MG tablet Take 1 tablet (20 mg total) by mouth 2 (two) times daily.  . Zinc Sulfate (ZINC 15 PO) Take by mouth.  . zolpidem (AMBIEN) 10 MG tablet Take 1 tablet (10 mg total) by mouth at bedtime as needed. for sleep    Review of Systems: Cardiovascular: negative for chest pain, palpitations, leg swelling, orthopnea Respiratory: negative for SOB, wheezing or persistent cough Gastrointestinal: negative for abdominal pain Genitourinary: negative for dysuria or gross hematuria  Objective  Vitals: BP 122/86 (BP Location: Left Arm, Patient Position: Sitting, Cuff Size: Large)   Pulse (!) 56   Temp (!) 97.4 F (36.3 C) (Temporal)   Ht 5\' 3"  (1.6 m)   Wt 176 lb 12.8 oz (80.2 kg)   SpO2 97%   BMI 31.32 kg/m    General: no acute distress  Psych:  Alert and oriented, normal mood and affect HEENT:  Normocephalic, atraumatic, supple neck  Cardiovascular:  RRR without murmur. no edema Respiratory:  Good breath sounds bilaterally, CTAB with normal respiratory effort Skin:  Warm, no rashes Neurologic:   Mental status is normal, no tremor  Commons side effects, risks, benefits, and alternatives for medications and treatment plan prescribed today were discussed, and the patient expressed understanding of the given instructions. Patient is instructed to call or message via MyChart if he/she has any  questions or concerns regarding our treatment plan. No barriers to understanding were identified. We discussed Red Flag symptoms and signs in detail. Patient expressed understanding regarding what to do in case of urgent or emergency type symptoms.   Medication list was reconciled, printed and provided to the patient in AVS. Patient instructions and summary information was reviewed with the patient as documented in the AVS. This note was prepared with assistance of Dragon voice recognition software. Occasional wrong-word or sound-a-like substitutions may have occurred due to the inherent limitations of voice recognition software  This visit occurred during the SARS-CoV-2 public health emergency.  Safety protocols were in place, including screening questions prior to the visit, additional usage of staff PPE, and extensive cleaning of exam room while observing appropriate contact time as indicated for disinfecting solutions.

## 2019-03-21 NOTE — Progress Notes (Signed)
Please call patient: I have reviewed his/her lab results. Her lab test results are normal.

## 2019-03-21 NOTE — Patient Instructions (Addendum)
Please return in 6 months for your annual complete physical; please come fasting.  Please sign up for Mychart. I have texted you the link.  We will call you with your lab test results for your thyroid.  Your blood pressure looks good.  I have ordered a mammogram and/or bone density for you as we discussed today: [x]   Mammogram  [x]   Bone Density  Please call the office checked below to schedule your appointment: Your appointment will at the following location  [x]   [facility]      [place], [place]         []   [facility]  [address] [place], [place]  Make sure to wear two peace clothing  No lotions powders or deodorants the day of the appointment Make sure to bring picture ID and insurance card.  Bring list of medications you are currently taking including any supplements.   If you have any questions or concerns, please don't hesitate to send me a message via MyChart or call the office at [phone]. Thank you for visiting with Alexis Banks today! It's our pleasure caring for you.

## 2019-04-01 MED FILL — ZOLPIDEM TARTRATE 10 MG TAB: 10 | 30 days supply | Qty: 30 | Fill #3

## 2019-04-30 MED FILL — ZOLPIDEM TARTRATE 10 MG TAB: 10 | 30 days supply | Qty: 30 | Fill #4

## 2019-06-01 ENCOUNTER — Other Ambulatory Visit: Payer: Self-pay

## 2019-06-04 ENCOUNTER — Other Ambulatory Visit: Payer: Self-pay

## 2019-06-05 ENCOUNTER — Ambulatory Visit (INDEPENDENT_AMBULATORY_CARE_PROVIDER_SITE_OTHER): Payer: Medicare Other | Admitting: Family Medicine

## 2019-06-05 ENCOUNTER — Encounter: Payer: Self-pay | Admitting: Family Medicine

## 2019-06-05 VITALS — BP 148/90 | HR 56 | Temp 97.2°F | Resp 15 | Ht 63.0 in | Wt 172.0 lb

## 2019-06-05 DIAGNOSIS — Z1231 Encounter for screening mammogram for malignant neoplasm of breast: Secondary | ICD-10-CM | POA: Diagnosis not present

## 2019-06-05 DIAGNOSIS — F5101 Primary insomnia: Secondary | ICD-10-CM | POA: Diagnosis not present

## 2019-06-05 DIAGNOSIS — I10 Essential (primary) hypertension: Secondary | ICD-10-CM | POA: Diagnosis not present

## 2019-06-05 DIAGNOSIS — E2839 Other primary ovarian failure: Secondary | ICD-10-CM

## 2019-06-05 MED ORDER — ZOLPIDEM TARTRATE 10 MG PO TABS: 10.0000 mg | ORAL_TABLET | Freq: Every evening | ORAL | 5 refills | Status: DC | PRN

## 2019-06-05 NOTE — Progress Notes (Signed)
Subjective  CC:  Chief Complaint  Patient presents with  . Hypertension    denies checking BP at home, needing Ambien refilled  . Joint Swelling    both ankle started around March     HPI: Alexis Banks is a 74 y.o. female who presents to the office today to address the problems listed above in the chief complaint.  Hypertension f/u: Control is good . Pt reports she is doing well. taking medications as instructed, no medication side effects noted, no TIAs, no chest pain on exertion, no dyspnea on exertion, noting swelling of ankles. Takes BP regularly at home: can't tell me the numbers but her husband says it is fine. She has puffiness over her lateral ankle w/o pain, foot or leg swelling.  She denies adverse effects from his BP medications. Compliance with medication is good.   Primary insomnia: doing well on ambien. Has been a chronic user. Has failed other methods. No AEs. No falls  HM: overdue for mammo and dexa; has postponed due to covid. Now will consider going.   Assessment  1. Essential hypertension   2. Hypoestrogenism   3. Encounter for screening mammogram for breast cancer   4. Primary insomnia      Plan    Hypertension f/u: BP control is fairly well controlled by home readings. Admits to being anxious here today. Will double check her home readings. If elevated, I will adjust her medications.   insominia f/u: well controlled on chronic ambien. Refilled. Reviewed risks/benefits  HM: rec mammo and dexa. Reordered. Pt to schedule.  Education regarding management of these chronic disease states was given. Management strategies discussed on successive visits include dietary and exercise recommendations, goals of achieving and maintaining IBW, and lifestyle modifications aiming for adequate sleep and minimizing stressors.   Follow up: cpe due in November w/ awv  Orders Placed This Encounter  Procedures  . MM DIGITAL SCREENING BILATERAL   No orders of the defined  types were placed in this encounter.     BP Readings from Last 3 Encounters:  06/05/19 (!) 148/90  03/21/19 122/86  12/06/18 124/78   Wt Readings from Last 3 Encounters:  06/05/19 172 lb (78 kg)  03/21/19 176 lb 12.8 oz (80.2 kg)  12/06/18 182 lb (82.6 kg)    Lab Results  Component Value Date   CHOL 140 12/06/2018   CHOL 143 05/26/2017   CHOL 145 01/31/2014   Lab Results  Component Value Date   HDL 52.80 12/06/2018   HDL 52.60 05/26/2017   HDL 53.30 01/31/2014   Lab Results  Component Value Date   LDLCALC 52 12/06/2018   LDLCALC 70 05/26/2017   LDLCALC 66 01/31/2014   Lab Results  Component Value Date   TRIG 175.0 (H) 12/06/2018   TRIG 100.0 05/26/2017   TRIG 128.0 01/31/2014   Lab Results  Component Value Date   CHOLHDL 3 12/06/2018   CHOLHDL 3 05/26/2017   CHOLHDL 3 01/31/2014   Lab Results  Component Value Date   LDLDIRECT 112.6 11/07/2008   LDLDIRECT 136.6 01/09/2007   Lab Results  Component Value Date   CREATININE 0.86 03/21/2019   BUN 17 03/21/2019   NA 140 03/21/2019   K 4.5 03/21/2019   CL 105 03/21/2019   CO2 28 03/21/2019    The 10-year ASCVD risk score [name], et al., 2013) is: 26.4%   Values used to calculate the score:     Age: 84 years  Sex: Female     Is Non-Hispanic African American: No     Diabetic: No     Tobacco smoker: No     Systolic Blood Pressure: 123456 mmHg     Is BP treated: Yes     HDL Cholesterol: 52.8 mg/dL     Total Cholesterol: 140 mg/dL  I reviewed the patients updated PMH, FH, and SocHx.    Patient Active Problem List   Diagnosis Date Noted  . Obesity (BMI 30-39.9) 11/10/2012    Priority: High  . Acquired hypothyroidism 01/09/2007    Priority: High  . Mixed hyperlipidemia 01/09/2007    Priority: High  . Essential hypertension 01/09/2007    Priority: High  . Irritable bowel syndrome with diarrhea 06/17/2015    Priority: Medium  . Osteopenia 09/10/2014    Priority: Medium  . OAB (overactive  bladder) 02/24/2012    Priority: Medium  . GERD 03/27/2009    Priority: Medium  . Primary insomnia 03/02/2007    Priority: Medium  . 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 Uses hearing aid 12/22/2015    Priority: Low    Allergies: Adhesive [tape] and Other  Social History: Patient  reports that she has never smoked. She has never used smokeless tobacco. She reports that she does not drink alcohol or use drugs.  Current Meds  Medication Sig  . aspirin 81 MG tablet Take 81 mg by mouth daily.    . benazepril (LOTENSIN) 20 MG tablet Take 1 tablet (20 mg total) by mouth daily.  . Calcium Carb-Cholecalciferol (CALCIUM-VITAMIN D3) 600-500 MG-UNIT CAPS Take 1 capsule by mouth daily.  Marland Kitchen ELDERBERRY PO Take by mouth.  . levothyroxine (SYNTHROID) 125 MCG tablet TAKE 1 TABLET BY MOUTH  DAILY BEFORE BREAKFAST  . lovastatin (MEVACOR) 40 MG tablet TAKE 1 TABLET BY MOUTH AT  BEDTIME  . Multiple Vitamin (MULTIVITAMIN) tablet Take 1 tablet by mouth daily.    . propranolol (INDERAL) 20 MG tablet Take 1 tablet (20 mg total) by mouth 2 (two) times daily.  . Zinc Sulfate (ZINC 15 PO) Take by mouth.  . zolpidem (AMBIEN) 10 MG tablet Take 1 tablet (10 mg total) by mouth at bedtime as needed. for sleep    Review of Systems: Cardiovascular: negative for chest pain, palpitations, leg swelling, orthopnea Respiratory: negative for SOB, wheezing or persistent cough Gastrointestinal: negative for abdominal pain Genitourinary: negative for dysuria or gross hematuria  Objective  Vitals: BP (!) 148/90   Pulse (!) 56   Temp (!) 97.2 F (36.2 C) (Temporal)   Resp 15   Ht 5\' 3"  (1.6 m)   Wt 172 lb (78 kg)   SpO2 96%   BMI 30.47 kg/m  General: no acute distress  Psych:  Alert and oriented, normal mood and affect HEENT:  Normocephalic, atraumatic, supple neck  Cardiovascular:  RRR without murmur. no LE edema Respiratory:  Good breath sounds bilaterally, CTAB with normal respiratory  effort Skin:  Warm, no rashes Neurologic:   Mental status is normal  Commons side effects, risks, benefits, and alternatives for medications and treatment plan prescribed today were discussed, and the patient expressed understanding of the given instructions. Patient is instructed to call or message via MyChart if he/she has any questions or concerns regarding our treatment plan. No barriers to understanding were identified. We discussed Red Flag symptoms and signs in detail. Patient expressed understanding regarding what to do in case of urgent or emergency type symptoms.  Medication list was reconciled, printed and provided to the patient in AVS. Patient instructions and summary information was reviewed with the patient as documented in the AVS. This note was prepared with assistance of Dragon voice recognition software. Occasional wrong-word or sound-a-like substitutions may have occurred due to the inherent limitations of voice recognition software  This visit occurred during the SARS-CoV-2 public health emergency.  Safety protocols were in place, including screening questions prior to the visit, additional usage of staff PPE, and extensive cleaning of exam room while observing appropriate contact time as indicated for disinfecting solutions.

## 2019-06-05 NOTE — Patient Instructions (Addendum)
Please return in November 2022 for your annual complete physical; please come fasting. Please cancel the appt in September.  You may schedule an AWV in November as well.   Please let me know if your blood pressures are running above 140/90 at home.  i'd like them to be 120s/70s - 130s/80s consistently. If it is high, I will adjust your blood pressure medications.  Keep to a low salt diet.   If you have any questions or concerns, please don't hesitate to send me a message via MyChart or call the office at [phone]. Thank you for visiting with [place] today! It's our pleasure caring for you.  I have ordered a mammogram and/or bone density for you as we discussed today: [x]   Mammogram  [x]   Bone Density  Please call the office checked below to schedule your appointment: Your appointment will at the following location  [x]   [facility]      [place], [place]         []   [facility]  [place], [place]  Make sure to wear two peace clothing  No lotions powders or deodorants the day of the appointment Make sure to bring picture ID and insurance card.  Bring list of medications you are currently taking including any supplements.     Hypertension, Adult High blood pressure (hypertension) is when the force of blood pumping through the arteries is too strong. The arteries are the blood vessels that carry blood from the heart throughout the body. Hypertension forces the heart to work harder to pump blood and may cause arteries to become narrow or stiff. Untreated or uncontrolled hypertension can cause a heart attack, heart failure, a stroke, kidney disease, and other problems. A blood pressure reading consists of a higher number over a lower number. Ideally, your blood pressure should be below 120/80. The first ("top") number is called the systolic pressure. It is a measure of the  pressure in your arteries as your heart beats. The second ("bottom") number is called the diastolic pressure. It is a measure of the pressure in your arteries as the heart relaxes. What are the causes? The exact cause of this condition is not known. There are some conditions that result in or are related to high blood pressure. What increases the risk? Some risk factors for high blood pressure are under your control. The following factors may make you more likely to develop this condition:  Smoking.  Having type 2 diabetes mellitus, high cholesterol, or both.  Not getting enough exercise or physical activity.  Being overweight.  Having too much fat, sugar, calories, or salt (sodium) in your diet.  Drinking too much alcohol. Some risk factors for high blood pressure may be difficult or impossible to change. Some of these factors include:  Having chronic kidney disease.  Having a family history of high blood pressure.  Age. Risk increases with age.  Race. You may be at higher risk if you are African American.  Gender. Men are at higher risk than women before age 9. After age 15, women are at higher risk than men.  Having obstructive sleep apnea.  Stress. What are the signs or symptoms? High blood pressure may not cause symptoms. Very high blood pressure (hypertensive crisis) may cause:  Headache.  Anxiety.  Shortness of breath.  Nosebleed.  Nausea and vomiting.  Vision changes.  Severe chest pain.  Seizures. How is this diagnosed? This condition is diagnosed by measuring your blood pressure while you are seated, with your arm resting on a flat surface, your legs uncrossed, and your feet flat on the floor. The cuff of the blood pressure monitor will be placed directly against the skin of your upper arm at the level of your heart. It should be measured at least twice using the same arm. Certain conditions can cause a difference in blood pressure between your right and  left arms. Certain factors can cause blood pressure readings to be lower or higher than normal for a short period of time:  When your blood pressure is higher when you are in a health care provider's office than when you are at home, this is called white coat hypertension. Most people with this condition do not need medicines.  When your blood pressure is higher at home than when you are in a health care provider's office, this is called masked hypertension. Most people with this condition may need medicines to control blood pressure. If you have a high blood pressure reading during one visit or you have normal blood pressure with other risk factors, you may be asked to:  Return on a different day to have your blood pressure checked again.  Monitor your blood pressure at home for 1 week or longer. If you are diagnosed with hypertension, you may have other blood or imaging tests to help your health care provider understand your overall risk for other conditions. How is this treated? This condition is treated by making healthy lifestyle changes, such as eating healthy foods, exercising more, and reducing your alcohol intake. Your health care provider may prescribe medicine if lifestyle changes are not enough to get your blood pressure under control, and if:  Your systolic blood pressure is above 130.  Your diastolic blood pressure is above 80. Your personal target blood pressure may vary depending on your medical conditions, your age, and other factors. Follow these instructions at home: Eating and drinking   Eat a diet that is high in fiber and potassium, and low in sodium, added sugar, and fat. An example eating plan is called the DASH (Dietary Approaches to Stop Hypertension) diet. To eat this way: ? Eat plenty of fresh fruits and vegetables. Try to fill one half of your plate at each meal with fruits and vegetables. ? Eat whole grains, such as whole-wheat pasta, brown rice, or whole-grain  bread. Fill about one fourth of your plate with whole grains. ? Eat or drink low-fat dairy products, such as skim milk or low-fat yogurt. ? Avoid fatty cuts of meat, processed or cured meats, and poultry with skin. Fill about one fourth of your plate with lean proteins, such as fish, chicken without skin, beans, eggs, or tofu. ? Avoid pre-made and processed foods. These tend to be higher in sodium, added sugar, and fat.  Reduce your daily sodium intake. Most people with hypertension should eat less than 1,500 mg of sodium a day.  Do not drink alcohol if: ? Your health care provider tells you not to drink. ? You are pregnant, may be pregnant, or are planning to become pregnant.  If you drink alcohol: ? Limit how much you use to:  0-1 drink a day for women.  0-2 drinks a day for men. ? Be aware of how much alcohol is in your drink. In the [place] one drink equals one  12 oz bottle of beer (355 mL), one 5 oz glass of wine (148 mL), or one 1 oz glass of hard liquor (44 mL). Lifestyle   Work with your health care provider to maintain a healthy body weight or to lose weight. Ask what an ideal weight is for you.  Get at least 30 minutes of exercise most days of the week. Activities may include walking, swimming, or biking.  Include exercise to strengthen your muscles (resistance exercise), such as Pilates or lifting weights, as part of your weekly exercise routine. Try to do these types of exercises for 30 minutes at least 3 days a week.  Do not use any products that contain nicotine or tobacco, such as cigarettes, e-cigarettes, and chewing tobacco. If you need help quitting, ask your health care provider.  Monitor your blood pressure at home as told by your health care provider.  Keep all follow-up visits as told by your health care provider. This is important. Medicines  Take over-the-counter and prescription medicines only as told by your health care provider. Follow directions carefully.  Blood pressure medicines must be taken as prescribed.  Do not skip doses of blood pressure medicine. Doing this puts you at risk for problems and can make the medicine less effective.  Ask your health care provider about side effects or reactions to medicines that you should watch for. Contact a health care provider if you:  Think you are having a reaction to a medicine you are taking.  Have headaches that keep coming back (recurring).  Feel dizzy.  Have swelling in your ankles.  Have trouble with your vision. Get help right away if you:  Develop a severe headache or confusion.  Have unusual weakness or numbness.  Feel faint.  Have severe pain in your chest or abdomen.  Vomit repeatedly.  Have trouble breathing. Summary  Hypertension is when the force of blood pumping through your arteries is too strong. If this condition is not controlled, it may put you at risk for serious complications.  Your personal target blood pressure may vary depending on your medical conditions, your age, and other factors. For most people, a normal blood pressure is less than 120/80.  Hypertension is treated with lifestyle changes, medicines, or a combination of both. Lifestyle changes include losing weight, eating a healthy, low-sodium diet, exercising more, and limiting alcohol. This information is not intended to replace advice given to you by your health care provider. Make sure you discuss any questions you have with your health care provider. Document Revised: 09/14/2017 Document Reviewed: 09/14/2017 Elsevier Patient Education  2020 [employer].

## 2019-07-24 ENCOUNTER — Telehealth: Payer: Self-pay | Admitting: Family Medicine

## 2019-07-24 NOTE — Telephone Encounter (Signed)
FYI

## 2019-07-24 NOTE — Telephone Encounter (Signed)
Blood pressure readings   6/27 - 179/106    7/1- 185/107    7/4 - 193/114

## 2019-07-25 DIAGNOSIS — H2513 Age-related nuclear cataract, bilateral: Secondary | ICD-10-CM | POA: Diagnosis not present

## 2019-07-25 DIAGNOSIS — H5203 Hypermetropia, bilateral: Secondary | ICD-10-CM | POA: Diagnosis not present

## 2019-07-25 NOTE — Telephone Encounter (Signed)
Please call pt: need in person office visit to recheck bp and tell her to bring in her cuff as well.  Please schedule. thanks

## 2019-07-26 NOTE — Telephone Encounter (Signed)
Patient is scheduled for 08/22/19

## 2019-08-22 ENCOUNTER — Ambulatory Visit: Payer: Medicare Other | Admitting: Family Medicine

## 2019-08-24 ENCOUNTER — Ambulatory Visit
Admission: RE | Admit: 2019-08-24 | Discharge: 2019-08-24 | Disposition: A | Discharge status: Home / Self Care | Payer: Medicare Other | Source: Ambulatory Visit | Attending: Family Medicine | Admitting: Family Medicine

## 2019-08-24 ENCOUNTER — Other Ambulatory Visit: Payer: Self-pay

## 2019-08-24 ENCOUNTER — Telehealth: Payer: Self-pay | Admitting: Family Medicine

## 2019-08-24 DIAGNOSIS — Z780 Asymptomatic menopausal state: Secondary | ICD-10-CM | POA: Diagnosis not present

## 2019-08-24 DIAGNOSIS — Z1231 Encounter for screening mammogram for malignant neoplasm of breast: Secondary | ICD-10-CM | POA: Diagnosis not present

## 2019-08-24 DIAGNOSIS — M8589 Other specified disorders of bone density and structure, multiple sites: Secondary | ICD-10-CM | POA: Diagnosis not present

## 2019-08-24 DIAGNOSIS — E2839 Other primary ovarian failure: Secondary | ICD-10-CM

## 2019-08-24 NOTE — Telephone Encounter (Signed)
Patient is returning someone's call about results.

## 2019-08-24 NOTE — Telephone Encounter (Signed)
Spoke with the patient about her bone density results.

## 2019-08-24 NOTE — Progress Notes (Signed)
Please call patient: I have reviewed his/her dexa results. Her bone density test shows stable osteopenia, or low bone mass. Calcium, vit D and weight bearing exercises help to keep from losing bone density. Nothing else is needed at this time. Recheck 2 years. Thanks.

## 2019-09-10 ENCOUNTER — Other Ambulatory Visit: Payer: Self-pay | Admitting: Family Medicine

## 2019-09-10 ENCOUNTER — Ambulatory Visit (INDEPENDENT_AMBULATORY_CARE_PROVIDER_SITE_OTHER): Payer: Medicare Other | Admitting: Family Medicine

## 2019-09-10 ENCOUNTER — Other Ambulatory Visit: Payer: Self-pay

## 2019-09-10 ENCOUNTER — Encounter: Payer: Self-pay | Admitting: Family Medicine

## 2019-09-10 VITALS — BP 170/100 | HR 57 | Temp 97.4°F | Resp 15 | Ht 63.0 in | Wt 170.2 lb

## 2019-09-10 DIAGNOSIS — I10 Essential (primary) hypertension: Secondary | ICD-10-CM

## 2019-09-10 MED ORDER — AMLODIPINE BESYLATE 10 MG PO TABS
10.0000 mg | ORAL_TABLET | Freq: Every day | ORAL | 3 refills | Status: DC
Start: 2019-09-10 — End: 2019-09-10

## 2019-09-10 MED ORDER — BENAZEPRIL HCL 40 MG PO TABS: 40.0000 mg | ORAL_TABLET | Freq: Every day | ORAL | 3 refills | Status: DC

## 2019-09-10 MED FILL — BENAZEPRIL HCL 40 MG TABLET: 40 | 90 days supply | Qty: 90 | Fill #0

## 2019-09-10 MED FILL — AMLODIPINE BESYLATE 10 MG T: 10 | 90 days supply | Qty: 90 | Fill #0

## 2019-09-10 NOTE — Patient Instructions (Signed)
Please schedule a visit in 6 weeks for blood pressure follow up.  Please follow up as scheduled for your next visit with me: 12/11/2019 for your physical.   Check your blood pressures at different times during the day and bring in your log to your next appointment.  Stop the propranolol and start amlodipine once a day instead. I have increased the dose of your benazapril as well.   If you have any questions or concerns, please don't hesitate to send me a message via MyChart or call the office at [phone]. Thank you for visiting with Alexis Banks today! It's our pleasure caring for you.

## 2019-09-10 NOTE — Progress Notes (Signed)
Subjective  CC:  Chief Complaint  Patient presents with  . Hypertension    elevated BP in AM    HPI: Alexis Banks is a 74 y.o. female who presents to the office today to address the problems listed above in the chief complaint.  Hypertension f/u: home readings have been elevated: 160s-170s/90-100. Feels well. All bp readings were taken before she took her meds. No CP or sob. Has been on benazapril and propranolol for many years w/o adjustment. No leg edema or light headedness.    Assessment  1. Essential hypertension      Plan    Hypertension f/u: BP control is poorly controlled. Increase benazapril dose to 40 daily and stop bb due to low HR and change to amlodipine 10 daily. Recheck 6 weeks. Continue home readings. Her cuff was used today and showed same reading as ours. rec checking throughout the day, after taking meds together in the mornings.   Education regarding management of these chronic disease states was given. Management strategies discussed on successive visits include dietary and exercise recommendations, goals of achieving and maintaining IBW, and lifestyle modifications aiming for adequate sleep and minimizing stressors.   Follow up: 6 weeks for bp check  No orders of the defined types were placed in this encounter.  Meds ordered this encounter  Medications  . benazepril (LOTENSIN) 40 MG tablet    Sig: Take 1 tablet (40 mg total) by mouth daily.    Dispense:  90 tablet    Refill:  3    Increasing dose from 20mg  daily. thanks  . amLODipine (NORVASC) 10 MG tablet    Sig: Take 1 tablet (10 mg total) by mouth daily.    Dispense:  90 tablet    Refill:  3      BP Readings from Last 3 Encounters:  09/10/19 (!) 170/100  06/05/19 (!) 148/90  03/21/19 122/86   Wt Readings from Last 3 Encounters:  09/10/19 170 lb 3.2 oz (77.2 kg)  06/05/19 172 lb (78 kg)  03/21/19 176 lb 12.8 oz (80.2 kg)    Lab Results  Component Value Date   CHOL 140 12/06/2018     CHOL 143 05/26/2017   CHOL 145 01/31/2014   Lab Results  Component Value Date   HDL 52.80 12/06/2018   HDL 52.60 05/26/2017   HDL 53.30 01/31/2014   Lab Results  Component Value Date   LDLCALC 52 12/06/2018   LDLCALC 70 05/26/2017   LDLCALC 66 01/31/2014   Lab Results  Component Value Date   TRIG 175.0 (H) 12/06/2018   TRIG 100.0 05/26/2017   TRIG 128.0 01/31/2014   Lab Results  Component Value Date   CHOLHDL 3 12/06/2018   CHOLHDL 3 05/26/2017   CHOLHDL 3 01/31/2014   Lab Results  Component Value Date   LDLDIRECT 112.6 11/07/2008   LDLDIRECT 136.6 01/09/2007   Lab Results  Component Value Date   CREATININE 0.86 03/21/2019   BUN 17 03/21/2019   NA 140 03/21/2019   K 4.5 03/21/2019   CL 105 03/21/2019   CO2 28 03/21/2019    The 10-year ASCVD risk score [name], et al., 2013) is: 33.3%   Values used to calculate the score:     Age: 14 years     Sex: Female     Is Non-Hispanic African American: No     Diabetic: No     Tobacco smoker: No     Systolic Blood Pressure: 491 mmHg  Is BP treated: Yes     HDL Cholesterol: 52.8 mg/dL     Total Cholesterol: 140 mg/dL  I reviewed the patients updated PMH, FH, and SocHx.    Patient Active Problem List   Diagnosis Date Noted  . Obesity (BMI 30-39.9) 11/10/2012    Priority: High  . Acquired hypothyroidism 01/09/2007    Priority: High  . Mixed hyperlipidemia 01/09/2007    Priority: High  . Essential hypertension 01/09/2007    Priority: High  . Irritable bowel syndrome with diarrhea 06/17/2015    Priority: Medium  . Osteopenia 09/10/2014    Priority: Medium  . OAB (overactive bladder) 02/24/2012    Priority: Medium  . GERD 03/27/2009    Priority: Medium  . Primary insomnia 03/02/2007    Priority: Medium  . 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 Uses hearing aid 12/22/2015    Priority: Low    Allergies: Adhesive [tape]  Social History: Patient  reports that she has never  smoked. She has never used smokeless tobacco. She reports that she does not drink alcohol and does not use drugs.  Current Meds  Medication Sig  . aspirin 81 MG tablet Take 81 mg by mouth daily.    . benazepril (LOTENSIN) 40 MG tablet Take 1 tablet (40 mg total) by mouth daily.  . Calcium Carb-Cholecalciferol (CALCIUM-VITAMIN D3) 600-500 MG-UNIT CAPS Take 1 capsule by mouth daily.  Marland Kitchen ELDERBERRY PO Take by mouth.  . levothyroxine (SYNTHROID) 125 MCG tablet TAKE 1 TABLET BY MOUTH  DAILY BEFORE BREAKFAST  . lovastatin (MEVACOR) 40 MG tablet TAKE 1 TABLET BY MOUTH AT  BEDTIME  . Multiple Vitamin (MULTIVITAMIN) tablet Take 1 tablet by mouth daily.    . Zinc Sulfate (ZINC 15 PO) Take by mouth.  . zolpidem (AMBIEN) 10 MG tablet Take 1 tablet (10 mg total) by mouth at bedtime as needed. for sleep  . [DISCONTINUED] benazepril (LOTENSIN) 20 MG tablet Take 1 tablet (20 mg total) by mouth daily.  . [DISCONTINUED] propranolol (INDERAL) 20 MG tablet Take 1 tablet (20 mg total) by mouth 2 (two) times daily.    Review of Systems: Cardiovascular: negative for chest pain, palpitations, leg swelling, orthopnea Respiratory: negative for SOB, wheezing or persistent cough Gastrointestinal: negative for abdominal pain Genitourinary: negative for dysuria or gross hematuria  Objective  Vitals: BP (!) 170/100   Pulse (!) 57   Temp (!) 97.4 F (36.3 C) (Temporal)   Resp 15   Ht 5\' 3"  (1.6 m)   Wt 170 lb 3.2 oz (77.2 kg)   SpO2 96%   BMI 30.15 kg/m  General: no acute distress  Psych:  Alert and oriented, normal mood and affect Cardiovascular:  RRR without murmur. No gallopu Respiratory:  Good breath sounds bilaterally, CTAB with normal respiratory effort No edema  Commons side effects, risks, benefits, and alternatives for medications and treatment plan prescribed today were discussed, and the patient expressed understanding of the given instructions. Patient is instructed to call or message via MyChart  if he/she has any questions or concerns regarding our treatment plan. No barriers to understanding were identified. We discussed Red Flag symptoms and signs in detail. Patient expressed understanding regarding what to do in case of urgent or emergency type symptoms.   Medication list was reconciled, printed and provided to the patient in AVS. Patient instructions and summary information was reviewed with the patient as documented in the AVS. This note was prepared with assistance of Dragon  voice recognition software. Occasional wrong-word or sound-a-like substitutions may have occurred due to the inherent limitations of voice recognition software  This visit occurred during the SARS-CoV-2 public health emergency.  Safety protocols were in place, including screening questions prior to the visit, additional usage of staff PPE, and extensive cleaning of exam room while observing appropriate contact time as indicated for disinfecting solutions.

## 2019-09-19 ENCOUNTER — Encounter: Payer: Medicare Other | Admitting: Family Medicine

## 2019-10-14 ENCOUNTER — Other Ambulatory Visit: Payer: Self-pay | Admitting: Family Medicine

## 2019-10-19 ENCOUNTER — Encounter: Payer: Self-pay | Admitting: Family Medicine

## 2019-10-19 ENCOUNTER — Other Ambulatory Visit: Payer: Self-pay | Admitting: Family Medicine

## 2019-10-19 ENCOUNTER — Ambulatory Visit (INDEPENDENT_AMBULATORY_CARE_PROVIDER_SITE_OTHER): Payer: Medicare Other | Admitting: Family Medicine

## 2019-10-19 ENCOUNTER — Other Ambulatory Visit: Payer: Self-pay

## 2019-10-19 VITALS — BP 150/90 | HR 70 | Temp 97.3°F | Resp 16 | Ht 63.0 in | Wt 168.0 lb

## 2019-10-19 DIAGNOSIS — I10 Essential (primary) hypertension: Secondary | ICD-10-CM | POA: Diagnosis not present

## 2019-10-19 DIAGNOSIS — M8580 Other specified disorders of bone density and structure, unspecified site: Secondary | ICD-10-CM | POA: Diagnosis not present

## 2019-10-19 DIAGNOSIS — Z23 Encounter for immunization: Secondary | ICD-10-CM | POA: Diagnosis not present

## 2019-10-19 DIAGNOSIS — F5101 Primary insomnia: Secondary | ICD-10-CM | POA: Diagnosis not present

## 2019-10-19 MED ORDER — ZOLPIDEM TARTRATE 10 MG PO TABS: 10.0000 mg | ORAL_TABLET | Freq: Every evening | ORAL | 5 refills | Status: DC | PRN

## 2019-10-19 MED ORDER — HYDROCHLOROTHIAZIDE 25 MG PO TABS
25.0000 mg | ORAL_TABLET | Freq: Every day | ORAL | 3 refills | Status: DC
Start: 2019-10-19 — End: 2019-11-07

## 2019-10-19 MED FILL — HYDROCHLOROTHIAZIDE 25 MG T: 25 | 90 days supply | Qty: 90 | Fill #0

## 2019-10-19 NOTE — Patient Instructions (Signed)
Please return in November as scheduled for your physical and blood pressure follow up.   I've added another BP pill: HCTZ once daily. Take all of your blood pressure pills together.  I've refilled your ambien.   If you have any questions or concerns, please don't hesitate to send me a message via MyChart or call the office at [phone]. Thank you for visiting with [place] today! It's our pleasure caring for you.

## 2019-10-19 NOTE — Progress Notes (Signed)
Subjective  CC:  Chief Complaint  Patient presents with  . Hypertension    BP has been running around 140/80's at home that past 6 weeks     HPI: Alexis Banks is a 74 y.o. female who presents to the office today to address the problems listed above in the chief complaint.  Hypertension f/u: we stopped BB and added amlodipine and increased ACE 6 weeks ago due to uncontrolled bp and low HR. Control is now  improved. Home readings are 140s-150s/80s-90. Pt reports she is doing well. taking medications as instructed, no medication side effects noted, no TIAs, no chest pain on exertion, no dyspnea on exertion, no swelling of ankles. She denies adverse effects from his BP medications. Compliance with medication is good.   Reviewed recent dexa scan results together. T = -2.3 lowest. Walks regularly. Takes calcium and vit d. No h/o osteoporosis.   primary insomnia: has been stable on ambien w/o aes. Needs refill.   Assessment  1. Essential hypertension   2. Osteopenia, unspecified location   3. Need for immunization against influenza   4. Primary insomnia      Plan    Hypertension f/u: BP control is fairly well controlled by home readings but still not where we want it. Add hctz. Education given. Has f/u appt in 6 weeks for labs and cpe and recheck  Insomnia f/u: well controlled on ambien. Understands risks/benefits.   Osteopenia: stable. Continue preventive, supportive care and recheck in 2 years.   Flu shot updated today.   Education regarding management of these chronic disease states was given. Management strategies discussed on successive visits include dietary and exercise recommendations, goals of achieving and maintaining IBW, and lifestyle modifications aiming for adequate sleep and minimizing stressors.   Follow up: nove cpe  Orders Placed This Encounter  Procedures  . Flu Vaccine QUAD High Dose(Fluad)   Meds ordered this encounter  Medications  .  hydrochlorothiazide (HYDRODIURIL) 25 MG tablet    Sig: Take 1 tablet (25 mg total) by mouth daily.    Dispense:  90 tablet    Refill:  3  . zolpidem (AMBIEN) 10 MG tablet    Sig: Take 1 tablet (10 mg total) by mouth at bedtime as needed. for sleep    Dispense:  30 tablet    Refill:  5      BP Readings from Last 3 Encounters:  10/19/19 (!) 150/90  09/10/19 (!) 170/100  06/05/19 (!) 148/90   Wt Readings from Last 3 Encounters:  10/19/19 168 lb (76.2 kg)  09/10/19 170 lb 3.2 oz (77.2 kg)  06/05/19 172 lb (78 kg)    Lab Results  Component Value Date   CHOL 140 12/06/2018   CHOL 143 05/26/2017   CHOL 145 01/31/2014   Lab Results  Component Value Date   HDL 52.80 12/06/2018   HDL 52.60 05/26/2017   HDL 53.30 01/31/2014   Lab Results  Component Value Date   LDLCALC 52 12/06/2018   LDLCALC 70 05/26/2017   LDLCALC 66 01/31/2014   Lab Results  Component Value Date   TRIG 175.0 (H) 12/06/2018   TRIG 100.0 05/26/2017   TRIG 128.0 01/31/2014   Lab Results  Component Value Date   CHOLHDL 3 12/06/2018   CHOLHDL 3 05/26/2017   CHOLHDL 3 01/31/2014   Lab Results  Component Value Date   LDLDIRECT 112.6 11/07/2008   LDLDIRECT 136.6 01/09/2007   Lab Results  Component Value Date   CREATININE  0.86 03/21/2019   BUN 17 03/21/2019   NA 140 03/21/2019   K 4.5 03/21/2019   CL 105 03/21/2019   CO2 28 03/21/2019    The 10-year ASCVD risk score [name], et al., 2013) is: 27%   Values used to calculate the score:     Age: 30 years     Sex: Female     Is Non-Hispanic African American: No     Diabetic: No     Tobacco smoker: No     Systolic Blood Pressure: 299 mmHg     Is BP treated: Yes     HDL Cholesterol: 52.8 mg/dL     Total Cholesterol: 140 mg/dL  I reviewed the patients updated PMH, FH, and SocHx.    Patient Active Problem List   Diagnosis Date Noted  . Obesity (BMI 30-39.9) 11/10/2012    Priority: High  . Acquired hypothyroidism 01/09/2007     Priority: High  . Mixed hyperlipidemia 01/09/2007    Priority: High  . Essential hypertension 01/09/2007    Priority: High  . Irritable bowel syndrome with diarrhea 06/17/2015    Priority: Medium  . Osteopenia 09/10/2014    Priority: Medium  . OAB (overactive bladder) 02/24/2012    Priority: Medium  . GERD 03/27/2009    Priority: Medium  . Primary insomnia 03/02/2007    Priority: Medium  . 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 Uses hearing aid 12/22/2015    Priority: Low    Allergies: Adhesive [tape]  Social History: Patient  reports that she has never smoked. She has never used smokeless tobacco. She reports that she does not drink alcohol and does not use drugs.  Current Meds  Medication Sig  . amLODipine (NORVASC) 10 MG tablet Take 1 tablet (10 mg total) by mouth daily.  Marland Kitchen aspirin 81 MG tablet Take 81 mg by mouth daily.    . benazepril (LOTENSIN) 40 MG tablet Take 1 tablet (40 mg total) by mouth daily.  . Calcium Carb-Cholecalciferol (CALCIUM-VITAMIN D3) 600-500 MG-UNIT CAPS Take 1 capsule by mouth daily.  Marland Kitchen ELDERBERRY PO Take by mouth.  . levothyroxine (SYNTHROID) 125 MCG tablet TAKE 1 TABLET BY MOUTH  DAILY BEFORE BREAKFAST  . lovastatin (MEVACOR) 40 MG tablet TAKE 1 TABLET BY MOUTH AT  BEDTIME  . Multiple Vitamin (MULTIVITAMIN) tablet Take 1 tablet by mouth daily.    . Zinc Sulfate (ZINC 15 PO) Take by mouth.  . zolpidem (AMBIEN) 10 MG tablet Take 1 tablet (10 mg total) by mouth at bedtime as needed. for sleep  . [DISCONTINUED] zolpidem (AMBIEN) 10 MG tablet Take 1 tablet (10 mg total) by mouth at bedtime as needed. for sleep    Review of Systems: Cardiovascular: negative for chest pain, palpitations, leg swelling, orthopnea Respiratory: negative for SOB, wheezing or persistent cough Gastrointestinal: negative for abdominal pain Genitourinary: negative for dysuria or gross hematuria  Objective  Vitals: BP (!) 150/90   Pulse 70   Temp (!) 97.3  F (36.3 C) (Temporal)   Resp 16   Ht 5\' 3"  (1.6 m)   Wt 168 lb (76.2 kg)   SpO2 97%   BMI 29.76 kg/m  General: no acute distress  Psych:  Alert and oriented, normal mood and affect HEENT:  Normocephalic, atraumatic, supple neck  Cardiovascular:  RRR without murmur. no edema Respiratory:  Good breath sounds bilaterally, CTAB with normal respiratory effort   Commons side effects, risks, benefits, and alternatives for medications  and treatment plan prescribed today were discussed, and the patient expressed understanding of the given instructions. Patient is instructed to call or message via MyChart if he/she has any questions or concerns regarding our treatment plan. No barriers to understanding were identified. We discussed Red Flag symptoms and signs in detail. Patient expressed understanding regarding what to do in case of urgent or emergency type symptoms.   Medication list was reconciled, printed and provided to the patient in AVS. Patient instructions and summary information was reviewed with the patient as documented in the AVS. This note was prepared with assistance of Dragon voice recognition software. Occasional wrong-word or sound-a-like substitutions may have occurred due to the inherent limitations of voice recognition software  This visit occurred during the SARS-CoV-2 public health emergency.  Safety protocols were in place, including screening questions prior to the visit, additional usage of staff PPE, and extensive cleaning of exam room while observing appropriate contact time as indicated for disinfecting solutions.

## 2019-10-21 ENCOUNTER — Other Ambulatory Visit: Payer: Self-pay | Admitting: Family Medicine

## 2019-10-28 ENCOUNTER — Other Ambulatory Visit: Payer: Self-pay | Admitting: Family Medicine

## 2019-10-30 ENCOUNTER — Encounter: Payer: Self-pay | Admitting: Family Medicine

## 2019-10-31 NOTE — Telephone Encounter (Signed)
Pt wants to make sure of which medication she is supposed to stop taking.

## 2019-10-31 NOTE — Telephone Encounter (Signed)
Please call patient.  She should hold her hctz for 1-3 days to see how she feels. Then check her blood pressures; if running > 140/90, may restart hctz 12/ tab daily.  Continue all other medications.

## 2019-11-02 ENCOUNTER — Encounter: Payer: Self-pay | Admitting: Family Medicine

## 2019-11-02 NOTE — Telephone Encounter (Signed)
Please call her to clarify: Which medications is she talking about.  I do not want her to stop her blood pressure medications.  The hctz was the only new one.   Sounds like she needs an ov; please schedule.

## 2019-11-05 NOTE — Telephone Encounter (Signed)
Hey there, please call the patient when I say "call patient". Ie, don't send another note. Thanks.

## 2019-11-07 ENCOUNTER — Encounter: Payer: Self-pay | Admitting: Family Medicine

## 2019-11-07 ENCOUNTER — Other Ambulatory Visit: Payer: Self-pay

## 2019-11-07 ENCOUNTER — Ambulatory Visit (INDEPENDENT_AMBULATORY_CARE_PROVIDER_SITE_OTHER): Payer: Medicare Other | Admitting: Family Medicine

## 2019-11-07 VITALS — Temp 97.2°F | Wt 165.6 lb

## 2019-11-07 DIAGNOSIS — I10 Essential (primary) hypertension: Secondary | ICD-10-CM

## 2019-11-07 DIAGNOSIS — I951 Orthostatic hypotension: Secondary | ICD-10-CM

## 2019-11-07 DIAGNOSIS — M25511 Pain in right shoulder: Secondary | ICD-10-CM

## 2019-11-07 DIAGNOSIS — M542 Cervicalgia: Secondary | ICD-10-CM

## 2019-11-07 NOTE — Patient Instructions (Addendum)
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  Use ES tylenol twice daily with extra doses as needed. You may also try advil as needed.   Please take your blood pressure medications: benazapril 40, amlodipine 10  together every morning. But stop the HCTZ 25. Your blood pressure may be dropping when you stand.    If you have any questions or concerns, please don't hesitate to send me a message via MyChart or call the office at [phone]. Thank you for visiting with [place] today! It's our pleasure caring for you.   Orthostatic Hypotension Blood pressure is a measurement of how strongly, or weakly, your blood is pressing against the walls of your arteries. Orthostatic hypotension is a sudden drop in blood pressure that happens when you quickly change positions, such as when you get up from sitting or lying down. Arteries are blood vessels that carry blood from your heart throughout your body. When blood pressure is too low, you may not get enough blood to your brain or to the rest of your organs. This can cause weakness, light-headedness, rapid heartbeat, and fainting. This can last for just a few seconds or for up to a few minutes. Orthostatic hypotension is usually not a serious problem. However, if it happens frequently or gets worse, it may be a sign of something more serious. What are the causes? This condition may be caused by:  Sudden changes in posture, such as standing up quickly after you have been sitting or lying down.  Blood loss.  Loss of body fluids (dehydration).  Heart problems.  Hormone (endocrine) problems.  Pregnancy.  Severe infection.  Lack of certain nutrients.  Severe allergic reactions (anaphylaxis).  Certain medicines, such as blood pressure medicine or medicines that make the body lose excess fluids (diuretics). Sometimes, this condition can be caused by not  taking medicine as directed, such as taking too much of a certain medicine. What increases the risk? The following factors may make you more likely to develop this condition:  Age. Risk increases as you get older.  Conditions that affect the heart or the central nervous system.  Taking certain medicines, such as blood pressure medicine or diuretics.  Being pregnant. What are the signs or symptoms? Symptoms of this condition may include:  Weakness.  Light-headedness.  Dizziness.  Blurred vision.  Fatigue.  Rapid heartbeat.  Fainting, in severe cases. How is this diagnosed? This condition is diagnosed based on:  Your medical history.  Your symptoms.  Your blood pressure measurement. Your health care provider will check your blood pressure when you are: ? Lying down. ? Sitting. ? Standing. A blood pressure reading is recorded as two numbers, such as "120 over 80" (or 120/80). The first ("top") number is called the systolic pressure. It is a measure of the pressure in your arteries as your heart beats. The second ("bottom") number is called the diastolic pressure. It is a measure of the pressure in your arteries when your heart relaxes between beats. Blood pressure is measured in a unit called mm Hg. Healthy blood pressure for most adults is 120/80. If your blood pressure is below 90/60, you may be diagnosed with hypotension. Other information or tests that may be used to diagnose orthostatic hypotension include:  Your other vital signs, such as your heart rate and temperature.  Blood tests.  Tilt table test. For this test, you  will be safely secured to a table that moves you from a lying position to an upright position. Your heart rhythm and blood pressure will be monitored during the test. How is this treated? This condition may be treated by:  Changing your diet. This may involve eating more salt (sodium) or drinking more water.  Taking medicines to raise your blood  pressure.  Changing the dosage of certain medicines you are taking that might be lowering your blood pressure.  Wearing compression stockings. These stockings help to prevent blood clots and reduce swelling in your legs. In some cases, you may need to go to the hospital for:  Fluid replacement. This means you will receive fluids through an IV.  Blood replacement. This means you will receive donated blood through an IV (transfusion).  Treating an infection or heart problems, if this applies.  Monitoring. You may need to be monitored while medicines that you are taking wear off. Follow these instructions at home: Eating and drinking   Drink enough fluid to keep your urine pale yellow.  Eat a healthy diet, and follow instructions from your health care provider about eating or drinking restrictions. A healthy diet includes: ? Fresh fruits and vegetables. ? Whole grains. ? Lean meats. ? Low-fat dairy products.  Eat extra salt only as directed. Do not add extra salt to your diet unless your health care provider told you to do that.  Eat frequent, small meals.  Avoid standing up suddenly after eating. Medicines  Take over-the-counter and prescription medicines only as told by your health care provider. ? Follow instructions from your health care provider about changing the dosage of your current medicines, if this applies. ? Do not stop or adjust any of your medicines on your own. General instructions   Wear compression stockings as told by your health care provider.  Get up slowly from lying down or sitting positions. This gives your blood pressure a chance to adjust.  Avoid hot showers and excessive heat as directed by your health care provider.  Return to your normal activities as told by your health care provider. Ask your health care provider what activities are safe for you.  Do not use any products that contain nicotine or tobacco, such as cigarettes, e-cigarettes, and  chewing tobacco. If you need help quitting, ask your health care provider.  Keep all follow-up visits as told by your health care provider. This is important. Contact a health care provider if you:  Vomit.  Have diarrhea.  Have a fever for more than 2-3 days.  Feel more thirsty than usual.  Feel weak and tired. Get help right away if you:  Have chest pain.  Have a fast or irregular heartbeat.  Develop numbness in any part of your body.  Cannot move your arms or your legs.  Have trouble speaking.  Become sweaty or feel light-headed.  Faint.  Feel short of breath.  Have trouble staying awake.  Feel confused. Summary  Orthostatic hypotension is a sudden drop in blood pressure that happens when you quickly change positions.  Orthostatic hypotension is usually not a serious problem.  It is diagnosed by having your blood pressure taken lying down, sitting, and then standing.  It may be treated by changing your diet or adjusting your medicines. This information is not intended to replace advice given to you by your health care provider. Make sure you discuss any questions you have with your health care provider. Document Revised: 06/30/2017 Document Reviewed: 06/30/2017 Elsevier  Patient Education  2020 [place] is a type of arthritis that affects tissue that covers the ends of bones in joints (cartilage). Cartilage acts as a cushion between the bones and helps them move smoothly. Osteoarthritis results when cartilage in the joints gets worn down. Osteoarthritis is sometimes called "wear and tear" arthritis. Osteoarthritis is the most common form of arthritis. It often occurs in older people. It is a condition that gets worse over time (a progressive condition). Joints that are most often affected by this condition are in:  Fingers.  Toes.  Hips.  Knees.  Spine, including neck and lower back. What are the causes? This  condition is caused by age-related wearing down of cartilage that covers the ends of bones. What increases the risk? The following factors may make you more likely to develop this condition:  Older age.  Being overweight or obese.  Overuse of joints, such as in athletes.  Past injury of a joint.  Past surgery on a joint.  Family history of osteoarthritis. What are the signs or symptoms? The main symptoms of this condition are pain, swelling, and stiffness in the joint. The joint may lose its shape over time. Small pieces of bone or cartilage may break off and float inside of the joint, which may cause more pain and damage to the joint. Small deposits of bone (osteophytes) may grow on the edges of the joint. Other symptoms may include:  A grating or scraping feeling inside the joint when you move it.  Popping or creaking sounds when you move. Symptoms may affect one or more joints. Osteoarthritis in a major joint, such as your knee or hip, can make it painful to walk or exercise. If you have osteoarthritis in your hands, you might not be able to grip items, twist your hand, or control small movements of your hands and fingers (fine motor skills). How is this diagnosed? This condition may be diagnosed based on:  Your medical history.  A physical exam.  Your symptoms.  X-rays of the affected joint(s).  Blood tests to rule out other types of arthritis. How is this treated? There is no cure for this condition, but treatment can help to control pain and improve joint function. Treatment plans may include:  A prescribed exercise program that allows for rest and joint relief. You may work with a physical therapist.  A weight control plan.  Pain relief techniques, such as: ? Applying heat and cold to the joint. ? Electric pulses delivered to nerve endings under the skin (transcutaneous electrical nerve stimulation, or TENS). ? Massage. ? Certain nutritional supplements.  NSAIDs  or prescription medicines to help relieve pain.  Medicine to help relieve pain and inflammation (corticosteroids). This can be given by mouth (orally) or as an injection.  Assistive devices, such as a brace, wrap, splint, specialized glove, or cane.  Surgery, such as: ? An osteotomy. This is done to reposition the bones and relieve pain or to remove loose pieces of bone and cartilage. ? Joint replacement surgery. You may need this surgery if you have very bad (advanced) osteoarthritis. Follow these instructions at home: Activity  Rest your affected joints as directed by your health care provider.  Do not drive or use heavy machinery while taking prescription pain medicine.  Exercise as directed. Your health care provider or physical therapist may recommend specific types of exercise, such as: ? Strengthening exercises. These are done to strengthen the muscles that support joints that  are affected by arthritis. They can be performed with weights or with exercise bands to add resistance. ? Aerobic activities. These are exercises, such as brisk walking or water aerobics, that get your heart pumping. ? Range-of-motion activities. These keep your joints easy to move. ? Balance and agility exercises. Managing pain, stiffness, and swelling      If directed, apply heat to the affected area as often as told by your health care provider. Use the heat source that your health care provider recommends, such as a moist heat pack or a heating pad. ? If you have a removable assistive device, remove it as told by your health care provider. ? Place a towel between your skin and the heat source. If your health care provider tells you to keep the assistive device on while you apply heat, place a towel between the assistive device and the heat source. ? Leave the heat on for 20-30 minutes. ? Remove the heat if your skin turns bright red. This is especially important if you are unable to feel pain, heat, or  cold. You may have a greater risk of getting burned.  If directed, put ice on the affected joint: ? If you have a removable assistive device, remove it as told by your health care provider. ? Put ice in a plastic bag. ? Place a towel between your skin and the bag. If your health care provider tells you to keep the assistive device on during icing, place a towel between the assistive device and the bag. ? Leave the ice on for 20 minutes, 2-3 times a day. General instructions  Take over-the-counter and prescription medicines only as told by your health care provider.  Maintain a healthy weight. Follow instructions from your health care provider for weight control. These may include dietary restrictions.  Do not use any products that contain nicotine or tobacco, such as cigarettes and e-cigarettes. These can delay bone healing. If you need help quitting, ask your health care provider.  Use assistive devices as directed by your health care provider.  Keep all follow-up visits as told by your health care provider. This is important. Where to find more information  [employer] of Arthritis and Musculoskeletal and Skin Diseases: www.niams.South[employer].es  [employer] on Aging: http://kim-miller.com/  American College of Rheumatology: www.rheumatology.org Contact a health care provider if:  Your skin turns red.  You develop a rash.  You have pain that gets worse.  You have a fever along with joint or muscle aches. Get help right away if:  You lose a lot of weight.  You suddenly lose your appetite.  You have night sweats. Summary  Osteoarthritis is a type of arthritis that affects tissue covering the ends of bones in joints (cartilage).  This condition is caused by age-related wearing down of cartilage that covers the ends of bones.  The main symptom of this condition is pain, swelling, and stiffness in the joint.  There is no cure for this condition, but treatment can help to  control pain and improve joint function. This information is not intended to replace advice given to you by your health care provider. Make sure you discuss any questions you have with your health care provider. Document Revised: 12/17/2016 Document Reviewed: 09/08/2015 Elsevier Patient Education  2020 [employer].

## 2019-11-07 NOTE — Progress Notes (Signed)
She is  Subjective  CC:  Chief Complaint  Patient presents with  . Hypertension    150/80's BP checks at home, since starting new BP medication regimen at last visit - she has had several episodes of extreme weakness     HPI: Alexis Banks is a 74 y.o. female who presents to the office today to address the problems listed above in the chief complaint.  Hypertension f/u: Control is fair .  Patient is here due to wondering whether she is having problems with her blood pressure medications.  Last week she had diarrhea feeling mild body aches and weak, generalized.  No lightheadedness, dizziness, fevers or URI symptoms.  Symptoms lasted only about 24 hours.  Then over the weekend she had some neck pain, right shoulder pain and a dull headache.  She stopped the HCTZ from my suggestion and this is not changed her symptoms.  Tylenol does help her pain.  She has a history of a right rotator cuff surgery and felt that her shoulder pain stemming from that.  She denies double vision, slurred speech, paresis.   Assessment  1. Neck pain   2. Acute pain of right shoulder   3. Essential hypertension   4. Orthostatic hypotension      Plan    Hypertension f/u: BP control is fairly well controlled.  However she was orthostatic in the office.  This could be contributing to her symptoms.  We will stop the HCTZ.  Continue benazepril 40 mg daily and amlodipine 10 mg daily.  Goal blood pressure will be 140/90 to 150/90.  Neck and shoulder pain: Suspect osteoarthritis.  Patient defers x-ray imaging at this time.  Treat with Tylenol and/or Advil.  Follow-up if unimproved.  Do not think this is related to her blood pressure medications.  Reassured  See after visit summary Education regarding management of these chronic disease states was given. Management strategies discussed on successive visits include dietary and exercise recommendations, goals of achieving and maintaining IBW, and lifestyle  modifications aiming for adequate sleep and minimizing stressors.   Follow up: Return for as scheduled.  No orders of the defined types were placed in this encounter.  No orders of the defined types were placed in this encounter.     BP Readings from Last 3 Encounters:  10/19/19 (!) 150/90  09/10/19 (!) 170/100  06/05/19 (!) 148/90   Wt Readings from Last 3 Encounters:  11/07/19 165 lb 9.6 oz (75.1 kg)  10/19/19 168 lb (76.2 kg)  09/10/19 170 lb 3.2 oz (77.2 kg)    Lab Results  Component Value Date   CHOL 140 12/06/2018   CHOL 143 05/26/2017   CHOL 145 01/31/2014   Lab Results  Component Value Date   HDL 52.80 12/06/2018   HDL 52.60 05/26/2017   HDL 53.30 01/31/2014   Lab Results  Component Value Date   LDLCALC 52 12/06/2018   LDLCALC 70 05/26/2017   LDLCALC 66 01/31/2014   Lab Results  Component Value Date   TRIG 175.0 (H) 12/06/2018   TRIG 100.0 05/26/2017   TRIG 128.0 01/31/2014   Lab Results  Component Value Date   CHOLHDL 3 12/06/2018   CHOLHDL 3 05/26/2017   CHOLHDL 3 01/31/2014   Lab Results  Component Value Date   LDLDIRECT 112.6 11/07/2008   LDLDIRECT 136.6 01/09/2007   Lab Results  Component Value Date   CREATININE 0.86 03/21/2019   BUN 17 03/21/2019   NA 140 03/21/2019   K  4.5 03/21/2019   CL 105 03/21/2019   CO2 28 03/21/2019    The 10-year ASCVD risk score [name], et al., 2013) is: 27%   Values used to calculate the score:     Age: 38 years     Sex: Female     Is Non-Hispanic African American: No     Diabetic: No     Tobacco smoker: No     Systolic Blood Pressure: 093 mmHg     Is BP treated: Yes     HDL Cholesterol: 52.8 mg/dL     Total Cholesterol: 140 mg/dL  I reviewed the patients updated PMH, FH, and SocHx.    Patient Active Problem List   Diagnosis Date Noted  . Obesity (BMI 30-39.9) 11/10/2012    Priority: High  . Acquired hypothyroidism 01/09/2007    Priority: High  . Mixed hyperlipidemia 01/09/2007     Priority: High  . Essential hypertension 01/09/2007    Priority: High  . Irritable bowel syndrome with diarrhea 06/17/2015    Priority: Medium  . Osteopenia 09/10/2014    Priority: Medium  . OAB (overactive bladder) 02/24/2012    Priority: Medium  . GERD 03/27/2009    Priority: Medium  . Primary insomnia 03/02/2007    Priority: Medium  . 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 Uses hearing aid 12/22/2015    Priority: Low    Allergies: Adhesive [tape]  Social History: Patient  reports that she has never smoked. She has never used smokeless tobacco. She reports that she does not drink alcohol and does not use drugs.  Current Meds  Medication Sig  . amLODipine (NORVASC) 10 MG tablet Take 1 tablet (10 mg total) by mouth daily.  Marland Kitchen aspirin 81 MG tablet Take 81 mg by mouth daily.    . benazepril (LOTENSIN) 40 MG tablet Take 1 tablet (40 mg total) by mouth daily.  . Calcium Carb-Cholecalciferol (CALCIUM-VITAMIN D3) 600-500 MG-UNIT CAPS Take 1 capsule by mouth daily.  Marland Kitchen ELDERBERRY PO Take by mouth.  . levothyroxine (SYNTHROID) 125 MCG tablet TAKE 1 TABLET BY MOUTH  DAILY BEFORE BREAKFAST  . lovastatin (MEVACOR) 40 MG tablet TAKE 1 TABLET BY MOUTH AT  BEDTIME  . Multiple Vitamin (MULTIVITAMIN) tablet Take 1 tablet by mouth daily.    . propranolol (INDERAL) 20 MG tablet TAKE 1 TABLET BY MOUTH  TWICE DAILY  . Zinc Sulfate (ZINC 15 PO) Take by mouth.  . zolpidem (AMBIEN) 10 MG tablet Take 1 tablet (10 mg total) by mouth at bedtime as needed. for sleep  . [DISCONTINUED] benazepril (LOTENSIN) 20 MG tablet TAKE 1 TABLET BY MOUTH  DAILY  . [DISCONTINUED] hydrochlorothiazide (HYDRODIURIL) 25 MG tablet Take 1 tablet (25 mg total) by mouth daily.    Review of Systems: Cardiovascular: negative for chest pain, palpitations, leg swelling, orthopnea Respiratory: negative for SOB, wheezing or persistent cough Gastrointestinal: negative for abdominal pain Genitourinary: negative  for dysuria or gross hematuria  Objective  Vitals: Temp (!) 97.2 F (36.2 C) (Temporal)   Wt 165 lb 9.6 oz (75.1 kg)   SpO2 98%   BMI 29.33 kg/m   Blood pressure dropped from 1 38-1 15 upon standing.  Heart rate was in the 80s and stable.  Patient was slightly lightheaded General: no acute distress  Psych:  Alert and oriented, normal mood and affect HEENT:  Normocephalic, atraumatic, supple neck  Cardiovascular:  RRR without murmur. no edema Respiratory:  Good breath sounds bilaterally,  CTAB with normal respiratory effort Skin:  Warm, no rashes Neurologic:   Mental status is normal  Commons side effects, risks, benefits, and alternatives for medications and treatment plan prescribed today were discussed, and the patient expressed understanding of the given instructions. Patient is instructed to call or message via MyChart if he/she has any questions or concerns regarding our treatment plan. No barriers to understanding were identified. We discussed Red Flag symptoms and signs in detail. Patient expressed understanding regarding what to do in case of urgent or emergency type symptoms.   Medication list was reconciled, printed and provided to the patient in AVS. Patient instructions and summary information was reviewed with the patient as documented in the AVS. This note was prepared with assistance of Dragon voice recognition software. Occasional wrong-word or sound-a-like substitutions may have occurred due to the inherent limitations of voice recognition software  This visit occurred during the SARS-CoV-2 public health emergency.  Safety protocols were in place, including screening questions prior to the visit, additional usage of staff PPE, and extensive cleaning of exam room while observing appropriate contact time as indicated for disinfecting solutions.

## 2019-11-15 MED FILL — ZOLPIDEM TARTRATE 10 MG TAB: 10 | 30 days supply | Qty: 30 | Fill #0

## 2019-12-10 MED FILL — AMLODIPINE BESYLATE 10 MG T: 10 | 90 days supply | Qty: 90 | Fill #1

## 2019-12-11 ENCOUNTER — Encounter: Payer: Self-pay | Admitting: Family Medicine

## 2019-12-11 ENCOUNTER — Ambulatory Visit (INDEPENDENT_AMBULATORY_CARE_PROVIDER_SITE_OTHER): Payer: Medicare Other | Admitting: Family Medicine

## 2019-12-11 ENCOUNTER — Other Ambulatory Visit: Payer: Self-pay

## 2019-12-11 VITALS — BP 112/70 | HR 84 | Temp 98.1°F | Ht 63.0 in | Wt 163.0 lb

## 2019-12-11 DIAGNOSIS — Z0000 Encounter for general adult medical examination without abnormal findings: Secondary | ICD-10-CM

## 2019-12-11 DIAGNOSIS — F5101 Primary insomnia: Secondary | ICD-10-CM | POA: Diagnosis not present

## 2019-12-11 DIAGNOSIS — I10 Essential (primary) hypertension: Secondary | ICD-10-CM

## 2019-12-11 DIAGNOSIS — E782 Mixed hyperlipidemia: Secondary | ICD-10-CM | POA: Diagnosis not present

## 2019-12-11 DIAGNOSIS — E039 Hypothyroidism, unspecified: Secondary | ICD-10-CM | POA: Diagnosis not present

## 2019-12-11 DIAGNOSIS — M8580 Other specified disorders of bone density and structure, unspecified site: Secondary | ICD-10-CM

## 2019-12-11 LAB — CBC WITH DIFFERENTIAL/PLATELET
Absolute Monocytes: 671 cells/uL (ref 200–950)
Basophils Absolute: 26 cells/uL (ref 0–200)
Basophils Relative: 0.3 %
Eosinophils Absolute: 224 cells/uL (ref 15–500)
Eosinophils Relative: 2.6 %
HCT: 44.1 % (ref 35.0–45.0)
Hemoglobin: 14.5 g/dL (ref 11.7–15.5)
Lymphs Abs: 2907 cells/uL (ref 850–3900)
MCH: 29.8 pg (ref 27.0–33.0)
MCHC: 32.9 g/dL (ref 32.0–36.0)
MCV: 90.6 fL (ref 80.0–100.0)
MPV: 11.3 fL (ref 7.5–12.5)
Monocytes Relative: 7.8 %
Neutro Abs: 4773 cells/uL (ref 1500–7800)
Neutrophils Relative %: 55.5 %
Platelets: 319 10*3/uL (ref 140–400)
RBC: 4.87 10*6/uL (ref 3.80–5.10)
RDW: 13.6 % (ref 11.0–15.0)
Total Lymphocyte: 33.8 %
WBC: 8.6 10*3/uL (ref 3.8–10.8)

## 2019-12-11 LAB — COMPLETE METABOLIC PANEL WITH GFR
AG Ratio: 1.5 (calc) (ref 1.0–2.5)
ALT: 15 U/L (ref 6–29)
AST: 20 U/L (ref 10–35)
Albumin: 4.4 g/dL (ref 3.6–5.1)
Alkaline phosphatase (APISO): 93 U/L (ref 37–153)
BUN/Creatinine Ratio: 22 (calc) (ref 6–22)
BUN: 23 mg/dL (ref 7–25)
CO2: 26 mmol/L (ref 20–32)
Calcium: 10.5 mg/dL — ABNORMAL HIGH (ref 8.6–10.4)
Chloride: 104 mmol/L (ref 98–110)
Creat: 1.03 mg/dL — ABNORMAL HIGH (ref 0.60–0.93)
GFR, Est African American: 62 mL/min/{1.73_m2} (ref >=60)
GFR, Est Non African American: 53 mL/min/{1.73_m2} — ABNORMAL LOW (ref >=60)
Globulin: 3 g/dL (calc) (ref 1.9–3.7)
Glucose, Bld: 105 mg/dL — ABNORMAL HIGH (ref 65–99)
Potassium: 5.1 mmol/L (ref 3.5–5.3)
Sodium: 140 mmol/L (ref 135–146)
Total Bilirubin: 0.7 mg/dL (ref 0.2–1.2)
Total Protein: 7.4 g/dL (ref 6.1–8.1)

## 2019-12-11 LAB — LIPID PANEL
Cholesterol: 135 mg/dL (ref <200)
HDL: 57 mg/dL (ref >=50)
LDL Cholesterol (Calc): 58 mg/dL (calc)
Non-HDL Cholesterol (Calc): 78 mg/dL (calc) (ref <130)
Total CHOL/HDL Ratio: 2.4 (calc) (ref <5.0)
Triglycerides: 123 mg/dL (ref <150)

## 2019-12-11 LAB — TSH: TSH: 0.38 mIU/L — ABNORMAL LOW (ref 0.40–4.50)

## 2019-12-11 NOTE — Patient Instructions (Signed)
Please return in 6 months for hypertension follow up.  I will release your lab results to you on your MyChart account with further instructions. Please reply with any questions.   Things look good!!  If you have any questions or concerns, please don't hesitate to send me a message via MyChart or call the office at [phone]. Thank you for visiting with [place] today! It's our pleasure caring for you.

## 2019-12-11 NOTE — Progress Notes (Signed)
Subjective  Chief Complaint  Patient presents with  . Annual Exam    HPI: Alexis Banks is a 74 y.o. female who presents to [facility] [place] today for a Female Wellness Visit. She also has the concerns and/or needs as listed above in the chief complaint. These will be addressed in addition to the Health Maintenance Visit.   Wellness Visit: annual visit with health maintenance review and exam without Pap   HM: screens and imms are up to date. Feeling well! Chronic disease f/u and/or acute problem visit: (deemed necessary to be done in addition to the wellness visit):  HTN f/u: on propranolol, amlodipine and benazapril: no longer feeling lightheaded and home readings are stable again. Eating a low salt diet and has started walking regularly. No cp or sob. No leg swelling  Low thyroid: on levothyroxine and energy levels are good. No sxs of low or high thyroid  HLD on statin and tolerates well. Fasting for recheck.   Reviewed bone density dexa results. Stable osteopenia.   Insomnia: remains well controlled on ambien without adverse effects. I reviewed patient's records from the PMP aware controlled substance registry today.    Assessment  1. Annual physical exam   2. Essential hypertension   3. Acquired hypothyroidism   4. Mixed hyperlipidemia   5. Primary insomnia   6. Osteopenia, unspecified location      Plan  Female Wellness Visit:  Age appropriate Health Maintenance and Prevention measures were discussed with patient. Included topics are cancer screening recommendations, ways to keep healthy (see AVS) including dietary and exercise recommendations, regular eye and dental care, use of seat belts, and avoidance of moderate alcohol use and tobacco use.   BMI: discussed patient's BMI and encouraged positive lifestyle modifications to help get to or maintain a target BMI.  HM needs and immunizations were addressed and ordered. See below for orders.  See HM and immunization section for updates.  Routine labs and screening tests ordered including cmp, cbc and lipids where appropriate.  Discussed recommendations regarding Vit D and calcium supplementation (see AVS)  Chronic disease management visit and/or acute problem visit:  HTN is now well controlled. Check renal function and electrolytes.  HLD on statin for recheck  Check tsh and adjust meds if needed. Clinically euthyroid  Osteopenia: cont ca,D and exercise  ambien for sleep; aware of risks vs benefits.   Follow up: 6 mo for HTN f/u  Orders Placed This Encounter  Procedures  . CBC with Differential/Platelet  . COMPLETE METABOLIC PANEL WITH GFR  . Lipid panel  . TSH   No orders of the defined types were placed in this encounter.     Lifestyle: Body mass index is 28.87 kg/m. Wt Readings from Last 3 Encounters:  12/11/19 163 lb (73.9 kg)  11/07/19 165 lb 9.6 oz (75.1 kg)  10/19/19 168 lb (76.2 kg)    Patient Active Problem List   Diagnosis Date Noted  . Obesity (BMI 30-39.9) 11/10/2012    Priority: High  . Acquired hypothyroidism 01/09/2007    Priority: High  . Mixed hyperlipidemia 01/09/2007    Priority: High  . Essential hypertension 01/09/2007    Priority: High  . Irritable bowel syndrome with diarrhea 06/17/2015    Priority: Medium  . Osteopenia 09/10/2014    Priority: Medium    dexa 08/2019: osteopenia, T = -2.3, stable, recheck 2 years.    Marland Kitchen OAB (overactive bladder) 02/24/2012    Priority: Medium  .  GERD 03/27/2009    Priority: Medium  . Primary insomnia 03/02/2007    Priority: Medium  . 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 Uses hearing aid 12/22/2015    Priority: Low   Health Maintenance  Topic Date Due  . MAMMOGRAM  08/23/2020  . DEXA SCAN  08/23/2021  . COLONOSCOPY  09/20/2023  . TETANUS/TDAP  06/18/2025  . INFLUENZA VACCINE  Completed  . COVID-19 Vaccine  Completed  . Hepatitis C Screening  Completed  . PNA  vac Low Risk Adult  Completed   Immunization History  Administered Date(s) Administered  . Fluad Quad(high Dose 65+) 10/19/2019  . Influenza, High Dose Seasonal PF 10/07/2012, 10/15/2014, 10/15/2015, 10/26/2016, 10/13/2017  . Influenza,inj,Quad PF,6+ Mos 10/25/2013  . Influenza-Unspecified 10/14/2018  . PFIZER SARS-COV-2 Vaccination 02/07/2019, 02/28/2019  . Pneumococcal Conjugate-13 11/19/2014  . Pneumococcal Polysaccharide-23 08/09/2011, 10/28/2011  . Tdap 07/30/2008, 06/19/2015  . Zoster 09/09/2011   We updated and reviewed the patient's past history in detail and it is documented below. Allergies: Patient is allergic to adhesive [tape]. Past Medical History Patient  has a past medical history of Anemia, ANXIETY DEPRESSION, Common bile duct dilation, CORONARY ARTERY DISEASE, GERD, Hyperlipidemia, Hypertension, HYPOTHYROIDISM, INSOMNIA, ROTATOR CUFF SYNDROME, RIGHT, Trochanteric bursitis of left hip, VARICOSE VEINS, LOWER EXTREMITIES, and VITAMIN D DEFICIENCY. Past Surgical History Patient  has a past surgical history that includes Cholecystectomy and Rotator cuff surgery. Family History: Patient family history includes Alcohol abuse in her mother; Alzheimer's disease in her sister; COPD in her father. Social History:  Patient  reports that she has never smoked. She has never used smokeless tobacco. She reports that she does not drink alcohol and does not use drugs.  Review of Systems: Constitutional: negative for fever or malaise Ophthalmic: negative for photophobia, double vision or loss of vision Cardiovascular: negative for chest pain, dyspnea on exertion, or new LE swelling Respiratory: negative for SOB or persistent cough Gastrointestinal: negative for abdominal pain, change in bowel habits or melena Genitourinary: negative for dysuria or gross hematuria, no abnormal uterine bleeding or disharge Musculoskeletal: negative for new gait disturbance or muscular  weakness Integumentary: negative for new or persistent rashes, no breast lumps Neurological: negative for TIA or stroke symptoms Psychiatric: negative for SI or delusions Allergic/Immunologic: negative for hives  Patient Care Team    Relationship Specialty Notifications Start End  Leamon Arnt, MD PCP - General Family Medicine  12/23/16   Lafayette Dragon, MD (Inactive) Consulting Physician Gastroenterology  07/30/13   Lelon Perla, MD Consulting Physician Cardiology  07/30/13   Wallene Huh, DPM Consulting Physician Podiatry  05/26/17   Latanya Maudlin, MD Consulting Physician Orthopedic Surgery  05/26/17   Jola Schmidt, MD Consulting Physician Ophthalmology  10/13/18   [facility], [place] Physician Audiology  10/13/18     Objective  Vitals: Temp 98.1 F (36.7 C) (Temporal)   Ht 5\' 3"  (1.6 m)   Wt 163 lb (73.9 kg)   BMI 28.87 kg/m  General:  Well developed, well nourished, no acute distress  Psych:  Alert and orientedx3,normal mood and affect HEENT:  Normocephalic, atraumatic, non-icteric sclera,  supple neck without adenopathy, mass or thyromegaly Cardiovascular:  Normal S1, S2, RRR without gallop, rub or murmur Respiratory:  Good breath sounds bilaterally, CTAB with normal respiratory effort Gastrointestinal: normal bowel sounds, soft, non-tender, no noted masses. No HSM MSK: no deformities, contusions. Joints are without erythema or swelling.  Skin:  Warm, no rashes  or suspicious lesions noted Neurologic:    Mental status is normal. CN 2-11 are normal. Gross motor and sensory exams are normal. Normal gait. No tremor Breast Exam: No mass, skin retraction or nipple discharge is appreciated in either breast. No axillary adenopathy. Fibrocystic changes are not noted    Commons side effects, risks, benefits, and alternatives for medications and treatment plan prescribed today were discussed, and the patient expressed understanding of the given instructions.  Patient is instructed to call or message via MyChart if he/she has any questions or concerns regarding our treatment plan. No barriers to understanding were identified. We discussed Red Flag symptoms and signs in detail. Patient expressed understanding regarding what to do in case of urgent or emergency type symptoms.   Medication list was reconciled, printed and provided to the patient in AVS. Patient instructions and summary information was reviewed with the patient as documented in the AVS. This note was prepared with assistance of Dragon voice recognition software. Occasional wrong-word or sound-a-like substitutions may have occurred due to the inherent limitations of voice recognition software  This visit occurred during the SARS-CoV-2 public health emergency.  Safety protocols were in place, including screening questions prior to the visit, additional usage of staff PPE, and extensive cleaning of exam room while observing appropriate contact time as indicated for disinfecting solutions.

## 2019-12-17 MED FILL — ZOLPIDEM TARTRATE 10 MG TAB: 10 | 30 days supply | Qty: 30 | Fill #1

## 2019-12-18 ENCOUNTER — Other Ambulatory Visit: Payer: Self-pay

## 2019-12-18 DIAGNOSIS — E039 Hypothyroidism, unspecified: Secondary | ICD-10-CM

## 2019-12-31 ENCOUNTER — Other Ambulatory Visit: Payer: Medicare Other

## 2019-12-31 ENCOUNTER — Other Ambulatory Visit: Payer: Self-pay

## 2019-12-31 DIAGNOSIS — E039 Hypothyroidism, unspecified: Secondary | ICD-10-CM

## 2020-01-01 ENCOUNTER — Other Ambulatory Visit: Payer: Medicare Other

## 2020-01-01 ENCOUNTER — Other Ambulatory Visit: Payer: Self-pay | Admitting: Family Medicine

## 2020-01-01 LAB — PTH, INTACT (ICMA) AND IONIZED CALCIUM
Calcium, Ion: 5.1 mg/dL (ref 4.8–5.6)
Calcium: 9.6 mg/dL (ref 8.6–10.4)
PTH: 60 pg/mL (ref 14–64)

## 2020-01-01 LAB — TSH: TSH: 0.11 mIU/L — ABNORMAL LOW (ref 0.40–4.50)

## 2020-01-07 ENCOUNTER — Telehealth: Payer: Self-pay

## 2020-01-07 NOTE — Telephone Encounter (Signed)
Pt is following up on labs

## 2020-01-08 NOTE — Telephone Encounter (Signed)
Please advise. Patient's TSH is low

## 2020-01-08 NOTE — Telephone Encounter (Signed)
LMOVM advising patient that PCP is going to lower dose. Will send in new script tomorrow for her.

## 2020-01-08 NOTE — Telephone Encounter (Signed)
Patient is calling in asking if another provider is able to read her lab results, she is very concerned and is willing to wait until Dr.Andy gets back next week but does not want to unless she absolutely has to.

## 2020-01-09 ENCOUNTER — Other Ambulatory Visit: Payer: Self-pay

## 2020-01-09 MED ORDER — LEVOTHYROXINE SODIUM 112 MCG PO TABS
112.0000 ug | ORAL_TABLET | Freq: Every day | ORAL | 3 refills | Status: DC
Start: 2020-01-09 — End: 2020-02-22

## 2020-01-09 NOTE — Progress Notes (Signed)
Please call patient: I have reviewed his/her lab results. Calcium levels are normal now.  Need to decrease levothyroxine to 126mcg daily, down from 150mcg daily. Recheck with lab visit in 6-8 weeks. Please order and schedule. Thank you.

## 2020-01-10 ENCOUNTER — Other Ambulatory Visit: Payer: Self-pay

## 2020-01-10 DIAGNOSIS — E039 Hypothyroidism, unspecified: Secondary | ICD-10-CM

## 2020-01-15 MED FILL — ZOLPIDEM TARTRATE 10 MG TAB: 10 | 30 days supply | Qty: 30 | Fill #2

## 2020-02-11 MED FILL — ZOLPIDEM TARTRATE 10 MG TAB: 10 | 30 days supply | Qty: 30 | Fill #3

## 2020-02-19 ENCOUNTER — Other Ambulatory Visit (INDEPENDENT_AMBULATORY_CARE_PROVIDER_SITE_OTHER): Payer: Medicare Other

## 2020-02-19 ENCOUNTER — Other Ambulatory Visit: Payer: Self-pay

## 2020-02-19 DIAGNOSIS — E039 Hypothyroidism, unspecified: Secondary | ICD-10-CM | POA: Diagnosis not present

## 2020-02-19 LAB — TSH: TSH: 7.84 u[IU]/mL — ABNORMAL HIGH (ref 0.35–4.50)

## 2020-02-19 NOTE — Addendum Note (Signed)
Addended by: Brandy Hale on: 02/19/2020 09:29 AM   Modules accepted: Orders

## 2020-02-20 ENCOUNTER — Other Ambulatory Visit: Payer: Medicare Other

## 2020-02-22 ENCOUNTER — Other Ambulatory Visit: Payer: Self-pay

## 2020-02-22 DIAGNOSIS — E039 Hypothyroidism, unspecified: Secondary | ICD-10-CM

## 2020-02-22 MED ORDER — LEVOTHYROXINE SODIUM 125 MCG PO TABS: 125.0000 ug | ORAL_TABLET | Freq: Every day | ORAL | 0 refills | Status: DC

## 2020-02-22 MED ORDER — LEVOTHYROXINE SODIUM 112 MCG PO TABS
112.0000 ug | ORAL_TABLET | Freq: Every day | ORAL | 3 refills | Status: DC
Start: 2020-02-22 — End: 2020-02-24

## 2020-02-23 ENCOUNTER — Encounter: Payer: Self-pay | Admitting: Family Medicine

## 2020-02-24 MED ORDER — LEVOTHYROXINE SODIUM 125 MCG PO TABS: ORAL_TABLET | ORAL | 0 refills | Status: DC

## 2020-02-24 MED ORDER — LEVOTHYROXINE SODIUM 112 MCG PO TABS
ORAL_TABLET | ORAL | 3 refills | Status: DC
Start: 2020-02-24 — End: 2020-08-29

## 2020-02-29 ENCOUNTER — Telehealth: Payer: Self-pay

## 2020-02-29 NOTE — Telephone Encounter (Signed)
[facility] is calling in asking for clarification on levothyroxine (SYNTHROID) 112 MCG tablet.

## 2020-03-03 NOTE — Telephone Encounter (Signed)
Spoke with pharmacist  to clarify

## 2020-03-06 ENCOUNTER — Other Ambulatory Visit: Payer: Self-pay

## 2020-03-10 MED FILL — AMLODIPINE BESYLATE 10 MG T: 10 | 90 days supply | Qty: 90 | Fill #2

## 2020-03-14 MED FILL — ZOLPIDEM TARTRATE 10 MG TAB: 10 | 30 days supply | Qty: 30 | Fill #4

## 2020-04-08 ENCOUNTER — Other Ambulatory Visit (HOSPITAL_BASED_OUTPATIENT_CLINIC_OR_DEPARTMENT_OTHER): Payer: Self-pay

## 2020-04-12 MED FILL — ZOLPIDEM TARTRATE 10 MG TAB: 10 | 30 days supply | Qty: 30 | Fill #5

## 2020-05-01 ENCOUNTER — Telehealth: Payer: Self-pay | Admitting: Family Medicine

## 2020-05-01 NOTE — Chronic Care Management (AMB) (Signed)
  Chronic Care Management   Note  05/01/2020 Name: Alexis Banks MRN: [id number] DOB: Jul 10, 1945  Alexis Banks is a 75 y.o. year old female who is a primary care patient of Leamon Arnt, MD. I reached out to Doristine Johns by phone today in response to a referral sent by Alexis Banks's PCP, Leamon Arnt, MD.   Alexis Banks was given information about Chronic Care Management services today including:  1. CCM service includes personalized support from designated clinical staff supervised by her physician, including individualized plan of care and coordination with other care providers 2. 24/7 contact phone numbers for assistance for urgent and routine care needs. 3. Service will only be billed when office clinical staff spend 20 minutes or more in a month to coordinate care. 4. Only one practitioner may furnish and bill the service in a calendar month. 5. The patient may stop CCM services at any time (effective at the end of the month) by phone call to the office staff.   Patient agreed to services and verbal consent obtained.   Follow up plan:   Lauretta Grill Upstream Scheduler

## 2020-05-01 NOTE — Chronic Care Management (AMB) (Signed)
  Chronic Care Management   Outreach Note  05/01/2020 Name: Alexis Banks MRN: [id number] DOB: 09/12/1945  Referred by: Leamon Arnt, MD Reason for referral : No chief complaint on file.   An unsuccessful telephone outreach was attempted today. The patient was referred to the pharmacist for assistance with care management and care coordination.   Follow Up Plan:   Lauretta Grill Upstream Scheduler

## 2020-05-13 ENCOUNTER — Ambulatory Visit (INDEPENDENT_AMBULATORY_CARE_PROVIDER_SITE_OTHER): Payer: Medicare Other

## 2020-05-13 ENCOUNTER — Telehealth: Payer: Self-pay

## 2020-05-13 DIAGNOSIS — E782 Mixed hyperlipidemia: Secondary | ICD-10-CM | POA: Diagnosis not present

## 2020-05-13 DIAGNOSIS — F5101 Primary insomnia: Secondary | ICD-10-CM

## 2020-05-13 NOTE — Progress Notes (Signed)
Chronic Care Management Pharmacy Note  05/13/2020 Name:  Alexis Banks  MRN:  [id number] DOB:  04-13-1945  Subjective: Alexis Banks is an 75 y.o. year old female who is a primary patient of Leamon Arnt, MD.  The CCM team was consulted for assistance with disease management and care coordination needs.    Engaged with patient by telephone for initial visit in response to provider referral for pharmacy case management and/or care coordination services.   Consent to Services:  The patient was given the following information about Chronic Care Management services today, agreed to services, and gave verbal consent: 1. CCM service includes personalized support from designated clinical staff supervised by the primary care provider, including individualized plan of care and coordination with other care providers 2. 24/7 contact phone numbers for assistance for urgent and routine care needs. 3. Service will only be billed when office clinical staff spend 20 minutes or more in a month to coordinate care. 4. Only one practitioner may furnish and bill the service in a calendar month. 5.The patient may stop CCM services at any time (effective at the end of the month) by phone call to the office staff. 6. The patient will be responsible for cost sharing (co-pay) of up to 20% of the service fee (after annual deductible is met). Patient agreed to services and consent obtained.  Patient Care Team: Leamon Arnt, MD as PCP - General (Family Medicine) Lafayette Dragon, MD (Inactive) as Consulting Physician (Gastroenterology) Stanford Breed, Denice Bors, MD as Consulting Physician (Cardiology) Regal, Tamala Fothergill, DPM as Consulting Physician (Podiatry) Latanya Maudlin, MD as Consulting Physician (Orthopedic Surgery) Jola Schmidt, MD as Consulting Physician (Ophthalmology) [facility] [place] as Consulting Physician (Audiology) Madelin Rear, [facility] as Pharmacist (Pharmacist)  Recent office  visits: 12/11/2019 (PCP): FBG elevated. calcium is elevated a little and thyroid is off again. Please schedule OV in 2-4 weeks for PTH w/ ionized calcium (DX: hypercalcemia) and TSH (dx: hypothyroidism).   Hospital visits: None in previous 6 months  Objective:  Lab Results  Component Value Date   CREATININE 1.03 (H) 12/11/2019   CREATININE 0.86 03/21/2019   CREATININE 0.85 12/06/2018    Lab Results  Component Value Date   HGBA1C 6.1 (H) 12/26/2011   Last diabetic Eye exam: No results found for: HMDIABEYEEXA  Last diabetic Foot exam: No results found for: HMDIABFOOTEX      Component Value Date/Time   CHOL 135 12/11/2019 0826   TRIG 123 12/11/2019 0826   HDL 57 12/11/2019 0826   CHOLHDL 2.4 12/11/2019 0826   VLDL 35.0 12/06/2018 0832   LDLCALC 58 12/11/2019 0826   LDLDIRECT 112.6 11/07/2008 1022    Hepatic Function Latest Ref Rng & Units 12/11/2019 12/06/2018 08/12/2017  Total Protein 6.1 - 8.1 g/dL 7.4 7.4 6.9  Albumin 3.5 - 5.2 g/dL - 4.5 4.0  AST 10 - 35 U/L _0 ALT 6 - 29 U/L _1 Alk Phosphatase 39 - 117 U/L - 86 82  Total Bilirubin 0.2 - 1.2 mg/dL 0.7 0.7 0.7  Bilirubin, Direct 0.0 - 0.3 mg/dL - - -    Lab Results  Component Value Date/Time   TSH 7.84 (H) 02/19/2020 09:29 AM   TSH 0.11 (L) 12/31/2019 08:16 AM   FREET4 1.3 06/10/2008 07:59 AM    CBC Latest Ref Rng & Units 12/11/2019 12/06/2018 05/26/2017  WBC 3.8 - 10.8 Thousand/uL 8.6 6.0 5.1  Hemoglobin 11.7 - 15.5 g/dL 14.5  13.0 12.2  Hematocrit 35.0 - 45.0 % 44.1 40.0 37.9  Platelets 140 - 400 Thousand/uL 319 263.0 279.0    Lab Results  Component Value Date/Time   VD25OH 24.32 07/30/2013 09:11 AM   VD25OH 25 (L) 08/09/2011 09:47 AM   VD25OH 20 (L) 07/21/2010 12:22 PM    Clinical ASCVD:  The 10-year ASCVD risk score [name], et al., 2013) is: 18%   Values used to calculate the score:     Age: 39 years     Sex: Female     Is Non-Hispanic African American: No     Diabetic: No      Tobacco smoker: No     Systolic Blood Pressure: 638 mmHg     Is BP treated: Yes     HDL Cholesterol: 57 mg/dL     Total Cholesterol: 135 mg/dL    DEXA 08/2019 - planned for 2 yr recheck  Site Region Measured Date Measured Age YA BMD Significant CHANGE T-score AP Spine  L2-L3      08/24/2019    75.5         -1.9    0.967 g/cm2 DualFemur Neck Left  08/24/2019    75.5         -2.3    0.721 g/cm2 DualFemur Total Mean 08/24/2019    75.5         -1.9    0.768 g/cm2 Major/hip 14.6%/4.2%   Social History   Tobacco Use  Smoking Status Never Smoker  Smokeless Tobacco Never Used   BP Readings from Last 3 Encounters:  12/11/19 112/70  10/19/19 (!) 150/90  09/10/19 (!) 170/100   Pulse Readings from Last 3 Encounters:  12/11/19 84  10/19/19 70  09/10/19 (!) 57   Wt Readings from Last 3 Encounters:  12/11/19 163 lb (73.9 kg)  11/07/19 165 lb 9.6 oz (75.1 kg)  10/19/19 168 lb (76.2 kg)    Assessment: Review of patient past medical history, allergies, medications, health status, including review of consultants reports, laboratory and other test data, was performed as part of comprehensive evaluation and provision of chronic care management services.   SDOH:  (Social Determinants of Health) assessments and interventions performed: Yes   CCM Care Plan  Allergies  Allergen Reactions  . Adhesive [Tape] Rash    Rubbery tape - blisters    Medications Reviewed Today    Reviewed by Madelin Rear, [facility] (Pharmacist) on 05/13/20 at 1138  Med List Status: <None>  Medication Order Taking? Sig Documenting Provider Last Dose Status Informant  amLODipine (NORVASC) 10 MG tablet [id number] Yes TAKE 1 TABLET BY MOUTH DAILY. Leamon Arnt, MD Taking Active   aspirin 81 MG tablet [id number] Yes Take 81 mg by mouth daily. [provider] Taking Active Self  benazepril (LOTENSIN) 20 MG tablet [id number] Yes Take 20 mg by mouth daily. [provider] Taking Active   benazepril (LOTENSIN) 40  MG tablet [id number] No Take 1 tablet (40 mg total) by mouth daily.  Patient not taking: Reported on 05/13/2020   Leamon Arnt, MD Not Taking Active   Calcium Carb-Cholecalciferol (CALCIUM-VITAMIN D3) 600-500 MG-UNIT CAPS [id number] Yes Take 1 capsule by mouth daily. [provider] Taking Active Self        Discontinued 03/25/11 1546 (Error)   ELDERBERRY PO [id number]  Take by mouth. [provider]  Active         Discontinued 11/07/19 0849   levothyroxine (SYNTHROID) 112 MCG tablet [id number] Yes Take  1 tablet (112 mcg) by mouth once daily on Saturday and Sunday Leamon Arnt, MD Taking Active   levothyroxine (SYNTHROID) 125 MCG tablet [id number] Yes Take 1 tablet (125 mcg) by mouth once daily on Monday -Friday Leamon Arnt, MD Taking Active   lovastatin (MEVACOR) 40 MG tablet [id number] Yes TAKE 1 TABLET BY MOUTH AT  BEDTIME Leamon Arnt, MD Taking Active   Multiple Vitamin (MULTIVITAMIN) tablet [id number] Yes Take 1 tablet by mouth daily. [provider] Taking Active Self  propranolol (INDERAL) 20 MG tablet [id number] Yes TAKE 1 TABLET BY MOUTH  TWICE DAILY Leamon Arnt, MD Taking Active   Zinc Sulfate (ZINC 15 PO) [id number] Yes Take by mouth. [provider] Taking Active   zolpidem (AMBIEN) 10 MG tablet [id number]  TAKE 1 TABLET BY MOUTH AT BEDTIME AS NEEDED FOR SLEEP Leamon Arnt, MD  Expired 04/16/20 2359          Patient Active Problem List   Diagnosis Date Noted  . 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 Uses hearing aid 12/22/2015  . Irritable bowel syndrome with diarrhea 06/17/2015  . Osteopenia 09/10/2014  . Obesity (BMI 30-39.9) 11/10/2012  . OAB (overactive bladder) 02/24/2012  . GERD 03/27/2009  . Primary insomnia 03/02/2007  . Acquired hypothyroidism 01/09/2007  . Mixed hyperlipidemia 01/09/2007  . Essential hypertension 01/09/2007    Immunization History  Administered Date(s) Administered  . Fluad Quad(high Dose 65+)  10/19/2019  . Influenza, High Dose Seasonal PF 10/07/2012, 10/15/2014, 10/15/2015, 10/26/2016, 10/13/2017  . Influenza,inj,Quad PF,6+ Mos 10/25/2013  . Influenza-Unspecified 10/14/2018  . PFIZER(Purple Top)SARS-COV-2 Vaccination 02/07/2019, 02/28/2019  . Pneumococcal Conjugate-13 11/19/2014  . Pneumococcal Polysaccharide-23 08/09/2011, 10/28/2011  . Tdap 07/30/2008, 06/19/2015  . Zoster 09/09/2011    Conditions to be addressed/monitored: HTN HLD Osteopenia Hyperglycemia Insomnia GERD IBS OAB Obesity Hypothyroidism  Care Plan : [place]  Updates made by Madelin Rear, [facility] since 05/13/2020 12:00 AM    Problem: HTN HLD Osteopenia Hyperglycemia Insomnia GERD IBS OAB Obesity Hypothyroidism   Priority: High    Long-Range Goal: Disease Management   Start Date: 05/13/2020  Expected End Date: 05/13/2021  This Visit's Progress: On track  Priority: High  Note:   Current Barriers:  Marland Kitchen Maintenance of TSH levels and BP  Pharmacist Clinical Goal(s):  Marland Kitchen Patient will contact provider office for questions/concerns as evidenced notation of same in electronic health record through collaboration with PharmD and provider.   Interventions: . 1:1 collaboration with Leamon Arnt, MD regarding development and update of comprehensive plan of care as evidenced by provider attestation and co-signature . Inter-disciplinary care team collaboration (see longitudinal plan of care) . Comprehensive medication review performed; medication list updated in electronic medical record . No changes  Hypertension (BP goal <130/80) -Controlled -Current treatment: . Amlodipine 10 mg once daily  . Benazepril  20 mg daily  -Medications previously tried: benazepril 40 mg, HCTZ (felt sick)  -Current home readings: fluctuates, 315 systolic while on the phone today - will let us know if >150 moving forward -Denies hypotensive/hypertensive symptoms -Educated on BP goals and benefits of medications for  prevention of heart attack, stroke and kidney damage; -Counseled to monitor BP at home, document, and provide log at future appointments -Unclear if benazepril 40 mg should have been stopped (on 20 mg daily now) - potentially switch back to this strength next PCP visit 5/6 depending on BP  Hyperlipidemia: (LDL goal < 100) -Controlled -Primary prevention -Current treatment: .  Lovastatin 40 mg once daily -Current exercise habits: steps program 30 min walking 3x/week  - consistently meets.meat and two vegetables a night consistently. Bagel or toast for breakfast or yogurt and grapes. -Educated on Cholesterol goals;  -Recommended to continue current medication  Osteopenia (Goal maintain bone density, ensure supplementation) -Controlled -IBS -Last DEXA Scan: 2019  -Patient is not a candidate for pharmacologic treatment -Current treatment  . Vitamin D3 / calcium supplementation -Recommend weight-bearing and muscle strengthening exercises for building and maintaining bone density. -Recommended to continue current medication  Hypothyroidism (Goal: achieve normal TSH) -Not ideally controlled -Previous oversuppressed TSH -> highTSH -Current treatment  . Levothyroxine 125 mcg M-F, 112 mcg Sat-Sun -Reviewed proper admin, confirmed dosing schedule - no problems.  -Recommended to continue current medication  Insomnia (Goal: ensure safe medication use) -Controlled -Only goes to bed if tired -Current treatment  . Zolpidem 10 mg once every night at bedtime - at times  -Reviewed side effects - no problems noted. No recent falls. -Recommended to continue current medication  Patient Goals/Self-Care Activities . Patient will:  - take medications as prescribed target a minimum of 150 minutes of moderate intensity exercise weekly  Medication Assistance: None required.  Patient affirms current coverage meets needs. Patient's preferred pharmacy is:  [place], [place]  [address], [address] [place], [address] [place] [address] Phone: [phone] Fax: [phone]    Uses pill box? Yes. Pt endorses 100% compliance.  Future Appointments  Date Time Provider [place]  05/23/2020 10:30 AM Leamon Arnt, MD [facility]  10/14/2020  3:30 PM [facility] CCM PHARMACIST [facility]   Follow Up:  Patient agrees to Care Plan and Follow-up. Plan: [facility] f/u telephone call  Madelin Rear, Pharm.D., BCGP Clinical Pharmacist [place] [phone]

## 2020-05-13 NOTE — Progress Notes (Signed)
Chronic Care Management Pharmacy Assistant   Name: Alexis Banks  MRN: [id number] DOB: [birth year]  Alexis Banks is an 75 y.o. year old female who presents for his initial CCM visit with the clinical pharmacist.  Reason for Encounter: Chart Prep  Recent office visits:  12/11/19- Billey Chang, MD- annual physical exam, 6 months follow up  11/07/19- Billey Chang, MD- neck pain, stopped HCTZ, 10/01/21Billey Chang, MD- started HCTZ 25 mg, follow up 6 weeks Recent consult visits:  No visits noted  Hospital visits:  None in previous 6 months  Medications: Outpatient Encounter Medications as of 05/13/2020  Medication Sig  . amLODipine (NORVASC) 10 MG tablet TAKE 1 TABLET BY MOUTH DAILY.  Marland Kitchen aspirin 81 MG tablet Take 81 mg by mouth daily.    . benazepril (LOTENSIN) 20 MG tablet Take 20 mg by mouth daily.  . benazepril (LOTENSIN) 40 MG tablet Take 1 tablet (40 mg total) by mouth daily.  . Calcium Carb-Cholecalciferol (CALCIUM-VITAMIN D3) 600-500 MG-UNIT CAPS Take 1 capsule by mouth daily.  Marland Kitchen ELDERBERRY PO Take by mouth.  . levothyroxine (SYNTHROID) 112 MCG tablet Take 1 tablet (112 mcg) by mouth once daily on Saturday and Sunday  . levothyroxine (SYNTHROID) 125 MCG tablet Take 1 tablet (125 mcg) by mouth once daily on Monday -Friday  . lovastatin (MEVACOR) 40 MG tablet TAKE 1 TABLET BY MOUTH AT  BEDTIME  . Multiple Vitamin (MULTIVITAMIN) tablet Take 1 tablet by mouth daily.    . propranolol (INDERAL) 20 MG tablet TAKE 1 TABLET BY MOUTH  TWICE DAILY  . Zinc Sulfate (ZINC 15 PO) Take by mouth.  . zolpidem (AMBIEN) 10 MG tablet TAKE 1 TABLET BY MOUTH AT BEDTIME AS NEEDED FOR SLEEP  . [DISCONTINUED] colestipol (COLESTID) 1 G tablet Take 1 g by mouth daily.    . [DISCONTINUED] hydrochlorothiazide (HYDRODIURIL) 25 MG tablet Take 1 tablet (25 mg total) by mouth daily.   No facility-administered encounter medications on file as of 05/13/2020.    Current Documented  Medications Amlodipine 10 mg- 90 DS last filled 03/10/20  Asprin 81 mg - Benazepril 20 mg- 90 DS last filled 04/09/20 Calcium- Vitamin D3 [id number] mg U Levothyroxine 112 mcg- 90 DS 03/03/20 Sat and Sun  Levothyroxine 125 mcg- 90 DS last filled 03/24/20 Mon- Fri Lovastatin 40 mg- 90 DS last filled 04/09/20 Multivitamin Propranolol 20 mg- 90 DS last filled 04/10/19 Zinc Sulfate Zolpidem 10 mg- 02/11/20 30DS last filled 04/12/20  Have you seen any other providers since your last visit? No providers seen  Any changes in your medications or health?  Patient stated she has not had any changes In medications  Any side effects from any medications? Patient does not currently have side effects. She did inform me that she felt a bit sick in the past on HCTZ.  Do you have an symptoms or problems not managed by your medications?  Patient did not have any unmanaged symptoms  Any concerns about your health right now? Patient did not offer any concerns  Has your provider asked that you check blood pressure, blood sugar, or follow special diet at home?  Patient has an arm BP cuff and was asked in the past to check her BP regularly, but now she is only needing to check at times of extreme highs or lows.   Do you get any type of exercise on a regular basis?  Patient stated she does things like Vacuums everyday, wash clothes, and her step program.  She is retired so she 'does what she wants when she wants'  Can you think of a goal you would like to reach for your health? Patient did not offer any goals   Do you have any problems getting your medications?  Patient receives medications through [facility] and has no issues receiving medications, however she does have a problem with them overfilling unneeded medications  Is there anything that you would like to discuss during the appointment?  Patient did not have anything to discuss  Please bring medications and supplements to appointment  Wilford Sports CPA, [place]

## 2020-05-13 NOTE — Patient Instructions (Signed)
Alexis Banks,  Thank you for talking with me today. I have included our care plan/goals in the following pages.   Please review and call me at [phone] with any questions.  Thanks! Ellin Mayhew, Pharm.D., BCGP Clinical Pharmacist [facility] [place] [phone] Patient Care Plan: [place] Plan    Problem Identified: HTN HLD Osteopenia Hyperglycemia Insomnia GERD IBS OAB Obesity Hypothyroidism   Priority: High    Long-Range Goal: Disease Management   Start Date: 05/13/2020  Expected End Date: 05/13/2021  This Visit's Progress: On track  Priority: High  Note:   Current Barriers:  Marland Kitchen Maintenance of TSH levels and BP  Pharmacist Clinical Goal(s):  Marland Kitchen Patient will contact provider office for questions/concerns as evidenced notation of same in electronic health record through collaboration with PharmD and provider.   Interventions: . 1:1 collaboration with Leamon Arnt, MD regarding development and update of comprehensive plan of care as evidenced by provider attestation and co-signature . Inter-disciplinary care team collaboration (see longitudinal plan of care) . Comprehensive medication review performed; medication list updated in electronic medical record . No changes  Hypertension (BP goal <130/80) -Controlled -Current treatment: . Amlodipine 10 mg once daily  . Benazepril  20 mg daily  -Medications previously tried: benazepril 20 mg, HCTZ (felt sick)  -Current home readings: fluctuates, 937 systolic while on the phone today - will let us know if >150 moving forward -Denies hypotensive/hypertensive symptoms -Educated on BP goals and benefits of medications for prevention of heart attack, stroke and kidney damage; -Counseled to monitor BP at home, document, and provide log at future appointments -Recommended to continue current medication  Hyperlipidemia: (LDL goal < 100) -Controlled -Primary  prevention -Current treatment: . Lovastatin 40 mg once daily -Current exercise habits: steps program 30 min walking 3x/week  - consistently meets.meat and two vegetables a night consistently. Bagel or toast for breakfast or yogurt and grapes. -Educated on Cholesterol goals;  -Recommended to continue current medication  Osteopenia (Goal maintain bone density, ensure supplementation) -Controlled -IBS -Last DEXA Scan: 2019  -Patient is not a candidate for pharmacologic treatment -Current treatment  . Vitamin D3 / calcium supplementation -Recommend weight-bearing and muscle strengthening exercises for building and maintaining bone density. -Recommended to continue current medication  Hypothyroidism (Goal: achieve normal TSH) -Not ideally controlled -Previous oversuppressed TSH -> highTSH -Current treatment  . Levothyroxine 125 mcg M-F, 112 mcg Sat-Sun -Reviewed proper admin, confirmed dosing schedule - no problems.  -Recommended to continue current medication  Insomnia (Goal: ensure safe medication use) -Controlled -Only goes to bed if tired -Current treatment  . Zolpidem 10 mg once every night at bedtime - at times  -Reviewed side effects - no problems noted. No recent falls. -Recommended to continue current medication  Patient Goals/Self-Care Activities . Patient will:  - take medications as prescribed target a minimum of 150 minutes of moderate intensity exercise weekly  Medication Assistance: None required.  Patient affirms current coverage meets needs. Patient's preferred pharmacy is:  [facility], [address], [address] [place], [address] [place] [address] Phone: [phone] Fax: [phone]     The patient was given the following information about Chronic Care Management services today, agreed to services, and gave verbal consent: 1. CCM service includes personalized support from designated clinical staff supervised  by the primary care provider, including individualized plan of care and coordination with other care providers 2.  24/7 contact phone numbers for assistance for urgent and routine care needs. 3. Service will only be billed when office clinical staff spend 20 minutes or more in a month to coordinate care. 4. Only one practitioner may furnish and bill the service in a calendar month. 5.The patient may stop CCM services at any time (effective at the end of the month) by phone call to the office staff. 6. The patient will be responsible for cost sharing (co-pay) of up to 20% of the service fee (after annual deductible is met). Patient agreed to services and consent obtained.  The patient verbalized understanding of instructions provided today and agreed to receive a MyChart copy of patient instruction and/or educational materials. Telephone follow up appointment with pharmacy team member scheduled for: See next appointment with "Care Management Staff" under "What's Next" below.  High Cholesterol  High cholesterol is a condition in which the blood has high levels of a white, waxy substance similar to fat (cholesterol). The liver makes all the cholesterol that the body needs. The human body needs small amounts of cholesterol to help build cells. A person gets extra or excess cholesterol from the food that he or she eats. The blood carries cholesterol from the liver to the rest of the body. If you have high cholesterol, deposits (plaques) may build up on the walls of your arteries. Arteries are the blood vessels that carry blood away from your heart. These plaques make the arteries narrow and stiff. Cholesterol plaques increase your risk for heart attack and stroke. Work with your health care provider to keep your cholesterol levels in a healthy range. What increases the risk? The following factors may make you more likely to develop this condition:  Eating foods that are high in animal fat (saturated fat) or  cholesterol.  Being overweight.  Not getting enough exercise.  A family history of high cholesterol (familial hypercholesterolemia).  Use of tobacco products.  Having diabetes. What are the signs or symptoms? There are no symptoms of this condition. How is this diagnosed? This condition may be diagnosed based on the results of a blood test.  If you are older than 76 years of age, your health care provider may check your cholesterol levels every 4-6 years.  You may be checked more often if you have high cholesterol or other risk factors for heart disease. The blood test for cholesterol measures:  "Bad" cholesterol, or LDL cholesterol. This is the main type of cholesterol that causes heart disease. The desired level is less than 100 mg/dL.  "Good" cholesterol, or HDL cholesterol. HDL helps protect against heart disease by cleaning the arteries and carrying the LDL to the liver for processing. The desired level for HDL is 60 mg/dL or higher.  Triglycerides. These are fats that your body can store or burn for energy. The desired level is less than 150 mg/dL.  Total cholesterol. This measures the total amount of cholesterol in your blood and includes LDL, HDL, and triglycerides. The desired level is less than 200 mg/dL. How is this treated? This condition may be treated with:  Diet changes. You may be asked to eat foods that have more fiber and less saturated fats or added sugar.  Lifestyle changes. These may include regular exercise, maintaining a healthy weight, and quitting use of tobacco products.  Medicines. These are given when diet and lifestyle changes have not worked. You may be prescribed a statin medicine to help lower your cholesterol levels. Follow these instructions at home:  Eating and drinking  Eat a healthy, balanced diet. This diet includes: ? Daily servings of a variety of fresh, frozen, or canned fruits and vegetables. ? Daily servings of whole grain foods that  are rich in fiber. ? Foods that are low in saturated fats and trans fats. These include poultry and fish without skin, lean cuts of meat, and low-fat dairy products. ? A variety of fish, especially oily fish that contain omega-3 fatty acids. Aim to eat fish at least 2 times a week.  Avoid foods and drinks that have added sugar.  Use healthy cooking methods, such as roasting, grilling, broiling, baking, poaching, steaming, and stir-frying. Do not fry your food except for stir-frying.   Lifestyle  Get regular exercise. Aim to exercise for a total of 150 minutes a week. Increase your activity level by doing activities such as gardening, walking, and taking the stairs.  Do not use any products that contain nicotine or tobacco, such as cigarettes, e-cigarettes, and chewing tobacco. If you need help quitting, ask your health care provider.   General instructions  Take over-the-counter and prescription medicines only as told by your health care provider.  Keep all follow-up visits as told by your health care provider. This is important. Where to find more information  American Heart Association: www.heart.org  National Heart, Lung, and Blood Institute: https://wilson-eaton.com/ Contact a health care provider if:  You have trouble achieving or maintaining a healthy diet or weight.  You are starting an exercise program.  You are unable to stop smoking. Get help right away if:  You have chest pain.  You have trouble breathing.  You have any symptoms of a stroke. "BE FAST" is an easy way to remember the main warning signs of a stroke: ? B - Balance. Signs are dizziness, sudden trouble walking, or loss of balance. ? E - Eyes. Signs are trouble seeing or a sudden change in vision. ? F - Face. Signs are sudden weakness or numbness of the face, or the face or eyelid drooping on one side. ? A - Arms. Signs are weakness or numbness in an arm. This happens suddenly and usually on one side of the  body. ? S - Speech. Signs are sudden trouble speaking, slurred speech, or trouble understanding what people say. ? T - Time. Time to call emergency services. Write down what time symptoms started.  You have other signs of a stroke, such as: ? A sudden, severe headache with no known cause. ? Nausea or vomiting. ? Seizure. These symptoms may represent a serious problem that is an emergency. Do not wait to see if the symptoms will go away. Get medical help right away. Call your local emergency services (911 in the [place] Do not drive yourself to the hospital. Summary  Cholesterol plaques increase your risk for heart attack and stroke. Work with your health care provider to keep your cholesterol levels in a healthy range.  Eat a healthy, balanced diet, get regular exercise, and maintain a healthy weight.  Do not use any products that contain nicotine or tobacco, such as cigarettes, e-cigarettes, and chewing tobacco.  Get help right away if you have any symptoms of a stroke. This information is not intended to replace advice given to you by your health care provider. Make sure you discuss any questions you have with your health care provider. Document Revised: 12/04/2018 Document Reviewed: 12/04/2018 Elsevier Patient Education  2021 [employer].

## 2020-05-23 ENCOUNTER — Other Ambulatory Visit: Payer: Self-pay | Admitting: Family Medicine

## 2020-05-23 ENCOUNTER — Other Ambulatory Visit: Payer: Self-pay

## 2020-05-23 ENCOUNTER — Telehealth: Payer: Self-pay

## 2020-05-23 ENCOUNTER — Encounter: Payer: Self-pay | Admitting: Family Medicine

## 2020-05-23 ENCOUNTER — Other Ambulatory Visit (HOSPITAL_COMMUNITY): Payer: Self-pay

## 2020-05-23 ENCOUNTER — Ambulatory Visit (INDEPENDENT_AMBULATORY_CARE_PROVIDER_SITE_OTHER): Payer: Medicare Other | Admitting: Family Medicine

## 2020-05-23 VITALS — BP 140/82 | HR 75 | Temp 98.8°F | Wt 162.0 lb

## 2020-05-23 DIAGNOSIS — E039 Hypothyroidism, unspecified: Secondary | ICD-10-CM

## 2020-05-23 DIAGNOSIS — I10 Essential (primary) hypertension: Secondary | ICD-10-CM | POA: Diagnosis not present

## 2020-05-23 DIAGNOSIS — F5101 Primary insomnia: Secondary | ICD-10-CM | POA: Diagnosis not present

## 2020-05-23 LAB — COMPREHENSIVE METABOLIC PANEL
ALT: 16 U/L (ref 0–35)
AST: 20 U/L (ref 0–37)
Albumin: 4.4 g/dL (ref 3.5–5.2)
Alkaline Phosphatase: 93 U/L (ref 39–117)
BUN: 18 mg/dL (ref 6–23)
CO2: 26 mEq/L (ref 19–32)
Calcium: 10 mg/dL (ref 8.4–10.5)
Chloride: 106 mEq/L (ref 96–112)
Creatinine, Ser: 0.89 mg/dL (ref 0.40–1.20)
GFR: 63.04 mL/min (ref >60.00)
Glucose, Bld: 105 mg/dL — ABNORMAL HIGH (ref 70–99)
Potassium: 4.6 mEq/L (ref 3.5–5.1)
Sodium: 142 mEq/L (ref 135–145)
Total Bilirubin: 0.9 mg/dL (ref 0.2–1.2)
Total Protein: 7.2 g/dL (ref 6.0–8.3)

## 2020-05-23 LAB — TSH: TSH: 0.76 u[IU]/mL (ref 0.35–4.50)

## 2020-05-23 MED ORDER — BENAZEPRIL HCL 40 MG PO TABS: 40.0000 mg | ORAL_TABLET | Freq: Every day | ORAL | 3 refills | Status: DC

## 2020-05-23 MED ORDER — ZOLPIDEM TARTRATE 10 MG PO TABS
ORAL_TABLET | Freq: Every evening | ORAL | 5 refills | Status: DC | PRN
  Filled 2020-05-23: qty 30, 30d supply, fill #0
  Filled 2020-06-23: qty 30, 30d supply, fill #1
  Filled 2020-07-21: qty 30, 30d supply, fill #2
  Filled 2020-08-16 – 2020-08-18 (×2): qty 30, 30d supply, fill #3
  Filled 2020-09-19: qty 30, 30d supply, fill #4
  Filled 2020-10-19: qty 30, 30d supply, fill #5

## 2020-05-23 MED FILL — Amlodipine Besylate Tab 10 MG (Base Equivalent): ORAL | 90 days supply | Qty: 90 | Fill #0 | Status: AC

## 2020-05-23 NOTE — Patient Instructions (Signed)
Please return in 3 months to recheck blood pressure.   Please take the benazapril 40mg  daily (new RX printed for you) and amlodipine 10mg  daily.   I will release your lab results to you on your MyChart account with further instructions. Please reply with any questions.    If you have any questions or concerns, please don't hesitate to send me a message via MyChart or call the office at [phone]. Thank you for visiting with [place] today! It's our pleasure caring for you.

## 2020-05-23 NOTE — Telephone Encounter (Signed)
Last Refill 04/16/2020   Last OV 05/23/2020 dx Hypertension

## 2020-05-23 NOTE — Telephone Encounter (Signed)
Called and left a voicemail for patient to return

## 2020-05-23 NOTE — Telephone Encounter (Signed)
Chart does not show ambien being sent in from today's visit, let patient know Dr.Andy is out of office and will work on sending it in Monday.

## 2020-05-23 NOTE — Progress Notes (Signed)
Subjective  CC:  Chief Complaint  Patient presents with  . Hypertension    HPI: Alexis Banks is a 75 y.o. female who presents to the office today to address the problems listed above in the chief complaint.  Hypertension f/u: she is taking benazepril 20 but should be on 40 and amlodipine 10. Feels well. No AEs.  Control is fair . Pt reports she is doing well. taking medications as instructed, no medication side effects noted, no TIAs, no chest pain on exertion, no dyspnea on exertion, no swelling of ankles. She denies adverse effects from his BP medications. Compliance with medication is good.   Hypothyroidism: adjusted meds to variable dosing, m-f and then SS. Doing well with change. Feels well w/o signs or sxs of low or high thyroid   Lab Results  Component Value Date   TSH 0.76 05/23/2020    Primary insomnia on chronic ambien: continues to do well with meds. Understands risks vs benefits. No AEs  Assessment  1. Essential hypertension   2. Acquired hypothyroidism   3. Primary insomnia       Plan    Hypertension f/u: BP control is fairly well controlled. Will increase benazepril back up to 40 daily (believe it was an auto refill mistake) and continue amlodipine to get well controlled. Recheck cmp  Hypothyroidism: feels well. Check levels and adjust does if needed.   Insomnia on chornic ambien. Refilled today.   Education regarding management of these chronic disease states was given. Management strategies discussed on successive visits include dietary and exercise recommendations, goals of achieving and maintaining IBW, and lifestyle modifications aiming for adequate sleep and minimizing stressors.   Follow up: 6 mo for cpe  Orders Placed This Encounter  Procedures  . TSH  . Comprehensive metabolic panel   Meds ordered this encounter  Medications  . benazepril (LOTENSIN) 40 MG tablet    Sig: Take 1 tablet (40 mg total) by mouth daily.    Dispense:  90  tablet    Refill:  3    Increasing dose from 20mg  daily. thanks      BP Readings from Last 3 Encounters:  05/23/20 140/82  12/11/19 112/70  10/19/19 (!) 150/90   Wt Readings from Last 3 Encounters:  05/23/20 162 lb (73.5 kg)  12/11/19 163 lb (73.9 kg)  11/07/19 165 lb 9.6 oz (75.1 kg)    Lab Results  Component Value Date   CHOL 135 12/11/2019   CHOL 140 12/06/2018   CHOL 143 05/26/2017   Lab Results  Component Value Date   HDL 57 12/11/2019   HDL 52.80 12/06/2018   HDL 52.60 05/26/2017   Lab Results  Component Value Date   LDLCALC 58 12/11/2019   LDLCALC 52 12/06/2018   LDLCALC 70 05/26/2017   Lab Results  Component Value Date   TRIG 123 12/11/2019   TRIG 175.0 (H) 12/06/2018   TRIG 100.0 05/26/2017   Lab Results  Component Value Date   CHOLHDL 2.4 12/11/2019   CHOLHDL 3 12/06/2018   CHOLHDL 3 05/26/2017   Lab Results  Component Value Date   LDLDIRECT 112.6 11/07/2008   LDLDIRECT 136.6 01/09/2007   Lab Results  Component Value Date   CREATININE 0.89 05/23/2020   BUN 18 05/23/2020   NA 142 05/23/2020   K 4.6 05/23/2020   CL 106 05/23/2020   CO2 26 05/23/2020    The 10-year ASCVD risk score [name], et al., 2013) is: 26.7%  Values used to calculate the score:     Age: 64 years     Sex: Female     Is Non-Hispanic African American: No     Diabetic: No     Tobacco smoker: No     Systolic Blood Pressure: 950 mmHg     Is BP treated: Yes     HDL Cholesterol: 57 mg/dL     Total Cholesterol: 135 mg/dL  I reviewed the patients updated PMH, FH, and SocHx.    Patient Active Problem List   Diagnosis Date Noted  . Obesity (BMI 30-39.9) 11/10/2012    Priority: High  . Acquired hypothyroidism 01/09/2007    Priority: High  . Mixed hyperlipidemia 01/09/2007    Priority: High  . Essential hypertension 01/09/2007    Priority: High  . Irritable bowel syndrome with diarrhea 06/17/2015    Priority: Medium  . Osteopenia 09/10/2014    Priority:  Medium  . OAB (overactive bladder) 02/24/2012    Priority: Medium  . GERD 03/27/2009    Priority: Medium  . Primary insomnia 03/02/2007    Priority: Medium  . 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 Uses hearing aid 12/22/2015    Priority: Low    Allergies: Adhesive [tape]  Social History: Patient  reports that she has never smoked. She has never used smokeless tobacco. She reports that she does not drink alcohol and does not use drugs.  Current Meds  Medication Sig  . amLODipine (NORVASC) 10 MG tablet TAKE 1 TABLET BY MOUTH DAILY.  Marland Kitchen aspirin 81 MG tablet Take 81 mg by mouth daily.  . Calcium Carb-Cholecalciferol (CALCIUM-VITAMIN D3) 600-500 MG-UNIT CAPS Take 1 capsule by mouth daily.  Marland Kitchen levothyroxine (SYNTHROID) 112 MCG tablet Take 1 tablet (112 mcg) by mouth once daily on Saturday and Sunday  . levothyroxine (SYNTHROID) 125 MCG tablet Take 1 tablet (125 mcg) by mouth once daily on Monday -Friday  . lovastatin (MEVACOR) 40 MG tablet TAKE 1 TABLET BY MOUTH AT  BEDTIME  . Multiple Vitamin (MULTIVITAMIN) tablet Take 1 tablet by mouth daily.  . Zinc Sulfate (ZINC 15 PO) Take by mouth.  . [DISCONTINUED] benazepril (LOTENSIN) 20 MG tablet Take 20 mg by mouth daily.    Review of Systems: Cardiovascular: negative for chest pain, palpitations, leg swelling, orthopnea Respiratory: negative for SOB, wheezing or persistent cough Gastrointestinal: negative for abdominal pain Genitourinary: negative for dysuria or gross hematuria  Objective  Vitals: BP 140/82   Pulse 75   Temp 98.8 F (37.1 C) (Temporal)   Wt 162 lb (73.5 kg)   SpO2 98%   BMI 28.70 kg/m  General: no acute distress  Psych:  Alert and oriented, normal mood and affect HEENT:  Normocephalic, atraumatic, supple neck  Cardiovascular:  RRR without murmur. no edema Respiratory:  Good breath sounds bilaterally, CTAB with normal respiratory effort Skin:  Warm, no rashes Neurologic:   Mental status is  normal  Commons side effects, risks, benefits, and alternatives for medications and treatment plan prescribed today were discussed, and the patient expressed understanding of the given instructions. Patient is instructed to call or message via MyChart if he/she has any questions or concerns regarding our treatment plan. No barriers to understanding were identified. We discussed Red Flag symptoms and signs in detail. Patient expressed understanding regarding what to do in case of urgent or emergency type symptoms.   Medication list was reconciled, printed and provided to the patient in AVS. Patient instructions and  summary information was reviewed with the patient as documented in the AVS. This note was prepared with assistance of Dragon voice recognition software. Occasional wrong-word or sound-a-like substitutions may have occurred due to the inherent limitations of voice recognition software  This visit occurred during the SARS-CoV-2 public health emergency.  Safety protocols were in place, including screening questions prior to the visit, additional usage of staff PPE, and extensive cleaning of exam room while observing appropriate contact time as indicated for disinfecting solutions.

## 2020-05-23 NOTE — Telephone Encounter (Signed)
Patient states she had an appt today (05/23/20) with Dr. Jonni Sanger and that Dr. Jonni Sanger was to send a script for Ambien to [facility].    The pharmacy is telling the patient they do not have this script.

## 2020-05-25 ENCOUNTER — Encounter: Payer: Self-pay | Admitting: Family Medicine

## 2020-05-25 NOTE — Progress Notes (Signed)
Please call patient: I have reviewed his/her lab results. Her blood test results look good. Continue her medications as we discussed on Friday. No changes needed. Thanks.

## 2020-05-26 ENCOUNTER — Other Ambulatory Visit (HOSPITAL_COMMUNITY): Payer: Self-pay

## 2020-05-28 ENCOUNTER — Encounter: Payer: Self-pay | Admitting: Family Medicine

## 2020-06-09 ENCOUNTER — Ambulatory Visit: Payer: Medicare Other | Admitting: Family Medicine

## 2020-06-17 ENCOUNTER — Other Ambulatory Visit (HOSPITAL_COMMUNITY): Payer: Self-pay

## 2020-06-23 ENCOUNTER — Other Ambulatory Visit (HOSPITAL_COMMUNITY): Payer: Self-pay

## 2020-06-24 ENCOUNTER — Other Ambulatory Visit (HOSPITAL_COMMUNITY): Payer: Self-pay

## 2020-07-22 ENCOUNTER — Other Ambulatory Visit (HOSPITAL_COMMUNITY): Payer: Self-pay

## 2020-07-23 ENCOUNTER — Telehealth: Payer: Self-pay

## 2020-07-23 NOTE — Chronic Care Management (AMB) (Addendum)
Chronic Care Management Pharmacy Assistant   Name: Alexis Banks  MRN: [id number] DOB: 1945/03/16  Reason for Encounter: Hypertension Disease State Call  Recent office visits:  05/23/20- Alexis Chang, MD- seen for hypertension follow up, increased benazepril from 20 mg daily to 40 mg daily, follow up 6 months cpe  Recent consult visits:  No visits noted   Hospital visits:  None in previous 6 months  Medications: Outpatient Encounter Medications as of 07/23/2020  Medication Sig   amLODipine (NORVASC) 10 MG tablet TAKE 1 TABLET BY MOUTH DAILY.   aspirin 81 MG tablet Take 81 mg by mouth daily.   benazepril (LOTENSIN) 40 MG tablet Take 1 tablet (40 mg total) by mouth daily.   Calcium Carb-Cholecalciferol (CALCIUM-VITAMIN D3) 600-500 MG-UNIT CAPS Take 1 capsule by mouth daily.   levothyroxine (SYNTHROID) 112 MCG tablet Take 1 tablet (112 mcg) by mouth once daily on Saturday and Sunday   levothyroxine (SYNTHROID) 125 MCG tablet Take 1 tablet (125 mcg) by mouth once daily on Monday -Friday   lovastatin (MEVACOR) 40 MG tablet TAKE 1 TABLET BY MOUTH AT  BEDTIME   Multiple Vitamin (MULTIVITAMIN) tablet Take 1 tablet by mouth daily.   Zinc Sulfate (ZINC 15 PO) Take by mouth.   zolpidem (AMBIEN) 10 MG tablet TAKE 1 TABLET BY MOUTH AT BEDTIME AS NEEDED FOR SLEEP   [DISCONTINUED] colestipol (COLESTID) 1 G tablet Take 1 g by mouth daily.     [DISCONTINUED] hydrochlorothiazide (HYDRODIURIL) 25 MG tablet Take 1 tablet (25 mg total) by mouth daily.   No facility-administered encounter medications on file as of 07/23/2020.  Reviewed chart prior to disease state call. Spoke with patient regarding BP  Recent Office Vitals: BP Readings from Last 3 Encounters:  05/23/20 140/82  12/11/19 112/70  10/19/19 (!) 150/90   Pulse Readings from Last 3 Encounters:  05/23/20 75  12/11/19 84  10/19/19 70    Wt Readings from Last 3 Encounters:  05/23/20 162 lb (73.5 kg)  12/11/19 163 lb (73.9 kg)   11/07/19 165 lb 9.6 oz (75.1 kg)     Kidney Function Lab Results  Component Value Date/Time   CREATININE 0.89 05/23/2020 10:50 AM   CREATININE 1.03 (H) 12/11/2019 08:26 AM   CREATININE 0.86 03/21/2019 08:52 AM   GFR 63.04 05/23/2020 10:50 AM   GFRNONAA 53 (L) 12/11/2019 08:26 AM   GFRAA 62 12/11/2019 08:26 AM    BMP Latest Ref Rng & Units 05/23/2020 12/31/2019 12/11/2019  Glucose 70 - 99 mg/dL 105(H) - 105(H)  BUN 6 - 23 mg/dL 18 - 23  Creatinine 0.40 - 1.20 mg/dL 0.89 - 1.03(H)  BUN/Creat Ratio 6 - 22 (calc) - - 22  Sodium 135 - 145 mEq/L 142 - 140  Potassium 3.5 - 5.1 mEq/L 4.6 - 5.1  Chloride 96 - 112 mEq/L 106 - 104  CO2 19 - 32 mEq/L 26 - 26  Calcium 8.4 - 10.5 mg/dL 10.0 9.6 10.5(H)   I spoke with Alexis Banks this afternoon and she is doing well. Since her visit with PCP in May where her benazepril was increased she has not seen much difference in her BP, but still feels good. She has no complaints or concerns otherwise for her health.  Current antihypertensive regimen:  Amlodipine 10 mg once daily Benazepril  40 mg daily  How often are you checking your Blood Pressure?  Patient stated that she checks her BP regularly, but doesn't write down any values   Current home  BP readings: None given   What recent interventions/DTPs have been made by any provider to improve Blood Pressure control since last CPP Visit:  05/23/20- Alexis Chang, MD-  increased benazepril from 20 mg daily to 40 mg daily  Any recent hospitalizations or ED visits since last visit with CPP? No  What diet changes have been made to improve Blood Pressure Control?  Patient stated there hasn't been any changes made to her diet   What exercise is being done to improve your Blood Pressure Control?  Patent stated she mainly stays indoor due to the heat   Adherence Review: Is the patient currently on ACE/ARB medication? Yes Does the patient have >5 day gap between last estimated fill dates? No Amlodipine  10 mg- 90 DS last filled 05/23/20  Star Rating Drugs: Benazepril  40 mg- 90 DS last filled 07/03/20 Lovastatin 40 mg - 90 DS last filled 07/03/20  Alexis Banks CPA, CMA

## 2020-08-16 ENCOUNTER — Other Ambulatory Visit (HOSPITAL_COMMUNITY): Payer: Self-pay

## 2020-08-18 ENCOUNTER — Other Ambulatory Visit (HOSPITAL_COMMUNITY): Payer: Self-pay

## 2020-08-28 ENCOUNTER — Other Ambulatory Visit: Payer: Self-pay | Admitting: Family Medicine

## 2020-08-29 ENCOUNTER — Other Ambulatory Visit (HOSPITAL_COMMUNITY): Payer: Self-pay

## 2020-08-29 ENCOUNTER — Other Ambulatory Visit: Payer: Self-pay

## 2020-08-29 ENCOUNTER — Ambulatory Visit (INDEPENDENT_AMBULATORY_CARE_PROVIDER_SITE_OTHER): Payer: Medicare Other | Admitting: Family Medicine

## 2020-08-29 ENCOUNTER — Other Ambulatory Visit: Payer: Self-pay | Admitting: Family Medicine

## 2020-08-29 ENCOUNTER — Encounter: Payer: Self-pay | Admitting: Family Medicine

## 2020-08-29 VITALS — BP 136/87 | HR 75 | Temp 97.6°F | Ht 63.0 in | Wt 166.2 lb

## 2020-08-29 DIAGNOSIS — I10 Essential (primary) hypertension: Secondary | ICD-10-CM | POA: Diagnosis not present

## 2020-08-29 DIAGNOSIS — E039 Hypothyroidism, unspecified: Secondary | ICD-10-CM | POA: Diagnosis not present

## 2020-08-29 DIAGNOSIS — Z7185 Encounter for immunization safety counseling: Secondary | ICD-10-CM | POA: Diagnosis not present

## 2020-08-29 MED ORDER — AMLODIPINE BESYLATE 10 MG PO TABS
ORAL_TABLET | Freq: Every day | ORAL | 3 refills | Status: DC
  Filled 2020-08-29: qty 90, 90d supply, fill #0
  Filled 2020-12-08: qty 90, 90d supply, fill #1
  Filled 2021-03-09: qty 90, 90d supply, fill #2
  Filled 2021-05-31: qty 90, 90d supply, fill #3

## 2020-08-29 MED ORDER — SHINGRIX 50 MCG/0.5ML IM SUSR
0.5000 mL | Freq: Once | INTRAMUSCULAR | 0 refills | Status: AC
Start: 2020-08-29 — End: 2020-08-29

## 2020-08-29 NOTE — Patient Instructions (Signed)
Please return in 3-4 months for your annual complete physical; please come fasting. We will do your blood work at that time.   Please take the prescription for Shingrix to the pharmacy so they may administer the vaccinations. Your insurance will then cover the injections.   I'll be on the lookout for your mammogram report.   If you have any questions or concerns, please don't hesitate to send me a message via MyChart or call the office at [phone]. Thank you for visiting with [place] today! It's our pleasure caring for you.

## 2020-08-29 NOTE — Progress Notes (Signed)
Subjective  CC:  Chief Complaint  Patient presents with   Hypertension    Taking medication as prescribed  No issues since last visit     HPI: Alexis Banks is a 75 y.o. female who presents to the office today to address the problems listed above in the chief complaint. Hypertension f/u: Control is good . Pt reports she is doing well. taking medications as instructed, no medication side effects noted, no TIAs, no chest pain on exertion, no dyspnea on exertion, no swelling of ankles. Back on benazepril 40 and amlodipine 10 and feels well. She denies adverse effects from his BP medications. Compliance with medication is good. Reviewed renal function and lipids Low thyroid: remains on variable dosing: 161mg M-F, 1198m sat and sun. No sxs of low or high thyroid. Good compliance. Uses a pill box. Vaccine: eligible for shingrix. Had zostrix in 2013.   Assessment  1. Essential hypertension   2. Acquired hypothyroidism   3. Vaccine counseling      Plan   Hypertension f/u: BP control is well controlled. Continue amlodipine 10 and benazepril 40 daily. Recheck renal function in 3 months.  Hypothyroidism f/u: clinically euthyroid. Continue levothyroxine 12585m 112 sat,sun. Recheck levels in 3 months.  Counseling for shingrix vaccine done. RX given. Pt to get.  Education regarding management of these chronic disease states was given. Management strategies discussed on successive visits include dietary and exercise recommendations, goals of achieving and maintaining IBW, and lifestyle modifications aiming for adequate sleep and minimizing stressors.   Follow up: 3-4 mo for cpe  No orders of the defined types were placed in this encounter.  Meds ordered this encounter  Medications   [facility]    Sig: Inject 0.5 mLs into the muscle once for 1 dose. Please give 2nd dose 2-6 months after first dose    Dispense:  2 each    Refill:  0      BP  Readings from Last 3 Encounters:  08/29/20 136/87  05/23/20 140/82  12/11/19 112/70   Wt Readings from Last 3 Encounters:  08/29/20 166 lb 3.2 oz (75.4 kg)  05/23/20 162 lb (73.5 kg)  12/11/19 163 lb (73.9 kg)    Lab Results  Component Value Date   CHOL 135 12/11/2019   CHOL 140 12/06/2018   CHOL 143 05/26/2017   Lab Results  Component Value Date   HDL 57 12/11/2019   HDL 52.80 12/06/2018   HDL 52.60 05/26/2017   Lab Results  Component Value Date   LDLCALC 58 12/11/2019   LDLCALC 52 12/06/2018   LDLCALC 70 05/26/2017   Lab Results  Component Value Date   TRIG 123 12/11/2019   TRIG 175.0 (H) 12/06/2018   TRIG 100.0 05/26/2017   Lab Results  Component Value Date   CHOLHDL 2.4 12/11/2019   CHOLHDL 3 12/06/2018   CHOLHDL 3 05/26/2017   Lab Results  Component Value Date   LDLDIRECT 112.6 11/07/2008   LDLDIRECT 136.6 01/09/2007   Lab Results  Component Value Date   CREATININE 0.89 05/23/2020   BUN 18 05/23/2020   NA 142 05/23/2020   K 4.6 05/23/2020   CL 106 05/23/2020   CO2 26 05/23/2020    The 10-year ASCVD risk score (GoMikey Bussing Jr., et al., 2013) is: 25.4%   Values used to calculate the score:     Age: [age]     Sex: Female     Is Non-Hispanic African  American: No     Diabetic: No     Tobacco smoker: No     Systolic Blood Pressure: [id number] mmHg     Is BP treated: Yes     HDL Cholesterol: 57 mg/dL     Total Cholesterol: 135 mg/dL  I reviewed the patients updated PMH, FH, and SocHx.    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 She has never used smokeless tobacco. She reports that she does not drink alcohol and does not use drugs.  Current Meds  Medication Sig   amLODipine (NORVASC) 10 MG tablet TAKE 1 TABLET BY MOUTH DAILY.   aspirin 81 MG tablet Take 81 mg by mouth daily.   benazepril (LOTENSIN) 40 MG tablet Take 1 tablet (40 mg total) by mouth daily.   Calcium Carb-Cholecalciferol (CALCIUM-VITAMIN D3) 600-500 MG-UNIT CAPS Take 1 capsule by mouth daily.   levothyroxine (SYNTHROID) 125 MCG tablet Take 1 tablet (125 mcg) by mouth once daily on Monday -Friday   lovastatin (MEVACOR) 40 MG tablet TAKE 1 TABLET BY MOUTH AT  BEDTIME   Multiple Vitamin (MULTIVITAMIN) tablet Take 1 tablet by mouth daily.   Zinc Sulfate (ZINC 15 PO) Take by mouth.   zolpidem (AMBIEN) 10 MG tablet TAKE 1 TABLET BY MOUTH AT BEDTIME AS NEEDED FOR SLEEP   Zoster Vaccine Adjuvanted [facility]) injection Inject 0.5 mLs into the muscle once for 1 dose. Please give 2nd dose 2-6 months after first dose   [DISCONTINUED] levothyroxine (SYNTHROID) 112 MCG tablet Take 1 tablet (112 mcg) by mouth once daily on Saturday and Sunday    Review of Systems: Cardiovascular: negative for chest pain, palpitations, leg swelling, orthopnea Respiratory: negative for SOB, wheezing or persistent cough Gastrointestinal: negative for abdominal pain Genitourinary: negative for dysuria or gross hematuria  Objective  Vitals: BP 136/87   Pulse 75   Temp 97.6 F (36.4 C) (Temporal)   Ht '5\' 3"'$  (1.6 m)   Wt 166 lb 3.2 oz (75.4 kg)   SpO2 96%   BMI 29.44 kg/m  General: no acute distress  Psych:  Alert and oriented, normal mood and affect HEENT:  Normocephalic, atraumatic, supple neck   Cardiovascular:  RRR without murmur. no edema Respiratory:  Good breath sounds bilaterally, CTAB with normal respiratory effort Neurologic:   Mental status is normal Commons side effects, risks, benefits, and alternatives for medications and treatment plan prescribed today were discussed, and the patient expressed understanding of the given instructions. Patient is instructed to call or message via MyChart if he/she has any questions or concerns regarding our treatment plan. No barriers to understanding were identified. We discussed Red Flag symptoms and signs in detail. Patient expressed understanding regarding what to do in  case of urgent or emergency type symptoms.  Medication list was reconciled, printed and provided to the patient in AVS. Patient instructions and summary information was reviewed with the patient as documented in the AVS. This note was prepared with assistance of Dragon voice recognition software. Occasional wrong-word or sound-a-like substitutions may have occurred due to the inherent limitations of voice recognition software  This visit occurred during the SARS-CoV-2 public health emergency.  Safety protocols were in place, including screening questions prior to the visit, additional usage of staff PPE, and extensive cleaning of exam room while observing appropriate contact time as indicated for disinfecting solutions.

## 2020-09-02 ENCOUNTER — Other Ambulatory Visit (HOSPITAL_COMMUNITY): Payer: Self-pay

## 2020-09-11 ENCOUNTER — Other Ambulatory Visit: Payer: Self-pay

## 2020-09-11 ENCOUNTER — Telehealth: Payer: Self-pay

## 2020-09-11 MED ORDER — BENAZEPRIL HCL 40 MG PO TABS: 40.0000 mg | ORAL_TABLET | Freq: Every day | ORAL | 3 refills | Status: DC

## 2020-09-11 NOTE — Telephone Encounter (Signed)
Refill sent to pharmacy.   

## 2020-09-11 NOTE — Telephone Encounter (Signed)
LAST APPOINTMENT DATE:  08/29/20  NEXT APPOINTMENT DATE: 12/30/20  MEDICATION:benazepril (LOTENSIN) 40 MG tablet  PHARMACY:[facility]  ([place], [place]

## 2020-09-18 ENCOUNTER — Other Ambulatory Visit (HOSPITAL_COMMUNITY): Payer: Self-pay

## 2020-09-19 ENCOUNTER — Other Ambulatory Visit (HOSPITAL_COMMUNITY): Payer: Self-pay

## 2020-09-24 ENCOUNTER — Telehealth: Payer: Self-pay

## 2020-09-24 NOTE — Telephone Encounter (Signed)
Error

## 2020-09-26 ENCOUNTER — Ambulatory Visit (INDEPENDENT_AMBULATORY_CARE_PROVIDER_SITE_OTHER): Payer: Medicare Other | Admitting: Physician Assistant

## 2020-09-26 ENCOUNTER — Other Ambulatory Visit (HOSPITAL_COMMUNITY): Payer: Self-pay

## 2020-09-26 ENCOUNTER — Ambulatory Visit: Payer: Medicare Other | Admitting: Physician Assistant

## 2020-09-26 ENCOUNTER — Encounter: Payer: Self-pay | Admitting: Physician Assistant

## 2020-09-26 ENCOUNTER — Other Ambulatory Visit: Payer: Self-pay

## 2020-09-26 VITALS — BP 110/74 | HR 92 | Temp 98.0°F | Ht 63.0 in | Wt 162.5 lb

## 2020-09-26 DIAGNOSIS — R1032 Left lower quadrant pain: Secondary | ICD-10-CM | POA: Diagnosis not present

## 2020-09-26 MED ORDER — AMOXICILLIN-POT CLAVULANATE 875-125 MG PO TABS
1.0000 | ORAL_TABLET | Freq: Two times a day (BID) | ORAL | 0 refills | Status: DC
Start: 2020-09-26 — End: 2020-11-20
  Filled 2020-09-26: qty 20, 10d supply, fill #0

## 2020-09-26 NOTE — Patient Instructions (Addendum)
-  Start on Augmentin for probable acute diverticulitis  -Tylenol / heating pad prn pain -Liquid diet this weekend -Call on Monday with an update please -Low threshold for ED this weekend if worse or any sudden changes

## 2020-09-26 NOTE — Progress Notes (Signed)
Acute Office Visit  Subjective:    Patient ID: Alexis Banks, female    DOB: 08-10-1945, 75 y.o.   MRN: [id number]  Chief Complaint  Patient presents with   Abdominal Pain    Abdominal Pain  Patient is in today for lower abdominal pain. States she had [employer] and mashed potatoes 6 days ago. Diarrhea 20-30 times for the next 24 hours after that. Then started with lower abdominal pain and LLQ pain. It feels sore & aching, sometimes sharp pain & sometimes burning feeling. Never had pain like this before. Feels tired. Green beans and potatoes last night. States she hasn't eaten anything today. Food does not seem to upset it if she does eat a little at a time. No N/V. No fever or chills. Hx moderate diverticulosis noted on colonoscopy in 2015.   Past Medical History:  Diagnosis Date   Anemia    ANXIETY DEPRESSION    Common bile duct dilation    CORONARY ARTERY DISEASE    GERD    Hyperlipidemia    Hypertension    HYPOTHYROIDISM    INSOMNIA    ROTATOR CUFF SYNDROME, RIGHT    Trochanteric bursitis of left hip    VARICOSE VEINS, LOWER EXTREMITIES    VITAMIN D DEFICIENCY     Past Surgical History:  Procedure Laterality Date   CHOLECYSTECTOMY     Rotator cuff surgery      Family History  Problem Relation Age of Onset   COPD Father    Alcohol abuse Mother    Alzheimer's disease Sister    Heart disease Neg Hx     Social History   Socioeconomic History   Marital status: Married    Spouse name: Not on file   Number of children: 3   Years of education: Not on file   Highest education level: Not on file  Occupational History    Employer: RETIRED    Comment: Retired  Tobacco Use   Smoking status: Never   Smokeless tobacco: Never  Substance and Sexual Activity   Alcohol use: No   Drug use: No   Sexual activity: Never  Other Topics Concern   Not on file  Social History Narrative   Not on file   Social Determinants of Health   Financial Resource  Strain: Not on file  Food Insecurity: No Food Insecurity   Worried About Charity fundraiser in the Last Year: Never true   Ran Out of Food in the Last Year: Never true  Transportation Needs: Not on file  Physical Activity: Not on file  Stress: Not on file  Social Connections: Not on file  Intimate Partner Violence: Not on file    Outpatient Medications Prior to Visit  Medication Sig Dispense Refill   amLODipine (NORVASC) 10 MG tablet TAKE 1 TABLET BY MOUTH DAILY. 90 tablet 3   aspirin 81 MG tablet Take 81 mg by mouth daily.     benazepril (LOTENSIN) 40 MG tablet Take 1 tablet (40 mg total) by mouth daily. 90 tablet 3   Calcium Carb-Cholecalciferol (CALCIUM-VITAMIN D3) 600-500 MG-UNIT CAPS Take 1 capsule by mouth daily.     levothyroxine (SYNTHROID) 125 MCG tablet Take 1 tablet (125 mcg) by mouth once daily on Monday -Friday 60 tablet 0   lovastatin (MEVACOR) 40 MG tablet TAKE 1 TABLET BY MOUTH AT  BEDTIME 90 tablet 3   Multiple Vitamin (MULTIVITAMIN) tablet Take 1 tablet by mouth daily.     Zinc  Sulfate (ZINC 15 PO) Take by mouth.     zolpidem (AMBIEN) 10 MG tablet TAKE 1 TABLET BY MOUTH AT BEDTIME AS NEEDED FOR SLEEP 30 tablet 5   No facility-administered medications prior to visit.    Allergies  Allergen Reactions   Adhesive [Tape] Rash    Rubbery tape - blisters    Review of Systems  Gastrointestinal:  Positive for abdominal pain.  REFER TO HPI FOR PERTINENT POSITIVES AND NEGATIVES     Objective:    Physical Exam Vitals and nursing note reviewed.  Constitutional:      Appearance: Normal appearance. She is normal weight. She is not toxic-appearing.  HENT:     Head: Normocephalic and atraumatic.     Right Ear: Tympanic membrane, ear canal and external ear normal.     Left Ear: Tympanic membrane, ear canal and external ear normal.     Nose: Nose normal.     Mouth/Throat:     Mouth: Mucous membranes are moist.  Eyes:     Extraocular Movements: Extraocular movements  intact.     Conjunctiva/sclera: Conjunctivae normal.     Pupils: Pupils are equal, round, and reactive to light.  Cardiovascular:     Rate and Rhythm: Normal rate and regular rhythm.     Pulses: Normal pulses.     Heart sounds: Normal heart sounds.  Pulmonary:     Effort: Pulmonary effort is normal.     Breath sounds: Normal breath sounds.  Abdominal:     General: Abdomen is flat. Bowel sounds are normal.     Palpations: Abdomen is soft.     Tenderness: There is abdominal tenderness in the left lower quadrant. There is no right CVA tenderness, left CVA tenderness, guarding or rebound. Negative signs include Murphy's sign, Rovsing's sign, McBurney's sign, psoas sign and obturator sign.  Musculoskeletal:        General: Normal range of motion.     Cervical back: Normal range of motion and neck supple.  Skin:    General: Skin is warm and dry.  Neurological:     General: No focal deficit present.     Mental Status: She is alert and oriented to person, place, and time.  Psychiatric:        Mood and Affect: Mood normal.        Behavior: Behavior normal.        Thought Content: Thought content normal.        Judgment: Judgment normal.    BP 110/74   Pulse 92   Temp 98 F (36.7 C)   Ht '5\' 3"'$  (1.6 m)   Wt 162 lb 8 oz (73.7 kg)   SpO2 93%   BMI 28.79 kg/m  Wt Readings from Last 3 Encounters:  09/26/20 162 lb 8 oz (73.7 kg)  08/29/20 166 lb 3.2 oz (75.4 kg)  05/23/20 162 lb (73.5 kg)    Health Maintenance Due  Topic Date Due   Zoster Vaccines- Shingrix (1 of 2) Never done   INFLUENZA VACCINE  08/18/2020   MAMMOGRAM  08/23/2020    There are no preventive care reminders to display for this patient.   Lab Results  Component Value Date   TSH 0.76 05/23/2020   Lab Results  Component Value Date   WBC 8.6 12/11/2019   HGB 14.5 12/11/2019   HCT 44.1 12/11/2019   MCV 90.6 12/11/2019   PLT 319 12/11/2019   Lab Results  Component Value Date   NA 142 05/23/2020  K 4.6  05/23/2020   CO2 26 05/23/2020   GLUCOSE 105 (H) 05/23/2020   BUN 18 05/23/2020   CREATININE 0.89 05/23/2020   BILITOT 0.9 05/23/2020   ALKPHOS 93 05/23/2020   AST 20 05/23/2020   ALT 16 05/23/2020   PROT 7.2 05/23/2020   ALBUMIN 4.4 05/23/2020   CALCIUM 10.0 05/23/2020   ANIONGAP 6 01/21/2014   GFR 63.04 05/23/2020   Lab Results  Component Value Date   CHOL 135 12/11/2019   Lab Results  Component Value Date   HDL 57 12/11/2019   Lab Results  Component Value Date   LDLCALC 58 12/11/2019   Lab Results  Component Value Date   TRIG 123 12/11/2019   Lab Results  Component Value Date   CHOLHDL 2.4 12/11/2019   Lab Results  Component Value Date   HGBA1C 6.1 (H) 12/26/2011       Assessment & Plan:   Problem List Items Addressed This Visit   None Visit Diagnoses     Left lower quadrant abdominal pain    -  Primary        Meds ordered this encounter  Medications   amoxicillin-clavulanate (AUGMENTIN) 875-125 MG tablet    Sig: Take 1 tablet by mouth 2 (two) times daily.    Dispense:  20 tablet    Refill:  0    1. Left lower quadrant abdominal pain She is tender on exam today, but no red flags.  No documented cases of diverticulitis for the patient, however, on review of her colonoscopy from 2015 it is noted to have moderate diverticulosis.  I discussed the patient with Dr. Jerline Pain today as her PCP was out of office.  As she is clinically stable, we will start on Augmentin twice daily for outpatient treatment of presumed acute diverticulitis.  She is also informed to stick to a liquid diet this weekend.  She has a very low threshold to present to the emergency department in case of any worsening pain, or other symptoms develop such as fever, weakness, nausea or vomiting.  Patient is going to call the office back on Monday with an update for [place].  This note was prepared with assistance of Systems analyst. Occasional wrong-word or sound-a-like  substitutions may have occurred due to the inherent limitations of voice recognition software.   Harl Wiechmann M Lavance Beazer, PA-C

## 2020-09-29 ENCOUNTER — Telehealth: Payer: Self-pay

## 2020-09-29 NOTE — Telephone Encounter (Signed)
Spoke with patient directly. States she feels 100% better since starting on Augmentin. Reassured her to finish course of tx and call if anything changes.

## 2020-09-29 NOTE — Telephone Encounter (Signed)
Patient is requesting a call back only by Alexis Banks she states she is doing a lot better but would like a call back

## 2020-10-07 ENCOUNTER — Telehealth: Payer: Self-pay

## 2020-10-07 ENCOUNTER — Other Ambulatory Visit: Payer: Self-pay

## 2020-10-07 ENCOUNTER — Other Ambulatory Visit (HOSPITAL_COMMUNITY): Payer: Self-pay

## 2020-10-07 MED ORDER — FLUCONAZOLE 150 MG PO TABS
150.0000 mg | ORAL_TABLET | Freq: Once | ORAL | 0 refills | Status: AC
Start: 2020-10-07 — End: 2020-10-08
  Filled 2020-10-07: qty 2, 3d supply, fill #0

## 2020-10-07 NOTE — Telephone Encounter (Signed)
Patient states she was prescribed amoxicillin on 09/26/20 by [facility].    States she now has a really bad yeast infection.  Symptoms:  itching and burning.  States has tried [employer] but this has not helped.  Finished up amoxicillin on Sunday. 9/18.  Would like to know if a RX could be sent to [place]?

## 2020-10-07 NOTE — Telephone Encounter (Signed)
Rx sent in, patient notified via voicemail

## 2020-10-14 ENCOUNTER — Ambulatory Visit (INDEPENDENT_AMBULATORY_CARE_PROVIDER_SITE_OTHER): Payer: Medicare Other

## 2020-10-14 ENCOUNTER — Telehealth: Payer: Medicare Other

## 2020-10-14 DIAGNOSIS — Z23 Encounter for immunization: Secondary | ICD-10-CM | POA: Diagnosis not present

## 2020-10-20 ENCOUNTER — Other Ambulatory Visit (HOSPITAL_COMMUNITY): Payer: Self-pay

## 2020-10-21 ENCOUNTER — Other Ambulatory Visit: Payer: Self-pay | Admitting: Family Medicine

## 2020-10-21 ENCOUNTER — Other Ambulatory Visit (HOSPITAL_COMMUNITY): Payer: Self-pay

## 2020-10-21 DIAGNOSIS — Z1231 Encounter for screening mammogram for malignant neoplasm of breast: Secondary | ICD-10-CM

## 2020-10-27 ENCOUNTER — Ambulatory Visit
Admission: RE | Admit: 2020-10-27 | Discharge: 2020-10-27 | Disposition: A | Discharge status: Home / Self Care | Payer: Medicare Other | Source: Ambulatory Visit | Attending: Family Medicine | Admitting: Family Medicine

## 2020-10-27 ENCOUNTER — Other Ambulatory Visit: Payer: Self-pay

## 2020-10-27 DIAGNOSIS — Z1231 Encounter for screening mammogram for malignant neoplasm of breast: Secondary | ICD-10-CM | POA: Diagnosis not present

## 2020-11-19 ENCOUNTER — Ambulatory Visit: Payer: Medicare Other | Admitting: Family Medicine

## 2020-11-20 ENCOUNTER — Other Ambulatory Visit (HOSPITAL_COMMUNITY): Payer: Self-pay

## 2020-11-20 ENCOUNTER — Other Ambulatory Visit: Payer: Self-pay

## 2020-11-20 ENCOUNTER — Ambulatory Visit (INDEPENDENT_AMBULATORY_CARE_PROVIDER_SITE_OTHER): Payer: Medicare Other | Admitting: Family Medicine

## 2020-11-20 ENCOUNTER — Encounter: Payer: Self-pay | Admitting: Family Medicine

## 2020-11-20 VITALS — BP 140/88 | HR 81 | Temp 97.6°F | Ht 63.0 in | Wt 168.6 lb

## 2020-11-20 DIAGNOSIS — Z0000 Encounter for general adult medical examination without abnormal findings: Secondary | ICD-10-CM

## 2020-11-20 DIAGNOSIS — F4322 Adjustment disorder with anxiety: Secondary | ICD-10-CM | POA: Diagnosis not present

## 2020-11-20 DIAGNOSIS — I10 Essential (primary) hypertension: Secondary | ICD-10-CM | POA: Diagnosis not present

## 2020-11-20 DIAGNOSIS — E782 Mixed hyperlipidemia: Secondary | ICD-10-CM

## 2020-11-20 DIAGNOSIS — F5101 Primary insomnia: Secondary | ICD-10-CM

## 2020-11-20 DIAGNOSIS — E039 Hypothyroidism, unspecified: Secondary | ICD-10-CM | POA: Diagnosis not present

## 2020-11-20 LAB — CBC WITH DIFFERENTIAL/PLATELET
Basophils Absolute: 0 10*3/uL (ref 0.0–0.1)
Basophils Relative: 0.5 % (ref 0.0–3.0)
Eosinophils Absolute: 0.2 10*3/uL (ref 0.0–0.7)
Eosinophils Relative: 3.5 % (ref 0.0–5.0)
HCT: 41.6 % (ref 36.0–46.0)
Hemoglobin: 13.6 g/dL (ref 12.0–15.0)
Lymphocytes Relative: 41.6 % (ref 12.0–46.0)
Lymphs Abs: 2.6 10*3/uL (ref 0.7–4.0)
MCHC: 32.7 g/dL (ref 30.0–36.0)
MCV: 91.6 fl (ref 78.0–100.0)
Monocytes Absolute: 0.5 10*3/uL (ref 0.1–1.0)
Monocytes Relative: 7.2 % (ref 3.0–12.0)
Neutro Abs: 3 10*3/uL (ref 1.4–7.7)
Neutrophils Relative %: 47.2 % (ref 43.0–77.0)
Platelets: 245 10*3/uL (ref 150.0–400.0)
RBC: 4.54 Mil/uL (ref 3.87–5.11)
RDW: 13.8 % (ref 11.5–15.5)
WBC: 6.3 10*3/uL (ref 4.0–10.5)

## 2020-11-20 LAB — COMPREHENSIVE METABOLIC PANEL
ALT: 17 U/L (ref 0–35)
AST: 23 U/L (ref 0–37)
Albumin: 4.4 g/dL (ref 3.5–5.2)
Alkaline Phosphatase: 91 U/L (ref 39–117)
BUN: 17 mg/dL (ref 6–23)
CO2: 27 mEq/L (ref 19–32)
Calcium: 9.9 mg/dL (ref 8.4–10.5)
Chloride: 106 mEq/L (ref 96–112)
Creatinine, Ser: 0.88 mg/dL (ref 0.40–1.20)
GFR: 63.68 mL/min (ref >60.00)
Glucose, Bld: 105 mg/dL — ABNORMAL HIGH (ref 70–99)
Potassium: 4.5 mEq/L (ref 3.5–5.1)
Sodium: 142 mEq/L (ref 135–145)
Total Bilirubin: 1 mg/dL (ref 0.2–1.2)
Total Protein: 7.5 g/dL (ref 6.0–8.3)

## 2020-11-20 LAB — LIPID PANEL
Cholesterol: 131 mg/dL (ref 0–200)
HDL: 54.8 mg/dL (ref >39.00)
LDL Cholesterol: 54 mg/dL (ref 0–99)
NonHDL: 76.16
Total CHOL/HDL Ratio: 2
Triglycerides: 112 mg/dL (ref 0.0–149.0)
VLDL: 22.4 mg/dL (ref 0.0–40.0)

## 2020-11-20 LAB — TSH: TSH: 0.37 u[IU]/mL (ref 0.35–5.50)

## 2020-11-20 MED ORDER — LOVASTATIN 40 MG PO TABS
40.0000 mg | ORAL_TABLET | Freq: Every day | ORAL | 3 refills | Status: DC
  Filled 2020-11-20: qty 90, 90d supply, fill #0

## 2020-11-20 MED ORDER — ALPRAZOLAM 0.25 MG PO TABS
0.2500 mg | ORAL_TABLET | Freq: Every day | ORAL | 0 refills | Status: DC | PRN
  Filled 2020-11-20: qty 20, 20d supply, fill #0

## 2020-11-20 MED ORDER — ZOLPIDEM TARTRATE 10 MG PO TABS
ORAL_TABLET | Freq: Every evening | ORAL | 5 refills | Status: DC | PRN
  Filled 2020-11-20: qty 30, 30d supply, fill #0
  Filled 2020-12-16: qty 30, 30d supply, fill #1
  Filled 2021-01-15: qty 30, 30d supply, fill #2
  Filled 2021-02-16: qty 30, 30d supply, fill #3
  Filled 2021-03-13 – 2021-03-16 (×2): qty 30, 30d supply, fill #4
  Filled 2021-04-15: qty 30, 30d supply, fill #5

## 2020-11-20 NOTE — Patient Instructions (Signed)
Please return in 6 months for hypertension follow up.   I will release your lab results to you on your MyChart account with further instructions. Please reply with any questions.    I hope your husband does well.   If you have any questions or concerns, please don't hesitate to send me a message via MyChart or call the office at [phone]. Thank you for visiting with [place] today! It's our pleasure caring for you.

## 2020-11-20 NOTE — Progress Notes (Signed)
Subjective  Chief Complaint  Patient presents with   Hypertension   Hyperlipidemia   Hypothyroidism   Annual Exam    HPI: Alexis Banks is a 75 y.o. female who presents to [facility] [place] today for a Female Wellness Visit. She also has the concerns and/or needs as listed above in the chief complaint. These will be addressed in addition to the Health Maintenance Visit.   Wellness Visit: annual visit with health maintenance review and exam without Pap  HM: up to date; will get dates for shingrix vaccinations.  Health Maintenance  Topic Date Due   Zoster Vaccines- Shingrix (1 of 2) Never done   DEXA SCAN  08/23/2021   MAMMOGRAM  10/27/2021   TETANUS/TDAP  06/18/2025   Pneumonia Vaccine 15+ Years old  Completed   INFLUENZA VACCINE  Completed   Hepatitis C Screening  Completed   HPV VACCINES  Aged Out   COVID-19 Vaccine  Discontinued    Chronic disease f/u and/or acute problem visit: (deemed necessary to be done in addition to the wellness visit): Anxiety: Unfortunately, patient's husband has extensive skin cancer of the face.  S/p a large excisional procedure and now heading towards radiation therapy.  Patient reports he is a difficult personality and she is feeling quite anxious about this and would like some medicines to help her get through it.  No history of depression. Primary insomnia: Doing well on Ambien History of diverticulitis: Treated a few months ago.  Resolved Hypertension hyperlipidemia: Both been well controlled on medications.  Due for recheck.  Due for lab work.  She is fasting today.  No chest pain or shortness of breath.  No adverse effects from statins. Hypothyroidism on variable dosing of levothyroxine.  Due for recheck.  Clinically doing well.  Assessment  1. Annual physical exam   2. Acquired hypothyroidism   3. Essential hypertension   4. Mixed hyperlipidemia   5. Transient adjustment reaction with anxiety   6. Primary  insomnia      Plan  Female Wellness Visit: Age appropriate Health Maintenance and Prevention measures were discussed with patient. Included topics are cancer screening recommendations, ways to keep healthy (see AVS) including dietary and exercise recommendations, regular eye and dental care, use of seat belts, and avoidance of moderate alcohol use and tobacco use.  Screens are up-to-date BMI: discussed patient's BMI and encouraged positive lifestyle modifications to help get to or maintain a target BMI. HM needs and immunizations were addressed and ordered. See below for orders. See HM and immunization section for updates. Routine labs and screening tests ordered including cmp, cbc and lipids where appropriate. Discussed recommendations regarding Vit D and calcium supplementation (see AVS)  Chronic disease management visit and/or acute problem visit: Adjustment reaction with anxiety due to husband's new diagnosis of cancer.  Educated on treatment options.  We will try low-dose Xanax 0.25 mg as needed.  Risk versus benefits discussed. Recheck thyroid levels agreeable dosing levothyroxine.  See medication list Hypertension well-controlled at home.  Borderline elevated today due to stress.  Continue amlodipine and ACE inhibitor Recheck lipids not fasting and rosuvastatin Continue Ambien nightly for chronic insomnia.  Follow up: 6 months for hypertension recheck, sooner if needed for anxiety Orders Placed This Encounter  Procedures   CBC with Differential/Platelet   Comprehensive metabolic panel   Lipid panel   TSH   Meds ordered this encounter  Medications   zolpidem (AMBIEN) 10 MG tablet    Sig: TAKE  1 TABLET BY MOUTH AT BEDTIME AS NEEDED FOR SLEEP    Dispense:  30 tablet    Refill:  5   lovastatin (MEVACOR) 40 MG tablet    Sig: Take 1 tablet (40 mg total) by mouth at bedtime.    Dispense:  90 tablet    Refill:  3   ALPRAZolam (XANAX) 0.25 MG tablet    Sig: Take 1 tablet (0.25 mg  total) by mouth daily as needed for anxiety.    Dispense:  20 tablet    Refill:  0      Body mass index is 29.87 kg/m. Wt Readings from Last 3 Encounters:  11/20/20 168 lb 9.6 oz (76.5 kg)  09/26/20 162 lb 8 oz (73.7 kg)  08/29/20 166 lb 3.2 oz (75.4 kg)     Patient Active Problem List   Diagnosis Date Noted   Obesity (BMI 30-39.9) 11/10/2012    Priority: High   Acquired hypothyroidism 01/09/2007    Priority: High   Mixed hyperlipidemia 01/09/2007    Priority: High   Essential hypertension 01/09/2007    Priority: High   Irritable bowel syndrome with diarrhea 06/17/2015    Priority: Medium    Osteopenia 09/10/2014    Priority: Medium     dexa 08/2019: osteopenia, T = -2.3, stable, recheck 2 years.     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    Priority: Low   Uses hearing aid 12/22/2015    Priority: [place] Maintenance  Topic Date Due   Zoster Vaccines- Shingrix (1 of 2) Never done   DEXA SCAN  08/23/2021   MAMMOGRAM  10/27/2021   TETANUS/TDAP  06/18/2025   Pneumonia Vaccine 70+ Years old  Completed   INFLUENZA VACCINE  Completed   Hepatitis C Screening  Completed   HPV VACCINES  Aged Out   COVID-19 Vaccine  Discontinued   Immunization History  Administered Date(s) Administered   Fluad Quad(high Dose 65+) 10/19/2019, 10/14/2020   Influenza, High Dose Seasonal PF 10/07/2012, 10/15/2014, 10/15/2015, 10/26/2016, 10/13/2017   Influenza,inj,Quad PF,6+ Mos 10/25/2013   Influenza-Unspecified 10/14/2018   PFIZER(Purple Top)SARS-COV-2 Vaccination 02/07/2019, 02/28/2019   Pneumococcal Conjugate-13 11/19/2014   Pneumococcal Polysaccharide-23 08/09/2011, 10/28/2011   Tdap 07/30/2008, 06/19/2015   Zoster, Live 09/09/2011   We updated and reviewed the patient's past history in detail and it is documented  below. Allergies: Patient is allergic to adhesive [tape]. Past Medical History Patient  has a past medical history of Anemia, ANXIETY DEPRESSION, Common bile duct dilation, CORONARY ARTERY DISEASE, GERD, Hyperlipidemia, Hypertension, HYPOTHYROIDISM, INSOMNIA, ROTATOR CUFF SYNDROME, RIGHT, Trochanteric bursitis of left hip, VARICOSE VEINS, LOWER EXTREMITIES, and VITAMIN D DEFICIENCY. Past Surgical History Patient  has a past surgical history that includes Cholecystectomy and Rotator cuff surgery. Family History: Patient family history includes Alcohol abuse in her mother; Alzheimer's disease in her sister; COPD in her father. Social History:  Patient  reports that she has never smoked. She has never used smokeless tobacco. She reports that she does not drink alcohol and does not use drugs.  Review of Systems: Constitutional: negative for fever or malaise Ophthalmic: negative for photophobia, double vision or loss of vision Cardiovascular: negative for chest pain, dyspnea on exertion, or new LE swelling Respiratory: negative for SOB or persistent cough Gastrointestinal: negative for abdominal pain, change  in bowel habits or melena Genitourinary: negative for dysuria or gross hematuria, no abnormal uterine bleeding or disharge Musculoskeletal: negative for new gait disturbance or muscular weakness Integumentary: negative for new or persistent rashes, no breast lumps Neurological: negative for TIA or stroke symptoms Psychiatric: negative for SI or delusions Allergic/Immunologic: negative for hives  Patient Care Team    Relationship Specialty Notifications Start End  Leamon Arnt, MD PCP - General Family Medicine  12/23/16   Lafayette Dragon, MD (Inactive) Consulting Physician Gastroenterology  07/30/13   Lelon Perla, MD Consulting Physician Cardiology  07/30/13   Wallene Huh, DPM Consulting Physician Podiatry  05/26/17   Latanya Maudlin, MD Consulting Physician Orthopedic Surgery   05/26/17   Jola Schmidt, MD Consulting Physician Ophthalmology  10/13/18   [facility], [place] Physician Audiology  10/13/18   Madelin Rear, [facility] Pharmacist Pharmacist  05/01/20    Comment: Phone [phone]    Objective  Vitals: BP 140/88   Pulse 81   Temp 97.6 F (36.4 C) (Temporal)   Ht 5\' 3"  (1.6 m)   Wt 168 lb 9.6 oz (76.5 kg)   SpO2 97%   BMI 29.87 kg/m  General:  Well developed, well nourished, no acute distress  Psych:  Alert and orientedx3,normal mood and affect, tearful when talking about her husband HEENT:  Normocephalic, atraumatic, non-icteric sclera,  supple neck without adenopathy, mass or thyromegaly Cardiovascular:  Normal S1, S2, RRR without gallop, rub or murmur Respiratory:  Good breath sounds bilaterally, CTAB with normal respiratory effort Gastrointestinal: normal bowel sounds, soft, non-tender, no noted masses. No HSM MSK: no deformities, contusions. Joints are without erythema or swelling.  Skin:  Warm, no rashes or suspicious lesions noted Neurologic:    Mental status is normal. Gross motor and sensory exams are normal. Normal gait.  Mild tremor present   Commons side effects, risks, benefits, and alternatives for medications and treatment plan prescribed today were discussed, and the patient expressed understanding of the given instructions. Patient is instructed to call or message via MyChart if he/she has any questions or concerns regarding our treatment plan. No barriers to understanding were identified. We discussed Red Flag symptoms and signs in detail. Patient expressed understanding regarding what to do in case of urgent or emergency type symptoms.  Medication list was reconciled, printed and provided to the patient in AVS. Patient instructions and summary information was reviewed with the patient as documented in the AVS. This note was prepared with assistance of Dragon voice recognition software. Occasional wrong-word or sound-a-like  substitutions may have occurred due to the inherent limitations of voice recognition software  This visit occurred during the SARS-CoV-2 public health emergency.  Safety protocols were in place, including screening questions prior to the visit, additional usage of staff PPE, and extensive cleaning of exam room while observing appropriate contact time as indicated for disinfecting solutions.

## 2020-11-21 ENCOUNTER — Other Ambulatory Visit: Payer: Self-pay | Admitting: Family Medicine

## 2020-12-08 ENCOUNTER — Other Ambulatory Visit (HOSPITAL_COMMUNITY): Payer: Self-pay

## 2020-12-16 ENCOUNTER — Other Ambulatory Visit (HOSPITAL_COMMUNITY): Payer: Self-pay

## 2020-12-30 ENCOUNTER — Ambulatory Visit: Payer: Medicare Other | Admitting: Family Medicine

## 2021-01-07 ENCOUNTER — Other Ambulatory Visit: Payer: Self-pay | Admitting: Family Medicine

## 2021-01-15 ENCOUNTER — Other Ambulatory Visit (HOSPITAL_COMMUNITY): Payer: Self-pay

## 2021-01-16 ENCOUNTER — Other Ambulatory Visit: Payer: Self-pay | Admitting: Family Medicine

## 2021-01-16 DIAGNOSIS — E039 Hypothyroidism, unspecified: Secondary | ICD-10-CM

## 2021-02-16 ENCOUNTER — Other Ambulatory Visit (HOSPITAL_COMMUNITY): Payer: Self-pay

## 2021-03-09 ENCOUNTER — Other Ambulatory Visit (HOSPITAL_COMMUNITY): Payer: Self-pay

## 2021-03-12 ENCOUNTER — Other Ambulatory Visit (HOSPITAL_COMMUNITY): Payer: Self-pay

## 2021-03-13 ENCOUNTER — Other Ambulatory Visit (HOSPITAL_COMMUNITY): Payer: Self-pay

## 2021-03-16 ENCOUNTER — Other Ambulatory Visit (HOSPITAL_COMMUNITY): Payer: Self-pay

## 2021-04-16 ENCOUNTER — Other Ambulatory Visit (HOSPITAL_COMMUNITY): Payer: Self-pay

## 2021-05-05 ENCOUNTER — Encounter: Payer: Self-pay | Admitting: Family Medicine

## 2021-05-05 ENCOUNTER — Other Ambulatory Visit (HOSPITAL_COMMUNITY): Payer: Self-pay

## 2021-05-05 ENCOUNTER — Ambulatory Visit (INDEPENDENT_AMBULATORY_CARE_PROVIDER_SITE_OTHER): Payer: Medicare Other | Admitting: Family Medicine

## 2021-05-05 VITALS — BP 126/80 | HR 79 | Temp 98.1°F | Ht 63.0 in | Wt 170.8 lb

## 2021-05-05 DIAGNOSIS — M8580 Other specified disorders of bone density and structure, unspecified site: Secondary | ICD-10-CM

## 2021-05-05 DIAGNOSIS — F5101 Primary insomnia: Secondary | ICD-10-CM

## 2021-05-05 DIAGNOSIS — Z7185 Encounter for immunization safety counseling: Secondary | ICD-10-CM

## 2021-05-05 DIAGNOSIS — F4322 Adjustment disorder with anxiety: Secondary | ICD-10-CM | POA: Diagnosis not present

## 2021-05-05 DIAGNOSIS — Z780 Asymptomatic menopausal state: Secondary | ICD-10-CM | POA: Diagnosis not present

## 2021-05-05 DIAGNOSIS — I10 Essential (primary) hypertension: Secondary | ICD-10-CM | POA: Diagnosis not present

## 2021-05-05 MED ORDER — ZOLPIDEM TARTRATE 10 MG PO TABS
ORAL_TABLET | Freq: Every evening | ORAL | 5 refills | Status: DC | PRN
  Filled 2021-05-05: qty 30, fill #0
  Filled 2021-05-19: qty 30, 30d supply, fill #0
  Filled 2021-06-16: qty 30, 30d supply, fill #1
  Filled 2021-07-16: qty 30, 30d supply, fill #2
  Filled 2021-08-13: qty 30, 30d supply, fill #3
  Filled 2021-09-13: qty 30, 30d supply, fill #4
  Filled 2021-10-13: qty 30, 30d supply, fill #5
  Filled ????-??-??: fill #0

## 2021-05-05 MED ORDER — SHINGRIX 50 MCG/0.5ML IM SUSR: 0.5000 mL | Freq: Once | INTRAMUSCULAR | 0 refills | Status: AC

## 2021-05-05 NOTE — Progress Notes (Signed)
? ? ?Subjective  ?CC:  ?Chief Complaint  ?Patient presents with  ? Hypertension  ?  Pt here for a 37mo F/U for Bp  ? ? ?HPI: Alexis Banks a 76y.o. female who presents to the office today to address the problems listed above in the chief complaint. ?Hypertension f/u: Control is good . Pt reports she is doing well. taking medications as instructed, no medication side effects noted, no TIAs, no chest pain on exertion, no dyspnea on exertion, no swelling of ankles. She denies adverse effects from his BP medications. Compliance with medication is good.  ?Anxiety: now resolved. Fortunately her husband has done well with his cancer treatment overall. Still monitoring and has a small recurrence but both are coping well. Only used about 3 of the xanax last year. Reports feels back to herself now.  ?Osteopenia: due for repeat bone density in august. ?Insomnia remains well controlled on ambien nightly. Requests refill. ?Eligible for shingrix. Has not yet gotten from pharmacy ? ?Assessment  ?1. Essential hypertension   ?2. Transient adjustment reaction with anxiety   ?3. Asymptomatic menopausal state   ?4. Osteopenia, unspecified location   ?5. Primary insomnia   ?6. Vaccine counseling   ? ?  ?Plan  ? ?Hypertension f/u: BP control is well controlled. Continue amlodipine 10 daily and benazepril 40 daily.  ?anxiety f/u: resolved. ?Educated on need for dexa and mammo in November. Pt to schedule.  ?Insomnia managed with chronic ambien. Has failed other treatments. Stable w/o Aes. Understands risks vs benefits.  ?Vaccine education; recommend pt to get shingrix. RX printed.  ? ?Education regarding management of these chronic disease states was given. Management strategies discussed on successive visits include dietary and exercise recommendations, goals of achieving and maintaining IBW, and lifestyle modifications aiming for adequate sleep and minimizing stressors.  ? ?Follow up: Return in about 7 months (around  12/05/2021) for complete physical. ? ?Orders Placed This Encounter  ?Procedures  ? DG Bone Density  ? ?Meds ordered this encounter  ?Medications  ? Zoster Vaccine Adjuvanted ([facility] injection  ?  Sig: Inject 0.5 mLs into the muscle once for 1 dose. Please give 2nd dose 2-6 months after first dose  ?  Dispense:  2 each  ?  Refill:  0  ? zolpidem (AMBIEN) 10 MG tablet  ?  Sig: TAKE 1 TABLET BY MOUTH AT BEDTIME AS NEEDED FOR SLEEP  ?  Dispense:  30 tablet  ?  Refill:  5  ? ?  ? ?BP Readings from Last 3 Encounters:  ?05/05/21 126/80  ?11/20/20 140/88  ?09/26/20 110/74  ? ?Wt Readings from Last 3 Encounters:  ?05/05/21 170 lb 12.8 oz (77.5 kg)  ?11/20/20 168 lb 9.6 oz (76.5 kg)  ?09/26/20 162 lb 8 oz (73.7 kg)  ? ? ?Lab Results  ?Component Value Date  ? CHOL 131 11/20/2020  ? CHOL 135 12/11/2019  ? CHOL 140 12/06/2018  ? ?Lab Results  ?Component Value Date  ? HDL 54.80 11/20/2020  ? HDL 57 12/11/2019  ? HDL 52.80 12/06/2018  ? ?Lab Results  ?Component Value Date  ? L[place]54 11/20/2020  ? LC[place]58 12/11/2019  ? L[place]52 12/06/2018  ? ?Lab Results  ?Component Value Date  ? TRIG 112.0 11/20/2020  ? TRIG 123 12/11/2019  ? TRIG 175.0 (H) 12/06/2018  ? ?Lab Results  ?Component Value Date  ? CHOLHDL 2 11/20/2020  ? CHOLHDL 2.4 12/11/2019  ? CHOLHDL 3 12/06/2018  ? ?Lab Results  ?  Component Value Date  ? LDLDIRECT 112.6 11/07/2008  ? LDLDIRECT 136.6 01/09/2007  ? ?Lab Results  ?Component Value Date  ? CREATININE 0.88 11/20/2020  ? BUN 17 11/20/2020  ? NA 142 11/20/2020  ? K 4.5 11/20/2020  ? CL 106 11/20/2020  ? CO2 27 11/20/2020  ? ? ?The 10-year ASCVD risk score (Arnett DK, et al., 2019) is: 24.8% ?  Values used to calculate the score: ?    Age: 76 years ?    Sex: Female ?    Is Non-Hispanic African American: No ?    Diabetic: No ?    Tobacco smoker: No ?    Systolic Blood Pressure: 403 mmHg ?    Is BP treated: Yes ?    HDL Cholesterol: 54.8 mg/dL ?    Total Cholesterol: 131 mg/dL ? ?I reviewed the patients updated  PMH, FH, and SocHx.  ?  ?Patient Active Problem List  ? Diagnosis Date Noted  ? Obesity (BMI 30-39.9) 11/10/2012  ?  Priority: High  ? Acquired hypothyroidism 01/09/2007  ?  Priority: High  ? Mixed hyperlipidemia 01/09/2007  ?  Priority: High  ? Essential hypertension 01/09/2007  ?  Priority: High  ? Irritable bowel syndrome with diarrhea 06/17/2015  ?  Priority: Medium   ? Osteopenia 09/10/2014  ?  Priority: Medium   ? OAB (overactive bladder) 02/24/2012  ?  Priority: Medium   ? GERD 03/27/2009  ?  Priority: Medium   ? Primary insomnia 03/02/2007  ?  Priority: Medium   ? 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  Priority: Low  ? Uses hearing aid 12/22/2015  ?  Priority: Low  ? ? ?Allergies: Adhesive [tape] ? ?Social History: ?Patient  reports that she has never smoked. She has never used smokeless tobacco. She reports that she does not drink alcohol and does not use drugs. ? ?Current Meds  ?Medication Sig  ? amLODipine (NORVASC) 10 MG tablet TAKE 1 TABLET BY MOUTH DAILY.  ? aspirin 81 MG tablet Take 81 mg by mouth daily.  ? benazepril (LOTENSIN) 40 MG tablet Take 1 tablet (40 mg total) by mouth daily.  ? Calcium Carb-Cholecalciferol (CALCIUM-VITAMIN D3) 600-500 MG-UNIT CAPS Take 1 capsule by mouth daily.  ? levothyroxine (SYNTHROID) 112 MCG tablet TAKE 1 TABLET BY MOUTH  DAILY FOR 2 DAYS PER WEEK,  ON SATURDAY AND SUNDAY  ONLY, TAKE BEFORE BREAKFAST  ? levothyroxine (SYNTHROID) 125 MCG tablet TAKE 1 TABLET BY MOUTH  DAILY FOR 5 DAYS PER WEEK,  MONDAY TO FRIDAY, TAKE  BEFORE BREAKFAST  ? lovastatin (MEVACOR) 40 MG tablet Take 1 tablet (40 mg total) by mouth at bedtime.  ? Multiple Vitamin (MULTIVITAMIN) tablet Take 1 tablet by mouth daily.  ? pyridoxine (B-6) 100 MG tablet Take 100 mg by mouth daily.  ? Zinc Sulfate (ZINC 15 PO) Take by mouth.  ? Zoster Vaccine Adjuvanted [facility]) injection Inject 0.5 mLs into the muscle once for 1 dose. Please give 2nd dose 2-6 months after first dose  ? [DISCONTINUED] zolpidem  (AMBIEN) 10 MG tablet TAKE 1 TABLET BY MOUTH AT BEDTIME AS NEEDED FOR SLEEP  ? ? ?Review of Systems: ?Cardiovascular: negative for chest pain, palpitations, leg swelling, orthopnea ?Respiratory: negative for SOB, wheezing or persistent cough ?Gastrointestinal: negative for abdominal pain ?Genitourinary: negative for dysuria or gross hematuria ? ?Objective  ?Vitals: BP 126/80   Pulse 79   Temp 98.1 ?F (36.7 ?C)   Ht '5\' 3"'$  (1.6 m)   Wt  170 lb 12.8 oz (77.5 kg)   SpO2 96%   BMI 30.26 kg/m?  ?General: no acute distress  ?Psych:  Alert and oriented, normal mood and affect ?HEENT:  Normocephalic, atraumatic, supple neck  ?Cardiovascular:  RRR without murmur. no edema ?Respiratory:  Good breath sounds bilaterally, CTAB with normal respiratory effort ?Skin:  Warm, no rashes ?Neurologic:   Mental status is normal ?Commons side effects, risks, benefits, and alternatives for medications and treatment plan prescribed today were discussed, and the patient expressed understanding of the given instructions. Patient is instructed to call or message via MyChart if he/she has any questions or concerns regarding our treatment plan. No barriers to understanding were identified. We discussed Red Flag symptoms and signs in detail. Patient expressed understanding regarding what to do in case of urgent or emergency type symptoms.  ?Medication list was reconciled, printed and provided to the patient in AVS. Patient instructions and summary information was reviewed with the patient as documented in the AVS. ?This note was prepared with assistance of Systems analyst. Occasional wrong-word or sound-a-like substitutions may have occurred due to the inherent limitations of voice recognition software ? ?This visit occurred during the SARS-CoV-2 public health emergency.  Safety protocols were in place, including screening questions prior to the visit, additional usage of staff PPE, and extensive cleaning of exam room while  observing appropriate contact time as indicated for disinfecting solutions.  ?

## 2021-05-05 NOTE — Patient Instructions (Addendum)
Please return in November for your annual complete physical; please come fasting.  ? ?Please take the prescription for Shingrix to the pharmacy so they may administer the vaccinations. Your insurance will then cover the injections.  ? ?If you have any questions or concerns, please don't hesitate to send me a message via MyChart or call the office at [phone]. Thank you for visiting with [place] today! It's our pleasure caring for you.  ? ?I have ordered a mammogram and/or bone density for you as we discussed today: ?'[x]'$   Mammogram due in October 2023 ?'[x]'$   Bone Density ? ?Please call the office checked below to schedule your appointment: ? ?'[x]'$   [facility]     ? [address] [place], [place]       ? [phone]        ? ?'[]'$   [facility] ? [place]  ? [place], [place] ? [phone] ? ?

## 2021-05-07 ENCOUNTER — Other Ambulatory Visit (HOSPITAL_COMMUNITY): Payer: Self-pay

## 2021-05-08 ENCOUNTER — Other Ambulatory Visit (HOSPITAL_COMMUNITY): Payer: Self-pay

## 2021-05-08 MED ORDER — ZOSTER VAC RECOMB ADJUVANTED 50 MCG/0.5ML IM SUSR
INTRAMUSCULAR | 1 refills | Status: DC
  Filled 2021-05-08: qty 0.5, 1d supply, fill #1
  Filled 2021-05-08: qty 0.5, 1d supply, fill #0
  Filled 2021-07-08 – 2021-08-20 (×2): qty 0.5, 1d supply, fill #1

## 2021-05-15 ENCOUNTER — Other Ambulatory Visit (HOSPITAL_COMMUNITY): Payer: Self-pay

## 2021-05-19 ENCOUNTER — Other Ambulatory Visit (HOSPITAL_COMMUNITY): Payer: Self-pay

## 2021-05-20 ENCOUNTER — Ambulatory Visit: Payer: Medicare Other | Admitting: Family Medicine

## 2021-06-01 ENCOUNTER — Other Ambulatory Visit (HOSPITAL_COMMUNITY): Payer: Self-pay

## 2021-06-16 ENCOUNTER — Other Ambulatory Visit (HOSPITAL_COMMUNITY): Payer: Self-pay

## 2021-07-08 ENCOUNTER — Other Ambulatory Visit (HOSPITAL_COMMUNITY): Payer: Self-pay

## 2021-07-16 ENCOUNTER — Other Ambulatory Visit: Payer: Self-pay | Admitting: Family Medicine

## 2021-07-16 ENCOUNTER — Other Ambulatory Visit (HOSPITAL_COMMUNITY): Payer: Self-pay

## 2021-07-25 ENCOUNTER — Other Ambulatory Visit (HOSPITAL_COMMUNITY): Payer: Self-pay

## 2021-08-13 ENCOUNTER — Other Ambulatory Visit: Payer: Self-pay | Admitting: Family Medicine

## 2021-08-13 ENCOUNTER — Other Ambulatory Visit (HOSPITAL_COMMUNITY): Payer: Self-pay

## 2021-08-13 MED ORDER — AMLODIPINE BESYLATE 10 MG PO TABS
ORAL_TABLET | Freq: Every day | ORAL | 3 refills | Status: DC
  Filled 2021-08-13: qty 90, 90d supply, fill #0
  Filled 2022-01-16: qty 90, 90d supply, fill #1

## 2021-08-20 ENCOUNTER — Other Ambulatory Visit (HOSPITAL_COMMUNITY): Payer: Self-pay

## 2021-08-22 ENCOUNTER — Other Ambulatory Visit: Payer: Self-pay | Admitting: Family Medicine

## 2021-08-24 ENCOUNTER — Other Ambulatory Visit (HOSPITAL_COMMUNITY): Payer: Self-pay

## 2021-09-10 ENCOUNTER — Other Ambulatory Visit (HOSPITAL_COMMUNITY): Payer: Self-pay

## 2021-09-10 DIAGNOSIS — S0502XA Injury of conjunctiva and corneal abrasion without foreign body, left eye, initial encounter: Secondary | ICD-10-CM | POA: Diagnosis not present

## 2021-09-10 DIAGNOSIS — T1512XA Foreign body in conjunctival sac, left eye, initial encounter: Secondary | ICD-10-CM | POA: Diagnosis not present

## 2021-09-10 DIAGNOSIS — H11122 Conjunctival concretions, left eye: Secondary | ICD-10-CM | POA: Diagnosis not present

## 2021-09-10 MED ORDER — GATIFLOXACIN 0.5 % OP SOLN
OPHTHALMIC | 1 refills | Status: DC
  Filled 2021-09-10: qty 2.5, 17d supply, fill #0

## 2021-09-10 MED ORDER — VALACYCLOVIR HCL 1 G PO TABS
ORAL_TABLET | ORAL | 1 refills | Status: DC
  Filled 2021-09-10: qty 30, 10d supply, fill #0

## 2021-09-14 ENCOUNTER — Other Ambulatory Visit (HOSPITAL_COMMUNITY): Payer: Self-pay

## 2021-09-14 DIAGNOSIS — H16102 Unspecified superficial keratitis, left eye: Secondary | ICD-10-CM | POA: Diagnosis not present

## 2021-09-22 DIAGNOSIS — T1512XA Foreign body in conjunctival sac, left eye, initial encounter: Secondary | ICD-10-CM | POA: Diagnosis not present

## 2021-09-28 ENCOUNTER — Other Ambulatory Visit: Payer: Self-pay | Admitting: Family Medicine

## 2021-09-28 DIAGNOSIS — Z1231 Encounter for screening mammogram for malignant neoplasm of breast: Secondary | ICD-10-CM

## 2021-10-01 ENCOUNTER — Telehealth: Payer: Self-pay | Admitting: Family Medicine

## 2021-10-01 NOTE — Telephone Encounter (Signed)
Patient requests to be called at ph# [phone] regarding: Patient states she had her 2nd Shingles vaccine on 08/20/21 and is scheduled for a Flu shot on 10/06/21 and wants to make sure it is okay to have the above vaccines so close together.  Patient states if she is unable to answer the phone, please leave a detailed message on voice mail.

## 2021-10-01 NOTE — Telephone Encounter (Signed)
Message sent thru MyChart 

## 2021-10-06 ENCOUNTER — Ambulatory Visit (INDEPENDENT_AMBULATORY_CARE_PROVIDER_SITE_OTHER): Payer: Medicare Other

## 2021-10-06 DIAGNOSIS — Z23 Encounter for immunization: Secondary | ICD-10-CM

## 2021-10-06 NOTE — Progress Notes (Signed)
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 Injection given in left deltoid. Pt tolerated well.

## 2021-10-09 ENCOUNTER — Other Ambulatory Visit: Payer: Self-pay | Admitting: Family Medicine

## 2021-10-09 DIAGNOSIS — E039 Hypothyroidism, unspecified: Secondary | ICD-10-CM

## 2021-10-13 ENCOUNTER — Other Ambulatory Visit (HOSPITAL_COMMUNITY): Payer: Self-pay

## 2021-10-22 ENCOUNTER — Encounter: Payer: Medicare Other | Admitting: Family Medicine

## 2021-10-28 ENCOUNTER — Ambulatory Visit: Payer: Medicare Other

## 2021-10-28 ENCOUNTER — Ambulatory Visit
Admission: RE | Admit: 2021-10-28 | Discharge: 2021-10-28 | Disposition: A | Discharge status: Home / Self Care | Payer: Medicare Other | Source: Ambulatory Visit | Attending: Family Medicine | Admitting: Family Medicine

## 2021-10-28 DIAGNOSIS — Z1231 Encounter for screening mammogram for malignant neoplasm of breast: Secondary | ICD-10-CM | POA: Diagnosis not present

## 2021-11-03 ENCOUNTER — Encounter: Payer: Self-pay | Admitting: Family Medicine

## 2021-11-03 ENCOUNTER — Other Ambulatory Visit (HOSPITAL_COMMUNITY): Payer: Self-pay

## 2021-11-03 ENCOUNTER — Ambulatory Visit (INDEPENDENT_AMBULATORY_CARE_PROVIDER_SITE_OTHER): Payer: Medicare Other | Admitting: Family Medicine

## 2021-11-03 VITALS — BP 132/80 | HR 77 | Temp 98.4°F | Ht 63.0 in | Wt 171.2 lb

## 2021-11-03 DIAGNOSIS — E782 Mixed hyperlipidemia: Secondary | ICD-10-CM | POA: Diagnosis not present

## 2021-11-03 DIAGNOSIS — Z0000 Encounter for general adult medical examination without abnormal findings: Secondary | ICD-10-CM | POA: Diagnosis not present

## 2021-11-03 DIAGNOSIS — I10 Essential (primary) hypertension: Secondary | ICD-10-CM

## 2021-11-03 DIAGNOSIS — Z780 Asymptomatic menopausal state: Secondary | ICD-10-CM | POA: Diagnosis not present

## 2021-11-03 DIAGNOSIS — M8580 Other specified disorders of bone density and structure, unspecified site: Secondary | ICD-10-CM

## 2021-11-03 DIAGNOSIS — F5101 Primary insomnia: Secondary | ICD-10-CM | POA: Diagnosis not present

## 2021-11-03 DIAGNOSIS — E039 Hypothyroidism, unspecified: Secondary | ICD-10-CM | POA: Diagnosis not present

## 2021-11-03 LAB — CBC WITH DIFFERENTIAL/PLATELET
Basophils Absolute: 0 10*3/uL (ref 0.0–0.1)
Basophils Relative: 0.6 % (ref 0.0–3.0)
Eosinophils Absolute: 0.2 10*3/uL (ref 0.0–0.7)
Eosinophils Relative: 2.9 % (ref 0.0–5.0)
HCT: 42.1 % (ref 36.0–46.0)
Hemoglobin: 13.7 g/dL (ref 12.0–15.0)
Lymphocytes Relative: 37.1 % (ref 12.0–46.0)
Lymphs Abs: 2.3 10*3/uL (ref 0.7–4.0)
MCHC: 32.6 g/dL (ref 30.0–36.0)
MCV: 92.7 fl (ref 78.0–100.0)
Monocytes Absolute: 0.5 10*3/uL (ref 0.1–1.0)
Monocytes Relative: 7.9 % (ref 3.0–12.0)
Neutro Abs: 3.2 10*3/uL (ref 1.4–7.7)
Neutrophils Relative %: 51.5 % (ref 43.0–77.0)
Platelets: 274 10*3/uL (ref 150.0–400.0)
RBC: 4.54 Mil/uL (ref 3.87–5.11)
RDW: 14.9 % (ref 11.5–15.5)
WBC: 6.3 10*3/uL (ref 4.0–10.5)

## 2021-11-03 LAB — COMPREHENSIVE METABOLIC PANEL
ALT: 19 U/L (ref 0–35)
AST: 23 U/L (ref 0–37)
Albumin: 4.3 g/dL (ref 3.5–5.2)
Alkaline Phosphatase: 79 U/L (ref 39–117)
BUN: 16 mg/dL (ref 6–23)
CO2: 26 mEq/L (ref 19–32)
Calcium: 9.9 mg/dL (ref 8.4–10.5)
Chloride: 105 mEq/L (ref 96–112)
Creatinine, Ser: 0.92 mg/dL (ref 0.40–1.20)
GFR: 59.97 mL/min — ABNORMAL LOW (ref >60.00)
Glucose, Bld: 103 mg/dL — ABNORMAL HIGH (ref 70–99)
Potassium: 3.8 mEq/L (ref 3.5–5.1)
Sodium: 139 mEq/L (ref 135–145)
Total Bilirubin: 0.7 mg/dL (ref 0.2–1.2)
Total Protein: 7.6 g/dL (ref 6.0–8.3)

## 2021-11-03 LAB — LIPID PANEL
Cholesterol: 135 mg/dL (ref 0–200)
HDL: 50.3 mg/dL (ref >39.00)
LDL Cholesterol: 53 mg/dL (ref 0–99)
NonHDL: 84.6
Total CHOL/HDL Ratio: 3
Triglycerides: 158 mg/dL — ABNORMAL HIGH (ref 0.0–149.0)
VLDL: 31.6 mg/dL (ref 0.0–40.0)

## 2021-11-03 LAB — TSH: TSH: 0.26 u[IU]/mL — ABNORMAL LOW (ref 0.35–5.50)

## 2021-11-03 MED ORDER — HYDROCHLOROTHIAZIDE 12.5 MG PO CAPS
12.5000 mg | ORAL_CAPSULE | Freq: Every day | ORAL | 3 refills | Status: DC
  Filled 2021-11-03: qty 90, 90d supply, fill #0
  Filled 2022-01-16: qty 90, 90d supply, fill #1
  Filled 2022-04-30: qty 90, 90d supply, fill #2
  Filled 2022-08-11: qty 90, 90d supply, fill #3

## 2021-11-03 MED ORDER — AMLODIPINE BESYLATE 10 MG PO TABS: 5.0000 mg | ORAL_TABLET | Freq: Every day | ORAL | 3 refills | Status: DC

## 2021-11-03 MED ORDER — ZOLPIDEM TARTRATE 10 MG PO TABS
10.0000 mg | ORAL_TABLET | Freq: Every day | ORAL | 5 refills | Status: DC
  Filled 2021-11-03 – 2021-11-12 (×3): qty 30, 30d supply, fill #0
  Filled 2021-12-11: qty 30, 30d supply, fill #1
  Filled 2022-01-12: qty 30, 30d supply, fill #2
  Filled 2022-02-09: qty 30, 30d supply, fill #3
  Filled 2022-03-12: qty 30, 30d supply, fill #4
  Filled 2022-04-10: qty 30, 30d supply, fill #5

## 2021-11-03 NOTE — Progress Notes (Signed)
Subjective  Chief Complaint  Patient presents with   Annual Exam    Pt is currently fasting     HPI: Alexis Banks is a 76 y.o. female who presents to [facility] [employer] today for a Female Wellness Visit. She also has the concerns and/or needs as listed above in the chief complaint. These will be addressed in addition to the Health Maintenance Visit.   Wellness Visit: annual visit with health maintenance review and exam without Pap  HM: mammo normal. Due dexa. All imms up to date. Feels well!  Chronic disease f/u and/or acute problem visit: (deemed necessary to be done in addition to the wellness visit): Low thyroid: tolerating the medications and taking the correct dosages "religiously". No sxs of high or low thyroid.  HTN: home readings normal. On amlodipine with mild ankle swelling, '10mg'$  daily. Benazapril 40 daily. Would like to retry the diuretic. Had some symptomatic lows when she was on the 25 daily in the past. No cp or sob. Walks for exercise. Gets mildly winded when walking uphill Insomnia remains well controlled on ambien nightly.  HLD due for recheck on statin. Tolerates well. Has been well controlled. No aes Osteopenia: due for recheck. Takes ca and vit D and walks.   Assessment  1. Annual physical exam   2. Acquired hypothyroidism   3. Mixed hyperlipidemia   4. Essential hypertension   5. Primary insomnia   6. Asymptomatic menopausal state   7. Osteopenia, unspecified location      Plan  Female Wellness Visit: Age appropriate Health Maintenance and Prevention measures were discussed with patient. Included topics are cancer screening recommendations, ways to keep healthy (see AVS) including dietary and exercise recommendations, regular eye and dental care, use of seat belts, and avoidance of moderate alcohol use and tobacco use. Screens are current BMI: discussed patient's BMI and encouraged positive lifestyle modifications to help get to or  maintain a target BMI. HM needs and immunizations were addressed and ordered. See below for orders. See HM and immunization section for updates. Routine labs and screening tests ordered including cmp, cbc and lipids where appropriate. Discussed recommendations regarding Vit D and calcium supplementation (see AVS)  Chronic disease management visit and/or acute problem visit: Low thyroid for recheck on 114mg weekends and 125 m-f. Clinically euthyroid HLD on lovastatin 40 nightly. Lipids and lfts today HTN is controlled. Will halve amlodipine to '5mg'$  and continue benazapril 40 and add microzide 12.5. monitor home bp and ankle swelling. Check renal function and electrolytes. Recheck dexa to monitor osteopenia.   Follow up: 6 mo to recheck bp  Orders Placed This Encounter  Procedures   DG Bone Density   CBC with Differential/Platelet   Comprehensive metabolic panel   Lipid panel   TSH   Meds ordered this encounter  Medications   amLODipine (NORVASC) 10 MG tablet    Sig: Take 0.5 tablets (5 mg total) by mouth daily.    Dispense:  90 tablet    Refill:  3   hydrochlorothiazide (MICROZIDE) 12.5 MG capsule    Sig: Take 1 capsule (12.5 mg total) by mouth daily.    Dispense:  90 capsule    Refill:  3   zolpidem (AMBIEN) 10 MG tablet    Sig: Take 1 tablet (10 mg total) by mouth at bedtime.    Dispense:  30 tablet    Refill:  5      Body mass index is 30.33 kg/m. Wt Readings  from Last 3 Encounters:  11/03/21 171 lb 3.2 oz (77.7 kg)  05/05/21 170 lb 12.8 oz (77.5 kg)  11/20/20 168 lb 9.6 oz (76.5 kg)     Patient Active Problem List   Diagnosis Date Noted   Obesity (BMI 30-39.9) 11/10/2012    Priority: High   Acquired hypothyroidism 01/09/2007    Priority: High   Mixed hyperlipidemia 01/09/2007    Priority: High   Essential hypertension 01/09/2007    Priority: High   Irritable bowel syndrome with diarrhea 06/17/2015    Priority: Medium    Osteopenia 09/10/2014    Priority:  Medium     dexa 08/2019: osteopenia, T = -2.3, stable, recheck 2 years.     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    Priority: Low   Uses hearing aid 12/22/2015    Priority: [place] Maintenance  Topic Date Due   DEXA SCAN  08/23/2021   MAMMOGRAM  10/29/2022   TETANUS/TDAP  06/18/2025   Pneumonia Vaccine 62+ Years old  Completed   INFLUENZA VACCINE  Completed   Hepatitis C Screening  Completed   Zoster Vaccines- Shingrix  Completed   HPV VACCINES  Aged Out   COLONOSCOPY (Pts 45-71yr Insurance coverage will need to be confirmed)  Discontinued   COVID-19 Vaccine  Discontinued   Immunization History  Administered Date(s) Administered   Fluad Quad(high Dose 65+) 10/15/2015, 10/26/2016, 10/19/2019, 10/14/2020, 10/06/2021   Influenza, High Dose Seasonal PF 10/07/2012, 10/15/2014, 10/15/2015, 10/26/2016, 10/13/2017   Influenza,inj,Quad PF,6+ Mos 10/25/2013   Influenza-Unspecified 10/14/2018   PFIZER(Purple Top)SARS-COV-2 Vaccination 02/07/2019, 02/28/2019   Pneumococcal Conjugate-13 11/19/2014   Pneumococcal Polysaccharide-23 08/09/2011, 10/28/2011   Tdap 07/30/2008, 06/19/2015   Zoster Recombinat (Shingrix) 05/08/2021, 08/20/2021   Zoster, Live 09/09/2011   We updated and reviewed the patient's past history in detail and it is documented below. Allergies: Patient is allergic to adhesive [tape]. Past Medical History Patient  has a past medical history of Anemia, ANXIETY DEPRESSION, Common bile duct dilation, CORONARY ARTERY DISEASE, GERD, Hyperlipidemia, Hypertension, HYPOTHYROIDISM, INSOMNIA, ROTATOR CUFF SYNDROME, RIGHT, Trochanteric bursitis of left hip, VARICOSE VEINS, LOWER EXTREMITIES, and VITAMIN D DEFICIENCY. Past Surgical History Patient  has a past surgical history that includes Cholecystectomy and Rotator cuff  surgery. Family History: Patient family history includes Alcohol abuse in her mother; Alzheimer's disease in her sister; COPD in her father. Social History:  Patient  reports that she has never smoked. She has never used smokeless tobacco. She reports that she does not drink alcohol and does not use drugs.  Review of Systems: Constitutional: negative for fever or malaise Ophthalmic: negative for photophobia, double vision or loss of vision Cardiovascular: negative for chest pain, dyspnea on exertion, or new LE swelling Respiratory: negative for SOB or persistent cough Gastrointestinal: negative for abdominal pain, change in bowel habits or melena Genitourinary: negative for dysuria or gross hematuria, no abnormal uterine bleeding or disharge Musculoskeletal: negative for new gait disturbance or muscular weakness Integumentary: negative for new or persistent rashes, no breast lumps Neurological: negative for TIA or stroke symptoms Psychiatric: negative for SI or delusions Allergic/Immunologic: negative for hives  Patient Care Team    Relationship Specialty Notifications Start End  ALeamon Arnt MD PCP - General Family Medicine  12/23/16   BLafayette Dragon MD (Inactive) Consulting  Physician Gastroenterology  07/30/13   Lelon Perla, MD Consulting Physician Cardiology  07/30/13   Wallene Huh, DPM Consulting Physician Podiatry  05/26/17   Latanya Maudlin, MD Consulting Physician Orthopedic Surgery  05/26/17   Jola Schmidt, MD Consulting Physician Ophthalmology  10/13/18   [facility] Consulting Physician Audiology  10/13/18   Madelin Rear, [facility] (Inactive) Pharmacist Pharmacist  05/01/20    Comment: Phone [phone]    Objective  Vitals: BP 132/80   Pulse 77   Temp 98.4 F (36.9 C)   Ht '5\' 3"'$  (1.6 m)   Wt 171 lb 3.2 oz (77.7 kg)   SpO2 96%   BMI 30.33 kg/m  General:  Well developed, well nourished, no acute distress  Psych:  Alert and orientedx3,normal mood  and affect HEENT:  Normocephalic, atraumatic, non-icteric sclera,  supple neck without adenopathy, mass or thyromegaly Cardiovascular:  Normal S1, S2, RRR without gallop, rub or murmur Respiratory:  Good breath sounds bilaterally, CTAB with normal respiratory effort Gastrointestinal: normal bowel sounds, soft, non-tender, no noted masses. No HSM MSK: no deformities, contusions. Joints are without erythema or swelling.  Tr ankle non pitting edema Skin:  Warm, no rashes or suspicious lesions noted Neurologic:    Mental status is normal.  Gross motor and sensory exams are normal. Normal gait. No tremor   Commons side effects, risks, benefits, and alternatives for medications and treatment plan prescribed today were discussed, and the patient expressed understanding of the given instructions. Patient is instructed to call or message via MyChart if he/she has any questions or concerns regarding our treatment plan. No barriers to understanding were identified. We discussed Red Flag symptoms and signs in detail. Patient expressed understanding regarding what to do in case of urgent or emergency type symptoms.  Medication list was reconciled, printed and provided to the patient in AVS. Patient instructions and summary information was reviewed with the patient as documented in the AVS. This note was prepared with assistance of Dragon voice recognition software. Occasional wrong-word or sound-a-like substitutions may have occurred due to the inherent limitations of voice recognition software

## 2021-11-03 NOTE — Patient Instructions (Signed)
Please return in 6 months for hypertension follow up.   Please cut the amlodipine to '5mg'$  (1/2 pill) daily and add the low dose diuretic (hydrochlorothiazide 12.'5mg'$ ) daily.   I will release your lab results to you on your MyChart account with further instructions. You may see the results before I do, but when I review them I will send you a message with my report or have my assistant call you if things need to be discussed. Please reply to my message with any questions. Thank you!   If you have any questions or concerns, please don't hesitate to send me a message via MyChart or call the office at [phone]. Thank you for visiting with [place] today! It's our pleasure caring for you.

## 2021-11-04 ENCOUNTER — Other Ambulatory Visit (HOSPITAL_COMMUNITY): Payer: Self-pay

## 2021-11-12 ENCOUNTER — Other Ambulatory Visit (HOSPITAL_COMMUNITY): Payer: Self-pay

## 2021-11-12 ENCOUNTER — Other Ambulatory Visit: Payer: Self-pay | Admitting: Family Medicine

## 2021-11-12 DIAGNOSIS — E039 Hypothyroidism, unspecified: Secondary | ICD-10-CM

## 2021-11-12 MED ORDER — LEVOTHYROXINE SODIUM 125 MCG PO TABS: ORAL_TABLET | ORAL | 3 refills | Status: DC

## 2021-11-12 MED ORDER — LEVOTHYROXINE SODIUM 112 MCG PO TABS: ORAL_TABLET | ORAL | 3 refills | Status: DC

## 2021-11-12 NOTE — Addendum Note (Signed)
Addended by: Billey Chang on: 11/12/2021 02:20 PM   Modules accepted: Orders

## 2021-11-17 NOTE — Progress Notes (Signed)
Please call patient: I have reviewed his/her lab results. Please notify patient of  the mychart note that was sent:  Dear Ms. Sao, Your lab results look mostly good; however, your thyroid is now a little off indicating you are getting too much medication.  Please change as follows:  Take the 176mg Fri, Sat and Sun and take the 1253m Mon through Thursday. We need to recheck again in 8 weeks. Please schedule a lab visit.  Sincerely, Dr. AnJonni Sanger

## 2021-11-26 ENCOUNTER — Encounter: Payer: Medicare Other | Admitting: Family Medicine

## 2021-12-11 ENCOUNTER — Other Ambulatory Visit (HOSPITAL_COMMUNITY): Payer: Self-pay

## 2021-12-14 ENCOUNTER — Other Ambulatory Visit (HOSPITAL_COMMUNITY): Payer: Self-pay

## 2022-01-06 ENCOUNTER — Other Ambulatory Visit: Payer: Medicare Other

## 2022-01-06 ENCOUNTER — Encounter: Payer: Self-pay | Admitting: Family Medicine

## 2022-01-06 ENCOUNTER — Other Ambulatory Visit (HOSPITAL_COMMUNITY): Payer: Self-pay

## 2022-01-06 ENCOUNTER — Ambulatory Visit (INDEPENDENT_AMBULATORY_CARE_PROVIDER_SITE_OTHER): Payer: Medicare Other | Admitting: Family Medicine

## 2022-01-06 VITALS — BP 132/80 | HR 106 | Temp 98.2°F | Ht 63.0 in | Wt 169.6 lb

## 2022-01-06 DIAGNOSIS — F4322 Adjustment disorder with anxiety: Secondary | ICD-10-CM | POA: Diagnosis not present

## 2022-01-06 DIAGNOSIS — I10 Essential (primary) hypertension: Secondary | ICD-10-CM

## 2022-01-06 DIAGNOSIS — E039 Hypothyroidism, unspecified: Secondary | ICD-10-CM | POA: Diagnosis not present

## 2022-01-06 MED ORDER — BUSPIRONE HCL 7.5 MG PO TABS
7.5000 mg | ORAL_TABLET | Freq: Three times a day (TID) | ORAL | 2 refills | Status: DC | PRN
  Filled 2022-01-06: qty 60, 20d supply, fill #0
  Filled 2022-02-09: qty 60, 20d supply, fill #1
  Filled 2022-04-10: qty 60, 20d supply, fill #2

## 2022-01-06 NOTE — Patient Instructions (Signed)
Please return in 3 months for recheck .  I will release your lab results to you on your MyChart account with further instructions. You may see the results before I do, but when I review them I will send you a message with my report or have my assistant call you if things need to be discussed. Please reply to my message with any questions. Thank you!   If you have any questions or concerns, please don't hesitate to send me a message via MyChart or call the office at [phone]. Thank you for visiting with [place] today! It's our pleasure caring for you.

## 2022-01-07 ENCOUNTER — Other Ambulatory Visit (HOSPITAL_COMMUNITY): Payer: Self-pay

## 2022-01-07 ENCOUNTER — Other Ambulatory Visit: Payer: Self-pay

## 2022-01-07 DIAGNOSIS — E039 Hypothyroidism, unspecified: Secondary | ICD-10-CM

## 2022-01-07 LAB — TSH: TSH: 0.21 u[IU]/mL — ABNORMAL LOW (ref 0.35–5.50)

## 2022-01-07 MED ORDER — LEVOTHYROXINE SODIUM 125 MCG PO TABS
125.0000 ug | ORAL_TABLET | Freq: Every day | ORAL | 3 refills | Status: DC
  Filled 2022-01-07: qty 36, 84d supply, fill #0
  Filled 2022-04-10 – 2022-08-11 (×2): qty 36, 84d supply, fill #1
  Filled 2022-12-09 (×2): qty 36, 84d supply, fill #2

## 2022-01-07 MED ORDER — LEVOTHYROXINE SODIUM 112 MCG PO TABS
112.0000 ug | ORAL_TABLET | Freq: Every day | ORAL | 3 refills | Status: DC
  Filled 2022-01-07: qty 48, 84d supply, fill #0
  Filled 2022-04-10: qty 48, 84d supply, fill #1

## 2022-01-07 NOTE — Progress Notes (Signed)
Subjective  CC: counseling  HPI: Alexis Banks is a 76 y.o. female who presents to the office today to address the problems listed above in the chief complaint. 76 yo struggling due to family stressors: husband with end stage copd on 6L [place] oxygen and can only walk 5 steps so is dependent now and dealing with grief/feelings of betrayal from her son regarding some private family matters. Feels sad but is trying to cope. Feels anxious given these issues. Uses ambien as needed for sleep that has been disrupted due to the stressors and caring for her husband.  Low thyroid due for recheck after adjusting down her dose of levothyroxine; she is now on a variable dosing schedule. Feels ok (outside of emotional health) HTN remains controlled.   Assessment  1. Transient adjustment reaction with anxiety   2. Acquired hypothyroidism   3. Essential hypertension      Plan  anxiety:  difficult family situation. Counseling done. Husband qualifies for hospice referral which would be helpful for him and her, however she says he would never agree to this. Start buspar to help manage anxiety Recheck tsh Htn is controlled.   Follow up:  3 mo for recheck   Orders Placed This Encounter  Procedures   TSH   Meds ordered this encounter  Medications   busPIRone (BUSPAR) 7.5 MG tablet    Sig: Take 1 tablet (7.5 mg total) by mouth 3 (three) times daily as needed.    Dispense:  60 tablet    Refill:  2      I reviewed the patients updated PMH, FH, and SocHx.    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   aspirin 81 MG tablet Take 81 mg by mouth daily.   benazepril (LOTENSIN) 40 MG tablet TAKE 1 TABLET BY MOUTH  DAILY   busPIRone (BUSPAR) 7.5 MG tablet Take 1 tablet (7.5 mg total) by mouth 3 (three) times daily as needed.   Calcium Carb-Cholecalciferol (CALCIUM-VITAMIN D3) 600-500 MG-UNIT CAPS Take 1 capsule by mouth daily.   hydrochlorothiazide (MICROZIDE) 12.5 MG capsule Take 1 capsule (12.5 mg total) by mouth daily.   lovastatin (MEVACOR) 40 MG tablet TAKE 1 TABLET BY MOUTH AT  BEDTIME   Multiple Vitamin (MULTIVITAMIN) tablet Take 1 tablet by mouth daily.   pyridoxine (B-6) 100 MG tablet Take 100 mg by mouth daily.   valACYclovir (VALTREX) 1000 MG tablet Take 1 tablet by mouth 3 times a day   Zinc Sulfate (ZINC 15 PO) Take by mouth.   zolpidem (AMBIEN) 10 MG tablet Take 1 tablet (10 mg total) by mouth at bedtime.   [DISCONTINUED]  levothyroxine (SYNTHROID) 112 MCG tablet Take 168mg daily Fri, Sat and Sun, before breakfast.   [DISCONTINUED] levothyroxine (SYNTHROID) 125 MCG tablet Take 1280m daily Mon - Thur, before breakfast    Allergies: Patient is allergic to adhesive [tape]. Family History: Patient family history includes Alcohol abuse in her mother; Alzheimer's disease in her sister; COPD in her father. Social History:  Patient  reports that she has never smoked. She has never used smokeless tobacco. She reports that she does not drink alcohol and does not use drugs.  Review of Systems: Constitutional: Negative for fever malaise or anorexia Cardiovascular: negative for chest pain Respiratory: negative for SOB or persistent cough Gastrointestinal: negative for  abdominal pain  Objective  Vitals: BP 132/80   Pulse (!) 106   Temp 98.2 F (36.8 C)   Ht '5\' 3"'$  (1.6 m)   Wt 169 lb 9.6 oz (76.9 kg)   SpO2 97%   BMI 30.04 kg/m  General: no acute distress , A&Ox3 Psych: tearful. Full affect. Good insight. Coherent speech HEENT: PEERL, conjunctiva normal, neck is supple Cardiovascular:  RRR without murmur or gallop.  Respiratory:  Good breath sounds bilaterally, CTAB with normal respiratory effort Skin:  Warm, no rashes    Commons side effects, risks, benefits, and alternatives for medications and treatment plan prescribed today were discussed, and the patient expressed understanding of the given instructions. Patient is instructed to call or message via MyChart if he/she has any questions or concerns regarding our treatment plan. No barriers to understanding were identified. We discussed Red Flag symptoms and signs in detail. Patient expressed understanding regarding what to do in case of urgent or emergency type symptoms.  Medication list was reconciled, printed and provided to the patient in AVS. Patient instructions and summary information was reviewed with the patient as documented in the AVS. This note was prepared with assistance of Dragon voice recognition software. Occasional wrong-word or sound-a-like substitutions may have occurred due to the inherent limitations of voice recognition software  This visit occurred during the SARS-CoV-2 public health emergency.  Safety protocols were in place, including screening questions prior to the visit, additional usage of staff PPE, and extensive cleaning of exam room while observing appropriate contact time as indicated for disinfecting solutions.

## 2022-01-07 NOTE — Progress Notes (Signed)
Please call patient: I have reviewed his/her lab results. Thyroid remains off; need to lower dose further.  Please take the 124mg dose MWF, and the 112 mcg dose all other days of the week.  Will need to recheck again in 6-8weeks. Can f/u in office or with lab test, her preference. Please edit the sig on her levothx meds. And order tsh if she wants lab visit. thanks

## 2022-01-08 DIAGNOSIS — M25571 Pain in right ankle and joints of right foot: Secondary | ICD-10-CM | POA: Diagnosis not present

## 2022-01-12 ENCOUNTER — Other Ambulatory Visit: Payer: Medicare Other

## 2022-01-12 ENCOUNTER — Other Ambulatory Visit (HOSPITAL_COMMUNITY): Payer: Self-pay

## 2022-01-12 MED ORDER — GABAPENTIN 100 MG PO CAPS
100.0000 mg | ORAL_CAPSULE | Freq: Three times a day (TID) | ORAL | 0 refills | Status: DC
  Filled 2022-01-12 (×2): qty 90, 30d supply, fill #0

## 2022-01-16 ENCOUNTER — Other Ambulatory Visit (HOSPITAL_COMMUNITY): Payer: Self-pay

## 2022-02-09 ENCOUNTER — Other Ambulatory Visit (HOSPITAL_COMMUNITY): Payer: Self-pay

## 2022-02-10 ENCOUNTER — Other Ambulatory Visit: Payer: Self-pay

## 2022-02-18 ENCOUNTER — Other Ambulatory Visit (INDEPENDENT_AMBULATORY_CARE_PROVIDER_SITE_OTHER): Payer: Medicare Other

## 2022-02-18 DIAGNOSIS — E039 Hypothyroidism, unspecified: Secondary | ICD-10-CM

## 2022-02-18 LAB — TSH: TSH: 1.17 u[IU]/mL (ref 0.35–5.50)

## 2022-03-01 ENCOUNTER — Other Ambulatory Visit (HOSPITAL_COMMUNITY): Payer: Self-pay

## 2022-03-12 ENCOUNTER — Other Ambulatory Visit (HOSPITAL_COMMUNITY): Payer: Self-pay

## 2022-03-12 ENCOUNTER — Other Ambulatory Visit: Payer: Self-pay

## 2022-03-13 ENCOUNTER — Other Ambulatory Visit (HOSPITAL_COMMUNITY): Payer: Self-pay

## 2022-03-17 ENCOUNTER — Telehealth: Payer: Self-pay | Admitting: Family Medicine

## 2022-03-17 NOTE — Telephone Encounter (Signed)
Contacted Alexis Banks to schedule their annual wellness visit. Appointment made for 03/18/2022.  [place] Direct Dial [phone]

## 2022-03-18 ENCOUNTER — Ambulatory Visit (INDEPENDENT_AMBULATORY_CARE_PROVIDER_SITE_OTHER): Payer: Medicare Other

## 2022-03-18 VITALS — Wt 169.0 lb

## 2022-03-18 DIAGNOSIS — Z0000 Encounter for general adult medical examination without abnormal findings: Secondary | ICD-10-CM | POA: Diagnosis not present

## 2022-03-18 DIAGNOSIS — E2839 Other primary ovarian failure: Secondary | ICD-10-CM | POA: Diagnosis not present

## 2022-03-18 NOTE — Progress Notes (Signed)
I connected with  Orville Govern Johannesen on 03/18/22 by a audio enabled telemedicine application and verified that I am speaking with the correct person using two identifiers.  Patient Location: Home  Provider Location: Office/Clinic  I discussed the limitations of evaluation and management by telemedicine. The patient expressed understanding and agreed to proceed.   Subjective:   Alexis Banks is a 77 y.o. female who presents for Medicare Annual (Subsequent) preventive examination.  Review of Systems     Cardiac Risk Factors include: advanced age (>21mn, >>57women);hypertension;dyslipidemia     Objective:    Today's Vitals   03/18/22 1404  Weight: 169 lb (76.7 kg)   Body mass index is 29.94 kg/m.     10/13/2018    2:22 PM 05/26/2017   10:21 AM 01/21/2014    9:29 PM 09/19/2013   12:49 PM 12/25/2011   11:17 PM  Advanced Directives  Does Patient Have a Medical Advance Directive? Yes Yes No No Patient does not have advance directive;Patient would not like information  Type of Advance Directive Living will;Healthcare Power of Attorney Living will;Healthcare Power of Attorney     Does patient want to make changes to medical advance directive? No - Patient declined      Copy of HHopein Chart? No - copy requested No - copy requested     Pre-existing out of facility DNR order (yellow form or pink MOST form)     No    Current Medications (verified) Outpatient Encounter Medications as of 03/18/2022  Medication Sig   amLODipine (NORVASC) 10 MG tablet Take 0.5 tablets (5 mg total) by mouth daily.   aspirin 81 MG tablet Take 81 mg by mouth daily.   benazepril (LOTENSIN) 40 MG tablet TAKE 1 TABLET BY MOUTH  DAILY   busPIRone (BUSPAR) 7.5 MG tablet Take 1 tablet (7.5 mg total) by mouth 3 (three) times daily as needed.   Calcium Carb-Cholecalciferol (CALCIUM-VITAMIN D3) 600-500 MG-UNIT CAPS Take 1 capsule by mouth daily.   ELDERBERRY PO Take by mouth.   gabapentin  (NEURONTIN) 100 MG capsule Take 1 capsule (100 mg total) by mouth 3 (three) times daily.   hydrochlorothiazide (MICROZIDE) 12.5 MG capsule Take 1 capsule (12.5 mg total) by mouth daily.   levothyroxine (SYNTHROID) 112 MCG tablet Take 1 tablet (112 mcg total) by mouth daily except on MWF   levothyroxine (SYNTHROID) 125 MCG tablet Take 1 tablet (125 mcg total) by mouth daily on Mondays, Wednesdays, and Fridays.   lovastatin (MEVACOR) 40 MG tablet TAKE 1 TABLET BY MOUTH AT  BEDTIME   Multiple Vitamin (MULTIVITAMIN) tablet Take 1 tablet by mouth daily.   Multiple Vitamins-Minerals (ZINC PO) Take 50 mg by mouth.   pyridoxine (B-6) 100 MG tablet Take 100 mg by mouth daily.   VITAMIN D, CHOLECALCIFEROL, PO Take by mouth. Vit d 3   zolpidem (AMBIEN) 10 MG tablet Take 1 tablet (10 mg total) by mouth at bedtime.   amLODipine (NORVASC) 10 MG tablet TAKE 1 TABLET BY MOUTH DAILY. (Patient not taking: Reported on 03/18/2022)   [DISCONTINUED] colestipol (COLESTID) 1 G tablet Take 1 g by mouth daily.     [DISCONTINUED] valACYclovir (VALTREX) 1000 MG tablet Take 1 tablet by mouth 3 times a day   [DISCONTINUED] Zinc Sulfate (ZINC 15 PO) Take by mouth.   No facility-administered encounter medications on file as of 03/18/2022.    Allergies (verified) Adhesive [tape]   History: Past Medical History:  Diagnosis Date  Anemia    ANXIETY DEPRESSION    Common bile duct dilation    CORONARY ARTERY DISEASE    GERD    Hyperlipidemia    Hypertension    HYPOTHYROIDISM    INSOMNIA    ROTATOR CUFF SYNDROME, RIGHT    Trochanteric bursitis of left hip    VARICOSE VEINS, LOWER EXTREMITIES    VITAMIN D DEFICIENCY    Past Surgical History:  Procedure Laterality Date   CHOLECYSTECTOMY     Rotator cuff surgery     Family History  Problem Relation Age of Onset   COPD Father    Alcohol abuse Mother    Alzheimer's disease Sister    Heart disease Neg Hx    Social History   Socioeconomic History   Marital  status: Married    Spouse name: Not on file   Number of children: 3   Years of education: Not on file   Highest education level: Not on file  Occupational History    Employer: RETIRED    Comment: Retired  Tobacco Use   Smoking status: Never   Smokeless tobacco: Never  Substance and Sexual Activity   Alcohol use: No   Drug use: No   Sexual activity: Never  Other Topics Concern   Not on file  Social History Narrative   Not on file   Social Determinants of Health   Financial Resource Strain: Low Risk  (03/18/2022)   Overall Financial Resource Strain (CARDIA)    Difficulty of Paying Living Expenses: Not hard at all  Food Insecurity: No Food Insecurity (03/18/2022)   Hunger Vital Sign    Worried About Running Out of Food in the Last Year: Never true    Ran Out of Food in the Last Year: Never true  Transportation Needs: No Transportation Needs (03/18/2022)   PRAPARE - Hydrologist (Medical): No    Lack of Transportation (Non-Medical): No  Physical Activity: Sufficiently Active (03/18/2022)   Exercise Vital Sign    Days of Exercise per Week: 7 days    Minutes of Exercise per Session: 50 min  Stress: No Stress Concern Present (03/18/2022)   [place]    Feeling of Stress : Not at all  Social Connections: Moderately Integrated (03/18/2022)   Social Connection and Isolation Panel [NHANES]    Frequency of Communication with Friends and Family: More than three times a week    Frequency of Social Gatherings with Friends and Family: More than three times a week    Attends Religious Services: 1 to 4 times per year    Active Member of [employer] or Organizations: No    Attends Music therapist: Never    Marital Status: Married    Tobacco Counseling Counseling given: Not Answered   Clinical Intake:  Pre-visit preparation completed: Yes  Pain : No/denies pain     BMI -  recorded: 29.94 Nutritional Status: BMI 25 -29 Overweight Nutritional Risks: None Diabetes: No  How often do you need to have someone help you when you read instructions, pamphlets, or other written materials from your doctor or pharmacy?: 1 - Never  Diabetic?no  Interpreter Needed?: No  Information entered by :: Charlott Rakes, LPN   Activities of Daily Living    03/18/2022    2:19 PM  In your present state of health, do you have any difficulty performing the following activities:  Hearing? 0  Vision? 0  Difficulty  concentrating or making decisions? 0  Walking or climbing stairs? 0  Dressing or bathing? 0  Doing errands, shopping? 0  Preparing Food and eating ? N  Using the Toilet? N  In the past six months, have you accidently leaked urine? N  Do you have problems with loss of bowel control? N  Managing your Medications? N  Managing your Finances? N  Housekeeping or managing your Housekeeping? N    Patient Care Team: Leamon Arnt, MD as PCP - General (Family Medicine) Lafayette Dragon, MD (Inactive) as Consulting Physician (Gastroenterology) Stanford Breed Denice Bors, MD as Consulting Physician (Cardiology) Regal, Tamala Fothergill, DPM as Consulting Physician (Podiatry) Latanya Maudlin, MD as Consulting Physician (Orthopedic Surgery) Jola Schmidt, MD as Consulting Physician (Ophthalmology) [facility] as Consulting Physician (Audiology) Madelin Rear, [facility] (Inactive) as Pharmacist (Pharmacist)  Indicate any recent Medical Services you may have received from other than [facility] providers in the past year (date may be approximate).     Assessment:   This is a routine wellness examination for Chae.  Hearing/Vision screen Hearing Screening - Comments:: Pt wears hearing aids  Vision Screening - Comments:: Pt follows up with Dr Valetta Close for annul eye exams   Dietary issues and exercise activities discussed: Current Exercise Habits: Home exercise routine, Type of  exercise: walking, Time (Minutes): 45, Frequency (Times/Week): 7, Weekly Exercise (Minutes/Week): 315   Goals Addressed             This Visit's Progress    Patient Stated       Lose weight        Depression Screen    03/18/2022    2:11 PM 11/03/2021   12:59 PM 08/29/2020    8:31 AM 12/11/2019    8:02 AM 10/13/2018    2:22 PM 05/26/2017   10:30 AM 05/26/2017    9:02 AM  PHQ 2/9 Scores  PHQ - 2 Score 0 0 0 0 0 0 0  PHQ- 9 Score       0    Fall Risk    03/18/2022    2:19 PM 01/06/2022    1:22 PM 11/03/2021   12:59 PM 08/29/2020    8:31 AM 06/05/2019    8:38 AM  [place] in the past year? 0 0 0 0 0  Number falls in past yr: 0 0 0 0 0  Injury with Fall? 0 0 0 0 0  Risk for fall due to : Impaired vision No Fall Risks No Fall Risks No Fall Risks   Follow up Falls prevention discussed Falls evaluation completed Falls evaluation completed      [place]:  Any stairs in or around the home? No  If so, are there any without handrails? No  Home free of loose throw rugs in walkways, pet beds, electrical cords, etc? Yes  Adequate lighting in your home to reduce risk of falls? Yes   ASSISTIVE DEVICES UTILIZED TO PREVENT FALLS:  Life alert? No  Use of a cane, walker or w/c? No  Grab bars in the bathroom? Yes  Shower chair or bench in shower? Yes  Elevated toilet seat or a handicapped toilet? Yes   TIMED UP AND GO:  Was the test performed? No .  Cognitive Function:    05/26/2017   10:23 AM  MMSE - Mini Mental State Exam  Orientation to time 5  Orientation to Place 5  Registration 3  Attention/ Calculation  5  Recall 1  Language- name 2 objects 2  Language- repeat 1  Language- follow 3 step command 3  Language- read & follow direction 1  Write a sentence 1  Copy design 1  Total score 28        03/18/2022    2:20 PM  6CIT Screen  What Year? 0 points  What month? 0 points  What time? 0 points  Count back from 20 0  points  Months in reverse 0 points  Repeat phrase 0 points  Total Score 0 points    Immunizations Immunization History  Administered Date(s) Administered   Fluad Quad(high Dose 65+) 10/15/2015, 10/26/2016, 10/19/2019, 10/14/2020, 10/06/2021   Influenza, High Dose Seasonal PF 10/07/2012, 10/15/2014, 10/15/2015, 10/26/2016, 10/13/2017   Influenza,inj,Quad PF,6+ Mos 10/25/2013   Influenza-Unspecified 10/14/2018   PFIZER(Purple Top)SARS-COV-2 Vaccination 02/07/2019, 02/28/2019   Pneumococcal Conjugate-13 11/19/2014   Pneumococcal Polysaccharide-23 08/09/2011, 10/28/2011   Tdap 07/30/2008, 06/19/2015   Zoster Recombinat (Shingrix) 05/08/2021, 08/20/2021   Zoster, Live 09/09/2011    TDAP status: Up to date  Flu Vaccine status: Up to date  Pneumococcal vaccine status: Up to date  Covid-19 vaccine status: Completed vaccines  Qualifies for Shingles Vaccine? Yes   Zostavax completed Yes   Shingrix Completed?: Yes  Screening Tests Health Maintenance  Topic Date Due   DEXA SCAN  08/23/2021   MAMMOGRAM  10/29/2022   Medicare Annual Wellness (AWV)  03/18/2023   DTaP/Tdap/Td (3 - Td or Tdap) 06/18/2025   Pneumonia Vaccine 24+ Years old  Completed   INFLUENZA VACCINE  Completed   Hepatitis C Screening  Completed   Zoster Vaccines- Shingrix  Completed   HPV VACCINES  Aged Out   COLONOSCOPY (Pts 45-13yr Insurance coverage will need to be confirmed)  Discontinued   COVID-19 Vaccine  Discontinued    Health Maintenance  Health Maintenance Due  Topic Date Due   DEXA SCAN  08/23/2021    Colorectal cancer screening: No longer required.   Mammogram status: Completed 10/28/21. Repeat every year  Bone Density status: Completed 08/24/19. Results reflect: Bone density results: OSTEOPENIA. Repeat every 2 years.   Additional Screening:  Hepatitis C Screening: Completed 06/18/15  Vision Screening: Recommended annual ophthalmology exams for early detection of glaucoma and other  disorders of the eye. Is the patient up to date with their annual eye exam?  Yes  Who is the provider or what is the name of the office in which the patient attends annual eye exams? Dr BValetta Close If pt is not established with a provider, would they like to be referred to a provider to establish care? No .   Dental Screening: Recommended annual dental exams for proper oral hygiene  Community Resource Referral / Chronic Care Management: CRR required this visit?  No   CCM required this visit?  No      Plan:     I have personally reviewed and noted the following in the patient's chart:   Medical and social history Use of alcohol, tobacco or illicit drugs  Current medications and supplements including opioid prescriptions. Patient is not currently taking opioid prescriptions. Functional ability and status Nutritional status Physical activity Advanced directives List of other physicians Hospitalizations, surgeries, and ER visits in previous 12 months Vitals Screenings to include cognitive, depression, and falls Referrals and appointments  In addition, I have reviewed and discussed with patient certain preventive protocols, quality metrics, and best practice recommendations. A written personalized care plan for preventive services as well as  general preventive health recommendations were provided to patient.     Willette Brace, LPN   [id number]   Nurse Notes: none

## 2022-03-18 NOTE — Patient Instructions (Signed)
[name] , Thank you for taking time to come for your Medicare Wellness Visit. I appreciate your ongoing commitment to your health goals. Please review the following plan we discussed and let me know if I can assist you in the future.   These are the goals we discussed:  Goals      Patient Stated     Continue to stay healthy.      Patient Stated     Lose weight      Weight (lb) < 165 lb (74.8 kg)     Lose weight by increasing activity.         This is a list of the screening recommended for you and due dates:  Health Maintenance  Topic Date Due   DEXA scan (bone density measurement)  08/23/2021   Mammogram  10/29/2022   Medicare Annual Wellness Visit  03/18/2023   DTaP/Tdap/Td vaccine (3 - Td or Tdap) 06/18/2025   Pneumonia Vaccine  Completed   Flu Shot  Completed   Hepatitis C Screening: USPSTF Recommendation to screen - Ages 18-79 yo.  Completed   Zoster (Shingles) Vaccine  Completed   HPV Vaccine  Aged Out   Colon Cancer Screening  Discontinued   COVID-19 Vaccine  Discontinued    Advanced directives: Advance directive discussed with you today. Even though you declined this today please call our office should you change your mind and we can give you the proper paperwork for you to fill out.  Conditions/risks identified: lose weight   Next appointment: Follow up in one year for your annual wellness visit    Preventive Care 65 Years and Older, Female Preventive care refers to lifestyle choices and visits with your health care provider that can promote health and wellness. What does preventive care include? A yearly physical exam. This is also called an annual well check. Dental exams once or twice a year. Routine eye exams. Ask your health care provider how often you should have your eyes checked. Personal lifestyle choices, including: Daily care of your teeth and gums. Regular physical activity. Eating a healthy diet. Avoiding tobacco and drug use. Limiting  alcohol use. Practicing safe sex. Taking low-dose aspirin every day. Taking vitamin and mineral supplements as recommended by your health care provider. What happens during an annual well check? The services and screenings done by your health care provider during your annual well check will depend on your age, overall health, lifestyle risk factors, and family history of disease. Counseling  Your health care provider may ask you questions about your: Alcohol use. Tobacco use. Drug use. Emotional well-being. Home and relationship well-being. Sexual activity. Eating habits. History of falls. Memory and ability to understand (cognition). Work and work Statistician. Reproductive health. Screening  You may have the following tests or measurements: Height, weight, and BMI. Blood pressure. Lipid and cholesterol levels. These may be checked every 5 years, or more frequently if you are over 80 years old. Skin check. Lung cancer screening. You may have this screening every year starting at age 17 if you have a 30-pack-year history of smoking and currently smoke or have quit within the past 15 years. Fecal occult blood test (FOBT) of the stool. You may have this test every year starting at age 41. Flexible sigmoidoscopy or colonoscopy. You may have a sigmoidoscopy every 5 years or a colonoscopy every 10 years starting at age 24. Hepatitis C blood test. Hepatitis B blood test. Sexually transmitted disease (STD) testing. Diabetes screening. This is  done by checking your blood sugar (glucose) after you have not eaten for a while (fasting). You may have this done every 1-3 years. Bone density scan. This is done to screen for osteoporosis. You may have this done starting at age 17. Mammogram. This may be done every 1-2 years. Talk to your health care provider about how often you should have regular mammograms. Talk with your health care provider about your test results, treatment options, and if  necessary, the need for more tests. Vaccines  Your health care provider may recommend certain vaccines, such as: Influenza vaccine. This is recommended every year. Tetanus, diphtheria, and acellular pertussis (Tdap, Td) vaccine. You may need a Td booster every 10 years. Zoster vaccine. You may need this after age 49. Pneumococcal 13-valent conjugate (PCV13) vaccine. One dose is recommended after age 70. Pneumococcal polysaccharide (PPSV23) vaccine. One dose is recommended after age 22. Talk to your health care provider about which screenings and vaccines you need and how often you need them. This information is not intended to replace advice given to you by your health care provider. Make sure you discuss any questions you have with your health care provider. Document Released: 01/31/2015 Document Revised: 09/24/2015 Document Reviewed: 11/05/2014 Elsevier Interactive Patient Education  2017 [place] Prevention in the Home Falls can cause injuries. They can happen to people of all ages. There are many things you can do to make your home safe and to help prevent falls. What can I do on the outside of my home? Regularly fix the edges of walkways and driveways and fix any cracks. Remove anything that might make you trip as you walk through a door, such as a raised step or threshold. Trim any bushes or trees on the path to your home. Use bright outdoor lighting. Clear any walking paths of anything that might make someone trip, such as rocks or tools. Regularly check to see if handrails are loose or broken. Make sure that both sides of any steps have handrails. Any raised decks and porches should have guardrails on the edges. Have any leaves, snow, or ice cleared regularly. Use sand or salt on walking paths during winter. Clean up any spills in your garage right away. This includes oil or grease spills. What can I do in the bathroom? Use night lights. Install grab bars by the toilet  and in the tub and shower. Do not use towel bars as grab bars. Use non-skid mats or decals in the tub or shower. If you need to sit down in the shower, use a plastic, non-slip stool. Keep the floor dry. Clean up any water that spills on the floor as soon as it happens. Remove soap buildup in the tub or shower regularly. Attach bath mats securely with double-sided non-slip rug tape. Do not have throw rugs and other things on the floor that can make you trip. What can I do in the bedroom? Use night lights. Make sure that you have a light by your bed that is easy to reach. Do not use any sheets or blankets that are too big for your bed. They should not hang down onto the floor. Have a firm chair that has side arms. You can use this for support while you get dressed. Do not have throw rugs and other things on the floor that can make you trip. What can I do in the kitchen? Clean up any spills right away. Avoid walking on wet floors. Keep items that you  use a lot in easy-to-reach places. If you need to reach something above you, use a strong step stool that has a grab bar. Keep electrical cords out of the way. Do not use floor polish or wax that makes floors slippery. If you must use wax, use non-skid floor wax. Do not have throw rugs and other things on the floor that can make you trip. What can I do with my stairs? Do not leave any items on the stairs. Make sure that there are handrails on both sides of the stairs and use them. Fix handrails that are broken or loose. Make sure that handrails are as long as the stairways. Check any carpeting to make sure that it is firmly attached to the stairs. Fix any carpet that is loose or worn. Avoid having throw rugs at the top or bottom of the stairs. If you do have throw rugs, attach them to the floor with carpet tape. Make sure that you have a light switch at the top of the stairs and the bottom of the stairs. If you do not have them, ask someone to add  them for you. What else can I do to help prevent falls? Wear shoes that: Do not have high heels. Have rubber bottoms. Are comfortable and fit you well. Are closed at the toe. Do not wear sandals. If you use a stepladder: Make sure that it is fully opened. Do not climb a closed stepladder. Make sure that both sides of the stepladder are locked into place. Ask someone to hold it for you, if possible. Clearly mark and make sure that you can see: Any grab bars or handrails. First and last steps. Where the edge of each step is. Use tools that help you move around (mobility aids) if they are needed. These include: Canes. Walkers. Scooters. Crutches. Turn on the lights when you go into a dark area. Replace any light bulbs as soon as they burn out. Set up your furniture so you have a clear path. Avoid moving your furniture around. If any of your floors are uneven, fix them. If there are any pets around you, be aware of where they are. Review your medicines with your doctor. Some medicines can make you feel dizzy. This can increase your chance of falling. Ask your doctor what other things that you can do to help prevent falls. This information is not intended to replace advice given to you by your health care provider. Make sure you discuss any questions you have with your health care provider. Document Released: 10/31/2008 Document Revised: 06/12/2015 Document Reviewed: 02/08/2014 Elsevier Interactive Patient Education  2017 [employer].

## 2022-04-01 ENCOUNTER — Other Ambulatory Visit: Payer: Self-pay

## 2022-04-01 ENCOUNTER — Other Ambulatory Visit (HOSPITAL_COMMUNITY): Payer: Self-pay

## 2022-04-01 ENCOUNTER — Telehealth: Payer: Self-pay | Admitting: Family Medicine

## 2022-04-01 MED ORDER — AMLODIPINE BESYLATE 5 MG PO TABS
5.0000 mg | ORAL_TABLET | Freq: Every day | ORAL | 3 refills | Status: DC
  Filled 2022-04-01: qty 90, 90d supply, fill #0
  Filled 2022-08-11: qty 90, 90d supply, fill #1
  Filled 2022-12-09 (×2): qty 90, 90d supply, fill #2

## 2022-04-01 NOTE — Telephone Encounter (Signed)
Patient states it is very difficult to cut the following medication in half: amLODipine (NORVASC) 10 MG tablet   States Pharmacy has the above medication in 5 MG tablets.  Requests RX for 5 MG for the above medication to be sent to:  [facility] Phone: [phone]  Fax: [phone]

## 2022-04-03 IMAGING — MG MM DIGITAL SCREENING BILAT W/ TOMO AND CAD
8 series · 8 of 24 positions shown · non-contrast
Comparison: Previous exam(s).

ACR Breast Density Category a: The breast tissue is almost entirely
fatty.

CLINICAL DATA: Screening.

EXAM:
DIGITAL SCREENING BILATERAL MAMMOGRAM WITH TOMOSYNTHESIS AND CAD
TECHNIQUE: Bilateral screening digital craniocaudal and mediolateral oblique
mammograms were obtained. Bilateral screening digital breast
tomosynthesis was performed. The images were evaluated with
computer-aided detection.

[L CC synth-2D]
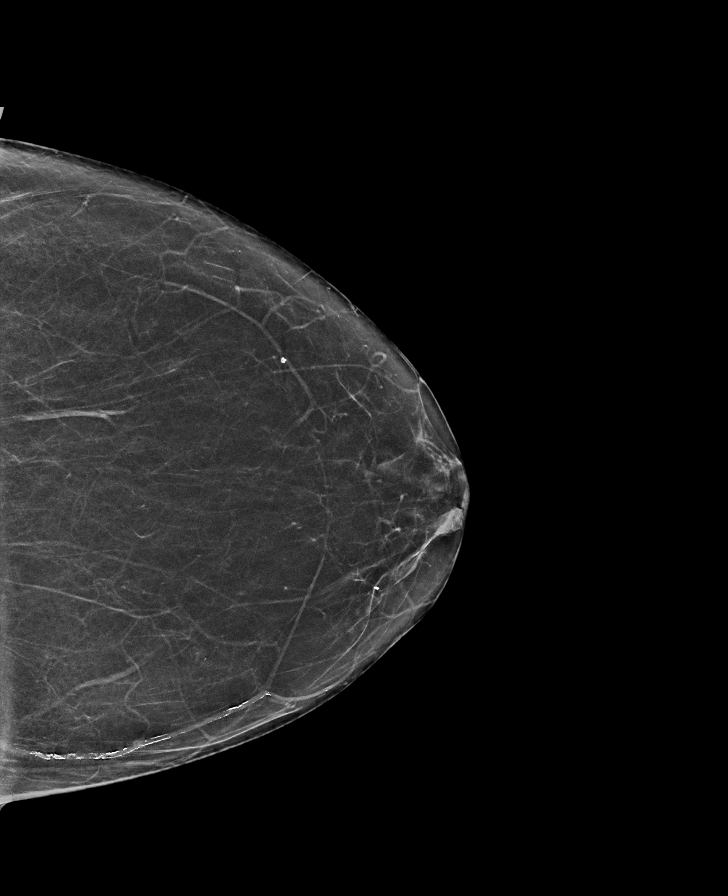

[R CC synth-2D]
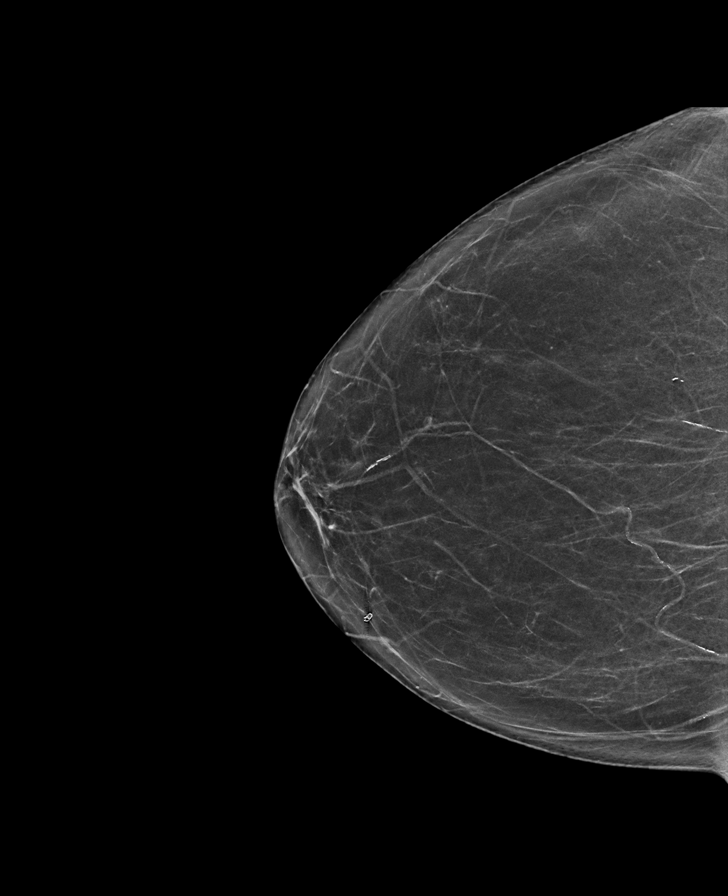

[L MLO synth-2D]
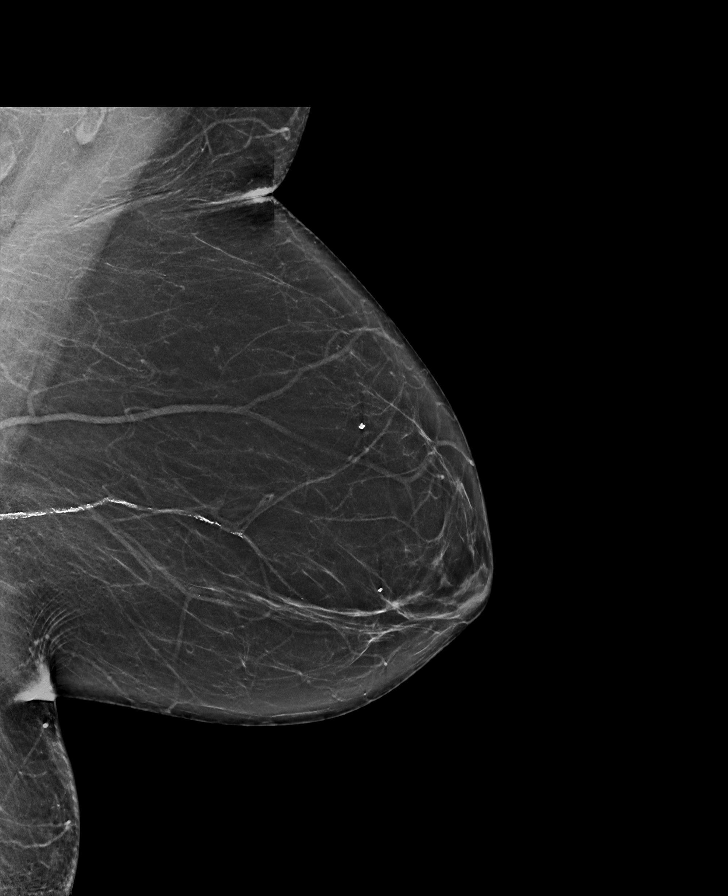

[R MLO synth-2D]
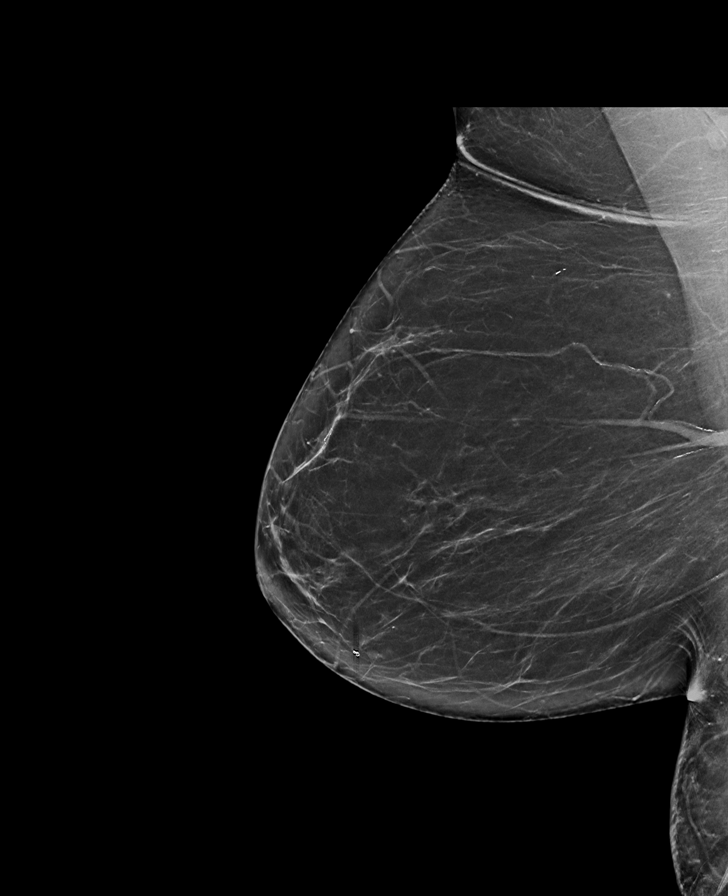

[R CC tomo · tomo slice 31/61.0]
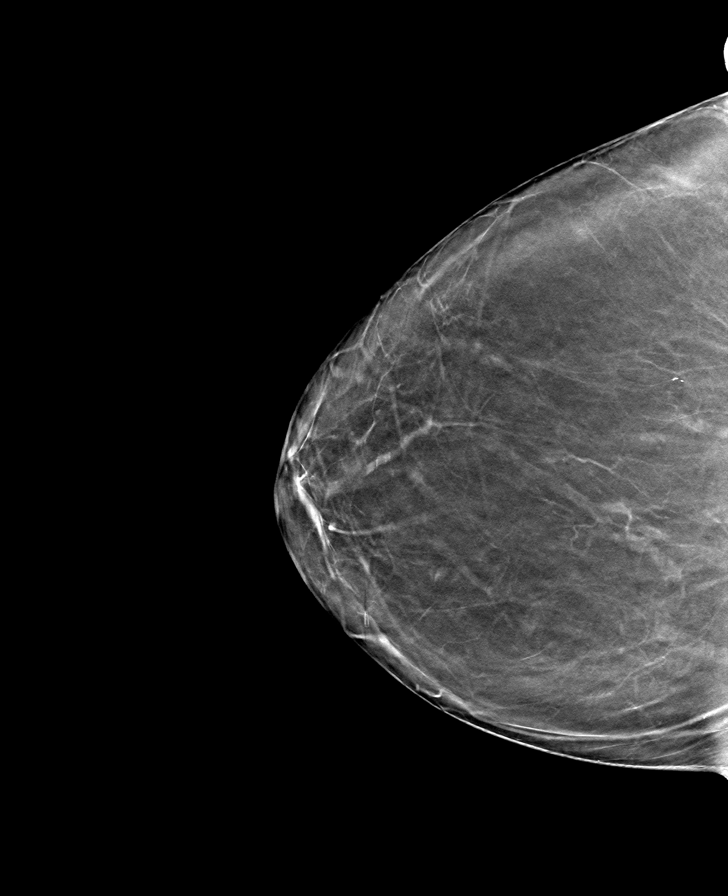

[L MLO tomo · tomo slice 35/70.0]
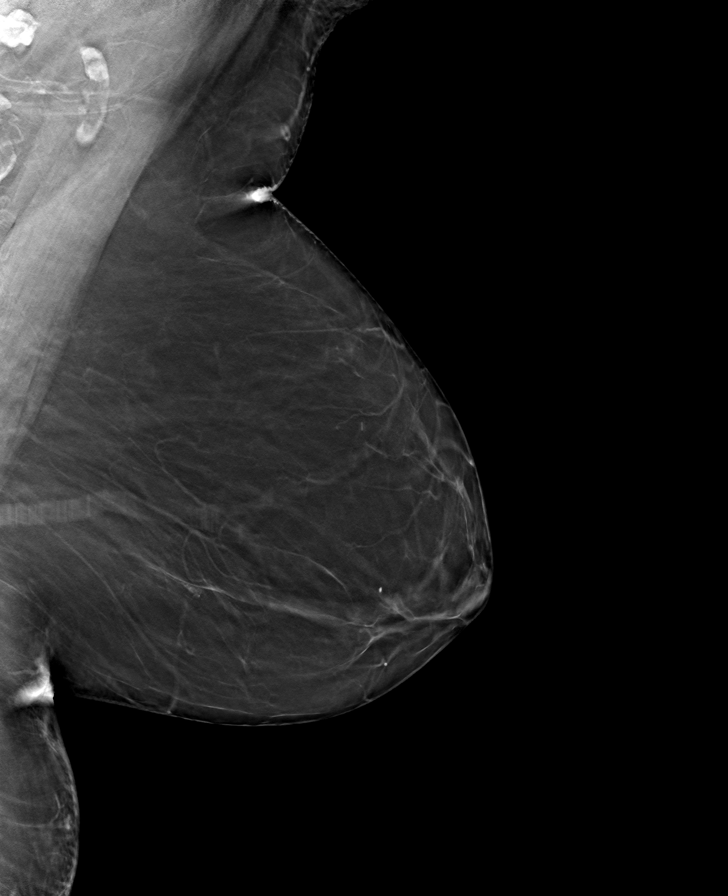

[L CC tomo · tomo slice 30/59.0]
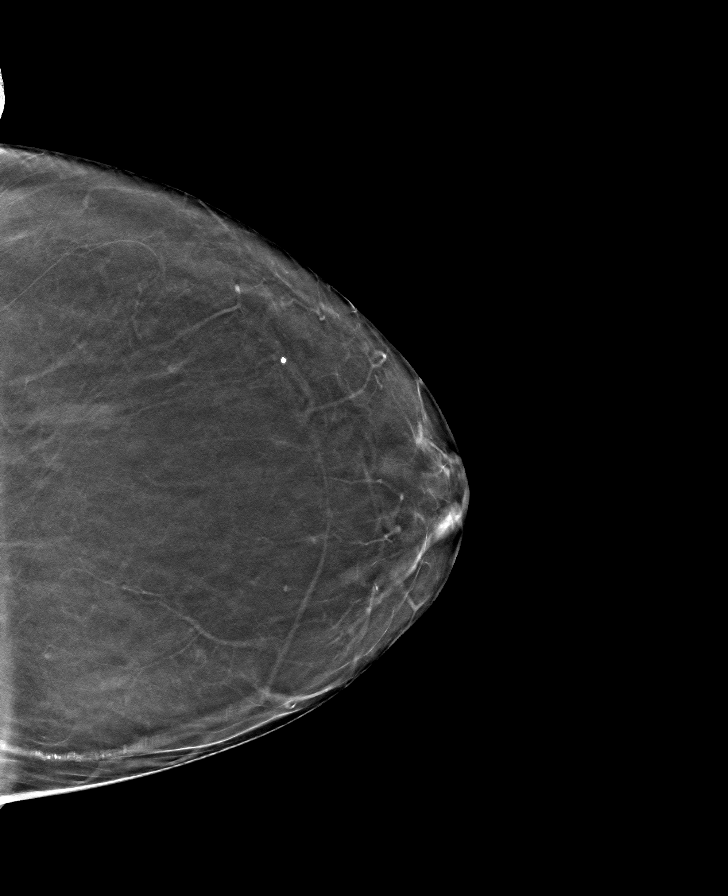

[R MLO tomo · tomo slice 35/69.0]
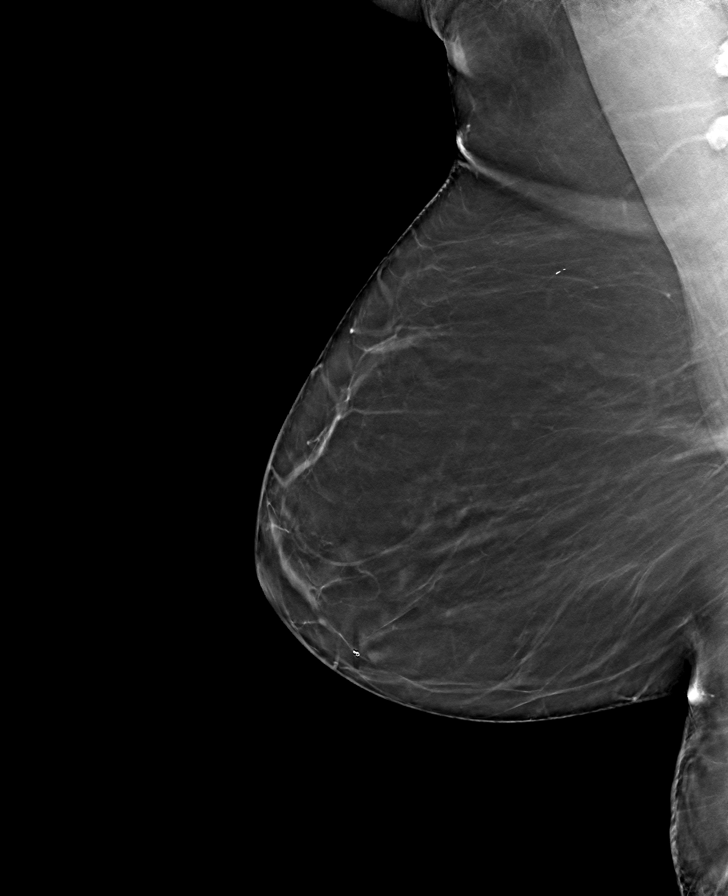

[8 of 24 positions shown; findings below may reference images not displayed]

FINDINGS: There are no findings suspicious for malignancy.
IMPRESSION: No mammographic evidence of malignancy. A result letter of this
screening mammogram will be mailed directly to the patient.

RECOMMENDATION:
Screening mammogram in one year. (Code:0E-[id number])

BI-RADS CATEGORY  1: Negative.

## 2022-04-10 ENCOUNTER — Other Ambulatory Visit (HOSPITAL_COMMUNITY): Payer: Self-pay

## 2022-04-20 ENCOUNTER — Other Ambulatory Visit: Payer: Self-pay | Admitting: Family Medicine

## 2022-04-30 ENCOUNTER — Other Ambulatory Visit (HOSPITAL_COMMUNITY): Payer: Self-pay

## 2022-05-05 ENCOUNTER — Other Ambulatory Visit (HOSPITAL_COMMUNITY): Payer: Self-pay

## 2022-05-05 ENCOUNTER — Encounter: Payer: Self-pay | Admitting: Family Medicine

## 2022-05-05 ENCOUNTER — Other Ambulatory Visit: Payer: Self-pay

## 2022-05-05 ENCOUNTER — Ambulatory Visit (INDEPENDENT_AMBULATORY_CARE_PROVIDER_SITE_OTHER): Payer: Medicare Other | Admitting: Family Medicine

## 2022-05-05 VITALS — BP 112/64 | HR 67 | Temp 97.6°F | Ht 63.0 in | Wt 168.6 lb

## 2022-05-05 DIAGNOSIS — E039 Hypothyroidism, unspecified: Secondary | ICD-10-CM | POA: Diagnosis not present

## 2022-05-05 DIAGNOSIS — F5101 Primary insomnia: Secondary | ICD-10-CM | POA: Diagnosis not present

## 2022-05-05 DIAGNOSIS — I10 Essential (primary) hypertension: Secondary | ICD-10-CM

## 2022-05-05 DIAGNOSIS — F4322 Adjustment disorder with anxiety: Secondary | ICD-10-CM

## 2022-05-05 MED ORDER — ZOLPIDEM TARTRATE 10 MG PO TABS
10.0000 mg | ORAL_TABLET | Freq: Every day | ORAL | 5 refills | Status: DC
  Filled 2022-05-05 – 2022-05-12 (×3): qty 30, 30d supply, fill #0
  Filled 2022-06-09: qty 30, 30d supply, fill #1
  Filled 2022-07-14 (×2): qty 30, 30d supply, fill #2
  Filled 2022-08-09 – 2022-08-10 (×2): qty 30, 30d supply, fill #3
  Filled 2022-09-13 (×2): qty 30, 30d supply, fill #4
  Filled 2022-10-18: qty 30, 30d supply, fill #5

## 2022-05-05 MED ORDER — LEVOTHYROXINE SODIUM 112 MCG PO TABS: ORAL_TABLET | ORAL | 3 refills | Status: DC

## 2022-05-05 MED ORDER — BUSPIRONE HCL 7.5 MG PO TABS: 15.0000 mg | ORAL_TABLET | Freq: Two times a day (BID) | ORAL | 2 refills | Status: DC | PRN

## 2022-05-05 MED ORDER — ESCITALOPRAM OXALATE 10 MG PO TABS
10.0000 mg | ORAL_TABLET | Freq: Every day | ORAL | 2 refills | Status: DC
  Filled 2022-05-05: qty 30, 30d supply, fill #0
  Filled 2022-06-09: qty 30, 30d supply, fill #1

## 2022-05-05 NOTE — Progress Notes (Signed)
Subjective  CC:  Chief Complaint  Patient presents with   Hypertension   Hypothyroidism   Anxiety    HPI: Alexis Banks is a 77 y.o. female who presents to the office today to address the problems listed above in the chief complaint. Hypertension f/u: Control is good . Pt reports she is doing well. taking medications as instructed, no medication side effects noted, no TIAs, no chest pain on exertion, no dyspnea on exertion, no swelling of ankles. She denies adverse effects from his BP medications. Compliance with medication is good.  Insomnia: chronic ambien. Remains stable. Due refill Anxiety/mood: Still struggling a bit.  Tried BuSpar 7.5 mg 3 times daily without any real effects or benefit.  Continues to struggle with anxiety.  Husband with severe end-stage lung disease on 10 L of oxygen now and very debilitated.  His mood is quite negative understandably.  Family is struggling.  This stresses Murel out.  She is functioning okay but would like more help.  No history of depression or anxiety has never been on SSRI before. Hypothyroidism: Recently reviewed after dose adjustment and now at goal.  Energetically feels fine.  Taking variable dosing as listed below.  Assessment  1. Essential hypertension   2. Acquired hypothyroidism   3. Transient adjustment reaction with anxiety   4. Primary insomnia      Plan   Hypertension f/u: BP control is well controlled.  Continue blood pressure medications Hypothyroidism now at goal.  Continue current dosing.  See below Counseling done for mood control: Increase BuSpar to 15 mg to see if that dose will be helpful.  Start Lexapro 10 mg daily.  Education given on expectations and appropriate use Continue Ambien 10 mg nightly. HM: due dexa  Outpatient Encounter Medications as of 05/05/2022  Medication Sig   amLODipine (NORVASC) 5 MG tablet Take 1 tablet (5 mg total) by mouth daily.   aspirin 81 MG tablet Take 81 mg by mouth daily.    benazepril (LOTENSIN) 40 MG tablet TAKE 1 TABLET BY MOUTH DAILY   Calcium Carb-Cholecalciferol (CALCIUM-VITAMIN D3) 600-500 MG-UNIT CAPS Take 1 capsule by mouth daily.   ELDERBERRY PO Take by mouth.   escitalopram (LEXAPRO) 10 MG tablet Take 1 tablet (10 mg total) by mouth daily.   hydrochlorothiazide (MICROZIDE) 12.5 MG capsule Take 1 capsule (12.5 mg total) by mouth daily.   levothyroxine (SYNTHROID) 125 MCG tablet Take 1 tablet (125 mcg total) by mouth daily on Mondays, Wednesdays, and Fridays.   lovastatin (MEVACOR) 40 MG tablet TAKE 1 TABLET BY MOUTH AT  BEDTIME   Multiple Vitamin (MULTIVITAMIN) tablet Take 1 tablet by mouth daily.   Multiple Vitamins-Minerals (ZINC PO) Take 50 mg by mouth.   pyridoxine (B-6) 100 MG tablet Take 100 mg by mouth daily.   VITAMIN D, CHOLECALCIFEROL, PO Take by mouth. Vit d 3   [DISCONTINUED] levothyroxine (SYNTHROID) 112 MCG tablet Take 1 tablet (112 mcg total) by mouth daily except on MWF   [DISCONTINUED] zolpidem (AMBIEN) 10 MG tablet Take 1 tablet (10 mg total) by mouth at bedtime.   busPIRone (BUSPAR) 7.5 MG tablet Take 2 tablets (15 mg total) by mouth 2 (two) times daily as needed.   levothyroxine (SYNTHROID) 112 MCG tablet Take T,Th,Sat, and Sun   zolpidem (AMBIEN) 10 MG tablet Take 1 tablet (10 mg total) by mouth at bedtime.   [DISCONTINUED] busPIRone (BUSPAR) 7.5 MG tablet Take 1 tablet (7.5 mg total) by mouth 3 (three) times daily as  needed.   [DISCONTINUED] colestipol (COLESTID) 1 G tablet Take 1 g by mouth daily.     [DISCONTINUED] gabapentin (NEURONTIN) 100 MG capsule Take 1 capsule (100 mg total) by mouth 3 (three) times daily.   No facility-administered encounter medications on file as of 05/05/2022.    Education regarding management of these chronic disease states was given. Management strategies discussed on successive visits include dietary and exercise recommendations, goals of achieving and maintaining IBW, and lifestyle  modifications aiming for adequate sleep and minimizing stressors.   Follow up: 6 to 8 weeks to recheck mood  No orders of the defined types were placed in this encounter.  Meds ordered this encounter  Medications   escitalopram (LEXAPRO) 10 MG tablet    Sig: Take 1 tablet (10 mg total) by mouth daily.    Dispense:  30 tablet    Refill:  2   busPIRone (BUSPAR) 7.5 MG tablet    Sig: Take 2 tablets (15 mg total) by mouth 2 (two) times daily as needed.    Dispense:  60 tablet    Refill:  2   levothyroxine (SYNTHROID) 112 MCG tablet    Sig: Take T,Th,Sat, and Sun    Dispense:  90 tablet    Refill:  3   zolpidem (AMBIEN) 10 MG tablet    Sig: Take 1 tablet (10 mg total) by mouth at bedtime.    Dispense:  30 tablet    Refill:  5      BP Readings from Last 3 Encounters:  05/05/22 112/64  01/06/22 132/80  11/03/21 132/80   Wt Readings from Last 3 Encounters:  05/05/22 168 lb 9.6 oz (76.5 kg)  03/18/22 169 lb (76.7 kg)  01/06/22 169 lb 9.6 oz (76.9 kg)    Lab Results  Component Value Date   CHOL 135 11/03/2021   CHOL 131 11/20/2020   CHOL 135 12/11/2019   Lab Results  Component Value Date   HDL 50.30 11/03/2021   HDL 54.80 11/20/2020   HDL 57 12/11/2019   Lab Results  Component Value Date   LDLCALC 53 11/03/2021   LDLCALC 54 11/20/2020   LDLCALC 58 12/11/2019   Lab Results  Component Value Date   TRIG 158.0 (H) 11/03/2021   TRIG 112.0 11/20/2020   TRIG 123 12/11/2019   Lab Results  Component Value Date   CHOLHDL 3 11/03/2021   CHOLHDL 2 11/20/2020   CHOLHDL 2.4 12/11/2019   Lab Results  Component Value Date   LDLDIRECT 112.6 11/07/2008   LDLDIRECT 136.6 01/09/2007   Lab Results  Component Value Date   CREATININE 0.92 11/03/2021   BUN 16 11/03/2021   NA 139 11/03/2021   K 3.8 11/03/2021   CL 105 11/03/2021   CO2 26 11/03/2021    The 10-year ASCVD risk score (Arnett DK, et al., 2019) is: 22.4%   Values used to calculate the score:      Age: 61 years     Sex: Female     Is Non-Hispanic African American: No     Diabetic: No     Tobacco smoker: No     Systolic Blood Pressure: 112 mmHg     Is BP treated: Yes     HDL Cholesterol: 50.3 mg/dL     Total Cholesterol: 135 mg/dL  I reviewed the patients updated PMH, FH, and SocHx.    Patient Active Problem List   Diagnosis Date Noted   Obesity (BMI 30-39.9) 11/10/2012    Priority:  High   Acquired hypothyroidism 01/09/2007    Priority: High   Mixed hyperlipidemia 01/09/2007    Priority: High   Essential hypertension 01/09/2007    Priority: High   Transient adjustment reaction with anxiety 05/05/2022    Priority: Medium    Irritable bowel syndrome with diarrhea 06/17/2015    Priority: Medium    Osteopenia 09/10/2014    Priority: Medium    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 She has never used smokeless tobacco. She reports that she does not drink alcohol and does not use drugs.  Current Meds  Medication Sig   amLODipine (NORVASC) 5 MG tablet Take 1 tablet (5 mg total) by mouth daily.   aspirin 81 MG tablet Take 81 mg by mouth daily.   benazepril (LOTENSIN) 40 MG tablet TAKE 1 TABLET BY MOUTH DAILY   Calcium Carb-Cholecalciferol (CALCIUM-VITAMIN D3) 600-500 MG-UNIT CAPS Take 1 capsule by mouth daily.   ELDERBERRY PO Take by mouth.   escitalopram (LEXAPRO) 10 MG tablet Take 1 tablet (10 mg total) by mouth daily.   hydrochlorothiazide (MICROZIDE) 12.5 MG capsule Take 1 capsule (12.5 mg total) by mouth daily.   levothyroxine (SYNTHROID) 125 MCG tablet Take 1 tablet (125 mcg total) by mouth daily on Mondays, Wednesdays, and Fridays.   lovastatin (MEVACOR) 40 MG  tablet TAKE 1 TABLET BY MOUTH AT  BEDTIME   Multiple Vitamin (MULTIVITAMIN) tablet Take 1 tablet by mouth daily.   Multiple Vitamins-Minerals (ZINC PO) Take 50 mg by mouth.   pyridoxine (B-6) 100 MG tablet Take 100 mg by mouth daily.   VITAMIN D, CHOLECALCIFEROL, PO Take by mouth. Vit d 3   [DISCONTINUED] levothyroxine (SYNTHROID) 112 MCG tablet Take 1 tablet (112 mcg total) by mouth daily except on MWF   [DISCONTINUED] zolpidem (AMBIEN) 10 MG tablet Take 1 tablet (10 mg total) by mouth at bedtime.    Review of Systems: Cardiovascular: negative for chest pain, palpitations, leg swelling, orthopnea Respiratory: negative for SOB, wheezing or persistent cough Gastrointestinal: negative for abdominal pain Genitourinary: negative for dysuria or gross hematuria  Objective  Vitals: BP 112/64   Pulse 67   Temp 97.6 F (36.4 C)   Ht  (1.6 m)   Wt 168 lb 9.6 oz (76.5 kg)   SpO2 95%   BMI 29.87 kg/m  General: no acute distress tearful at times Psych:  Alert and oriented, normal mood and affect HEENT:  Normocephalic, atraumatic, supple neck  Cardiovascular:  RRR without murmur. no edema Respiratory:  Good breath sounds bilaterally, CTAB with normal respiratory effort Skin:  Warm, no rashes Neurologic:   Mental status is normal Commons side effects, risks, benefits, and alternatives for medications and treatment plan prescribed today were discussed, and the patient expressed understanding of the given instructions. Patient is instructed to call or message via MyChart if he/she has any questions or concerns regarding our treatment plan. No barriers to understanding were identified. We discussed Red Flag symptoms and signs in detail. Patient expressed understanding regarding what to do in case of urgent or emergency type symptoms.  Medication list was reconciled, printed and provided to  the patient in AVS. Patient instructions and summary information was reviewed with the patient as documented  in the AVS. This note was prepared with assistance of Dragon voice recognition software. Occasional wrong-word or sound-a-like substitutions may have occurred due to the inherent limitation

## 2022-05-05 NOTE — Patient Instructions (Signed)
Please return in 6-8 weeks to recheck mood.  If you have any questions or concerns, please don't hesitate to send me a message via MyChart or call the office at [phone]. Thank you for visiting with [place] today! It's our pleasure caring for you.   You are due a:   Mammogram    Bone Density  Please call the office checked below to schedule your appointment:    [facility]      [address] [place], [place]        [phone]           [facility]  [address] [place], [place]  [phone]

## 2022-05-06 ENCOUNTER — Other Ambulatory Visit (HOSPITAL_COMMUNITY): Payer: Self-pay

## 2022-05-11 ENCOUNTER — Other Ambulatory Visit: Payer: Self-pay | Admitting: Family Medicine

## 2022-05-12 ENCOUNTER — Other Ambulatory Visit: Payer: Self-pay

## 2022-05-12 ENCOUNTER — Other Ambulatory Visit (HOSPITAL_COMMUNITY): Payer: Self-pay

## 2022-05-19 ENCOUNTER — Other Ambulatory Visit: Payer: Self-pay

## 2022-06-09 ENCOUNTER — Other Ambulatory Visit (HOSPITAL_COMMUNITY): Payer: Self-pay

## 2022-06-09 ENCOUNTER — Other Ambulatory Visit (HOSPITAL_BASED_OUTPATIENT_CLINIC_OR_DEPARTMENT_OTHER): Payer: Self-pay

## 2022-06-16 ENCOUNTER — Ambulatory Visit (INDEPENDENT_AMBULATORY_CARE_PROVIDER_SITE_OTHER): Payer: Medicare Other | Admitting: Family Medicine

## 2022-06-16 ENCOUNTER — Encounter: Payer: Self-pay | Admitting: Family Medicine

## 2022-06-16 ENCOUNTER — Other Ambulatory Visit (HOSPITAL_COMMUNITY): Payer: Self-pay

## 2022-06-16 VITALS — BP 100/62 | HR 80 | Temp 97.9°F | Ht 63.0 in | Wt 163.2 lb

## 2022-06-16 DIAGNOSIS — F4322 Adjustment disorder with anxiety: Secondary | ICD-10-CM

## 2022-06-16 MED ORDER — ALPRAZOLAM 0.5 MG PO TABS
0.2500 mg | ORAL_TABLET | Freq: Every day | ORAL | 0 refills | Status: DC | PRN
  Filled 2022-06-16 (×2): qty 30, 30d supply, fill #0

## 2022-06-16 NOTE — Patient Instructions (Signed)
Please return in 6 months for your annual complete physical; please come fasting.    Please call [facility] to schedule an appointment with Dr. Hollie Salk; she is a therapist here at our [place] office.  The phone number is: [phone]  Stop the buspar and lexapro. You may use xanax cautiously as needed to manage your anxiety/stress.  If you have any questions or concerns, please don't hesitate to send me a message via MyChart or call the office at [phone]. Thank you for visiting with Alexis Banks today! It's our pleasure caring for you.

## 2022-06-16 NOTE — Progress Notes (Signed)
Subjective  CC:  Chief Complaint  Patient presents with   Anxiety    HPI: Alexis Banks is a 77 y.o. female who presents to the office today to address the problems listed above in the chief complaint, mood problems. See last notes.  Started Lexapro 10 daily and increase BuSpar from 7.5 3 times daily to 15 mg twice daily/3 times daily.  Unfortunately, neither of these changes have made any difference.  She continues to struggle with stress and anxiety.  Her husband's medical condition continues to deteriorate.  End-stage COPD.  Patient describes symptoms of anxiety, stress, mildly tremulous at times now.  She is frustrated and angry.  Has guilt.    Assessment  1. Transient adjustment reaction with anxiety      Plan  Anxiety symptoms/stress reaction: Counseling done.  Unfortunately medications have not been very helpful.  Given her current symptoms, I recommend counseling instead.  She may try Xanax 0.25 mg as needed for tremulous, shakiness, panic symptoms.  Cautioned against regular use and discussed possible side effects.  High risk medication.  Patient understands.  She will stop Lexapro and BuSpar.   Follow up: 6 months for complete physical No orders of the defined types were placed in this encounter.  Meds ordered this encounter  Medications   ALPRAZolam (XANAX) 0.5 MG tablet    Sig: Take 0.5-1 tablets (0.25-0.5 mg total) by mouth daily as needed for anxiety.    Dispense:  30 tablet    Refill:  0      I reviewed the patients updated PMH, FH, and SocHx.    Patient Active Problem List   Diagnosis Date Noted   Obesity (BMI 30-39.9) 11/10/2012    Priority: High   Acquired hypothyroidism 01/09/2007    Priority: High   Mixed hyperlipidemia 01/09/2007    Priority: High   Essential hypertension 01/09/2007    Priority: High   Transient adjustment reaction with anxiety 05/05/2022    Priority: Medium    Irritable bowel syndrome with diarrhea 06/17/2015     Priority: Medium    Osteopenia 09/10/2014    Priority: Medium    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   amLODipine (NORVASC) 5 MG tablet Take 1 tablet (5 mg total) by mouth daily.   aspirin 81 MG tablet Take 81 mg by mouth daily.   benazepril (LOTENSIN) 40 MG tablet TAKE 1 TABLET BY MOUTH DAILY   Calcium Carb-Cholecalciferol (CALCIUM-VITAMIN D3) 600-500 MG-UNIT CAPS Take 1 capsule by mouth daily.   ELDERBERRY PO Take by mouth.   hydrochlorothiazide (MICROZIDE) 12.5 MG capsule Take 1 capsule (12.5 mg total) by mouth daily.   levothyroxine (SYNTHROID) 112 MCG tablet Take T,Th,Sat, and Sun   levothyroxine (SYNTHROID) 125 MCG tablet Take 1 tablet (125 mcg total) by mouth daily on Mondays, Wednesdays, and Fridays.   lovastatin (MEVACOR) 40 MG tablet TAKE 1 TABLET BY MOUTH AT  BEDTIME   Multiple Vitamin (MULTIVITAMIN) tablet Take 1 tablet by mouth daily.   Multiple Vitamins-Minerals (ZINC PO) Take 50 mg by mouth.  pyridoxine (B-6) 100 MG tablet Take 100 mg by mouth daily.   VITAMIN D, CHOLECALCIFEROL, PO Take by mouth. Vit d 3   zolpidem (AMBIEN) 10 MG tablet Take 1 tablet (10 mg total) by mouth at bedtime.   [DISCONTINUED] busPIRone (BUSPAR) 7.5 MG tablet Take 2 tablets (15 mg total) by mouth 2 (two) times daily as needed.   [DISCONTINUED] escitalopram (LEXAPRO) 10 MG tablet Take 1 tablet (10 mg total) by mouth daily.    Allergies: Patient is allergic to adhesive [tape]. Family history:  Patient family history includes Alcohol abuse in her mother; Alzheimer's disease in her sister; COPD in her father. Social History    Socioeconomic History   Marital status: Married    Spouse name: Not on file   Number of children: 3   Years of education: Not on file   Highest education level: Not on file  Occupational History    Employer: RETIRED    Comment: Retired  Tobacco Use   Smoking status: Never   Smokeless tobacco: Never  Substance and Sexual Activity   Alcohol use: No   Drug use: No   Sexual activity: Never  Other Topics Concern   Not on file  Social History Narrative   Not on file   Social Determinants of Health   Financial Resource Strain: Low Risk  (03/18/2022)   Overall Financial Resource Strain (CARDIA)    Difficulty of Paying Living Expenses: Not hard at all  Food Insecurity: No Food Insecurity (03/18/2022)   Hunger Vital Sign    Worried About Running Out of Food in the Last Year: Never true    Ran Out of Food in the Last Year: Never true  Transportation Needs: No Transportation Needs (03/18/2022)   PRAPARE - Administrator, Civil Service (Medical): No    Lack of Transportation (Non-Medical): No  Physical Activity: Sufficiently Active (03/18/2022)   Exercise Vital Sign    Days of Exercise per Week: 7 days    Minutes of Exercise per Session: 50 min  Stress: No Stress Concern Present (03/18/2022)   [employer] of Occupational Health - Occupational Stress Questionnaire    Feeling of Stress : Not at all  Social Connections: Moderately Integrated (03/18/2022)   Social Connection and Isolation Panel [NHANES]    Frequency of Communication with Friends and Family: More than three times a week    Frequency of Social Gatherings with Friends and Family: More than three times a week    Attends Religious Services: 1 to 4 times per year    Active Member of [employer] or Organizations: No    Attends Engineer, structural: Never    Marital Status: Married     Review of Systems: Constitutional: Negative for fever malaise or anorexia Cardiovascular: negative for chest  pain Respiratory: negative for SOB or persistent cough Gastrointestinal: negative for abdominal pain  Objective  Vitals: BP 100/62   Pulse 80   Temp 97.9 F (36.6 C)   Ht 5\' 3"  (1.6 m)   Wt 163 lb 3.2 oz (74 kg)   SpO2 94%   BMI 28.91 kg/m  General: no acute distress, well appearing, no apparent distress, well groomed Psych:  Alert and oriented x 3, flat affect, normal speech, appropriate .    Commons side effects, risks, benefits, and alternatives for medications and treatment plan prescribed today were discussed, and the patient expressed understanding of the given instructions. Patient is instructed to call or message via MyChart if he/she has  any questions or concerns regarding our treatment plan. No barriers to understanding were identified. We discussed Red Flag symptoms and signs in detail. Patient expressed understanding regarding what to do in case of urgent or emergency type symptoms.  Medication list was reconciled, printed and provided to the patient in AVS. Patient instructions and summary information was reviewed with the patient as documented in the AVS. This note was prepared with assistance of Dragon voice recognition software. Occasional wrong-word or sound-a-like substitutions may have occurred due to the inherent limitations of voice recognition software

## 2022-07-02 ENCOUNTER — Other Ambulatory Visit (HOSPITAL_COMMUNITY): Payer: Self-pay

## 2022-07-14 ENCOUNTER — Other Ambulatory Visit (HOSPITAL_COMMUNITY): Payer: Self-pay

## 2022-08-09 ENCOUNTER — Other Ambulatory Visit (HOSPITAL_COMMUNITY): Payer: Self-pay

## 2022-08-10 ENCOUNTER — Other Ambulatory Visit (HOSPITAL_COMMUNITY): Payer: Self-pay

## 2022-08-11 ENCOUNTER — Other Ambulatory Visit (HOSPITAL_COMMUNITY): Payer: Self-pay

## 2022-09-08 ENCOUNTER — Telehealth: Payer: Self-pay | Admitting: Family Medicine

## 2022-09-08 NOTE — Telephone Encounter (Signed)
Prescription Request  09/08/2022  LOV: 06/16/2022  What is the name of the medication or equipment? ALPRAZolam (XANAX) 0.5 MG tablet   Have you contacted your pharmacy to request a refill? Yes   Which pharmacy would you like this sent to?  [facility] [address] [place] [address] Phone: [phone] Fax: [phone]    Patient notified that their request is being sent to the clinical staff for review and that they should receive a response within 2 business days.   Please advise at Mobile [phone] (mobile)

## 2022-09-09 ENCOUNTER — Other Ambulatory Visit (HOSPITAL_COMMUNITY): Payer: Self-pay

## 2022-09-09 ENCOUNTER — Other Ambulatory Visit: Payer: Self-pay | Admitting: Family

## 2022-09-09 ENCOUNTER — Other Ambulatory Visit: Payer: Self-pay

## 2022-09-09 MED ORDER — ALPRAZOLAM 0.5 MG PO TABS
0.2500 mg | ORAL_TABLET | Freq: Every day | ORAL | 0 refills | Status: DC | PRN
  Filled 2022-09-09: qty 30, 30d supply, fill #0

## 2022-09-13 ENCOUNTER — Other Ambulatory Visit (HOSPITAL_COMMUNITY): Payer: Self-pay

## 2022-09-14 ENCOUNTER — Ambulatory Visit (INDEPENDENT_AMBULATORY_CARE_PROVIDER_SITE_OTHER): Payer: Medicare Other | Admitting: Family

## 2022-09-14 ENCOUNTER — Encounter: Payer: Self-pay | Admitting: Family

## 2022-09-14 VITALS — BP 107/72 | HR 64 | Temp 97.1°F | Ht 63.0 in | Wt 168.5 lb

## 2022-09-14 DIAGNOSIS — M25473 Effusion, unspecified ankle: Secondary | ICD-10-CM | POA: Diagnosis not present

## 2022-09-14 LAB — BASIC METABOLIC PANEL
BUN: 26 mg/dL — ABNORMAL HIGH (ref 6–23)
CO2: 26 mEq/L (ref 19–32)
Calcium: 9.8 mg/dL (ref 8.4–10.5)
Chloride: 107 mEq/L (ref 96–112)
Creatinine, Ser: 1.31 mg/dL — ABNORMAL HIGH (ref 0.40–1.20)
GFR: 39.01 mL/min — ABNORMAL LOW (ref >60.00)
Glucose, Bld: 112 mg/dL — ABNORMAL HIGH (ref 70–99)
Potassium: 3.9 mEq/L (ref 3.5–5.1)
Sodium: 143 mEq/L (ref 135–145)

## 2022-09-14 NOTE — Progress Notes (Signed)
Patient ID: Alexis Banks, female    DOB: 10/18/1945, 77 y.o.   MRN: [id number]  Chief Complaint  Patient presents with   Foot Swelling    sx for 2d    HPI:      Ankle swelling:   Pt c/o bilateral ankle swelling and burning for 2 days. Has tried elevating legs which does not help. Pt takes HCTZ 12.5mg  daily, Amlodipine 5mg , Benazepril 40mg . Reports her husband is in ICU with COPD & not expected to survive, she has been on her feet a lot more than usual,  pt tearful during visit.   Assessment & Plan:  1. Ankle swelling, unspecified laterality no recent lab work, will check BMP, if good, advised to increase HCTZ to 2 pills for next 3days, advised on importance of hydrating with 2L water, elevating feet as much as possible. Call back if sx are not improved or worsening.   - Basic Metabolic Panel (BMET)  Subjective:    Outpatient Medications Prior to Visit  Medication Sig Dispense Refill   ALPRAZolam (XANAX) 0.5 MG tablet Take 0.5-1 tablets (0.25-0.5 mg total) by mouth daily as needed for anxiety. 30 tablet 0   amLODipine (NORVASC) 5 MG tablet Take 1 tablet (5 mg total) by mouth daily. 90 tablet 3   aspirin 81 MG tablet Take 81 mg by mouth daily.     benazepril (LOTENSIN) 40 MG tablet TAKE 1 TABLET BY MOUTH DAILY 100 tablet 2   Calcium Carb-Cholecalciferol (CALCIUM-VITAMIN D3) 600-500 MG-UNIT CAPS Take 1 capsule by mouth daily.     ELDERBERRY PO Take by mouth.     hydrochlorothiazide (MICROZIDE) 12.5 MG capsule Take 1 capsule (12.5 mg total) by mouth daily. 90 capsule 3   levothyroxine (SYNTHROID) 112 MCG tablet Take T,Th,Sat, and Sun 90 tablet 3   levothyroxine (SYNTHROID) 125 MCG tablet Take 1 tablet (125 mcg total) by mouth daily on Mondays, Wednesdays, and Fridays. 90 tablet 3   lovastatin (MEVACOR) 40 MG tablet TAKE 1 TABLET BY MOUTH AT  BEDTIME 100 tablet 2   Multiple Vitamin (MULTIVITAMIN) tablet Take 1 tablet by mouth daily.     Multiple Vitamins-Minerals (ZINC PO)  Take 50 mg by mouth.     pyridoxine (B-6) 100 MG tablet Take 100 mg by mouth daily.     VITAMIN D, CHOLECALCIFEROL, PO Take by mouth. Vit d 3     zolpidem (AMBIEN) 10 MG tablet Take 1 tablet (10 mg total) by mouth at bedtime. 30 tablet 5   No facility-administered medications prior to visit.   Past Medical History:  Diagnosis Date   Anemia    ANXIETY DEPRESSION    Common bile duct dilation    CORONARY ARTERY DISEASE    GERD    Hyperlipidemia    Hypertension    HYPOTHYROIDISM    INSOMNIA    ROTATOR CUFF SYNDROME, RIGHT    Trochanteric bursitis of left hip    VARICOSE VEINS, LOWER EXTREMITIES    VITAMIN D DEFICIENCY    Past Surgical History:  Procedure Laterality Date   CHOLECYSTECTOMY     Rotator cuff surgery     Allergies  Allergen Reactions   Adhesive [Tape] Rash    Rubbery tape - blisters      Objective:    Physical Exam Vitals reviewed.  Constitutional:      Appearance: Normal appearance. She is obese.  Cardiovascular:     Rate and Rhythm: Normal rate and regular rhythm.  Pulmonary:  Effort: Pulmonary effort is normal.     Breath sounds: Normal breath sounds.  Musculoskeletal:        General: Normal range of motion.     Right lower leg: 3+ Edema present.     Left lower leg: 3+ Edema present.  Skin:    General: Skin is warm and dry.  Neurological:     Mental Status: She is alert.  Psychiatric:        Mood and Affect: Mood normal.        Behavior: Behavior normal.    BP 107/72 (BP Location: Left Arm, Patient Position: Sitting, Cuff Size: Normal)   Pulse 64   Temp (!) 97.1 F (36.2 C) (Temporal)   Ht 5\' 3"  (1.6 m)   Wt 168 lb 8 oz (76.4 kg)   SpO2 93%   BMI 29.85 kg/m  Wt Readings from Last 3 Encounters:  09/14/22 168 lb 8 oz (76.4 kg)  06/16/22 163 lb 3.2 oz (74 kg)  05/05/22 168 lb 9.6 oz (76.5 kg)      Dulce Sellar, NP

## 2022-09-21 ENCOUNTER — Other Ambulatory Visit (HOSPITAL_COMMUNITY): Payer: Self-pay

## 2022-09-21 ENCOUNTER — Telehealth: Payer: Self-pay | Admitting: Family Medicine

## 2022-09-21 NOTE — Telephone Encounter (Signed)
Patient called stating she had taken two tablets of her HCTZ for 3 days as advised for her swelling by Dulce Sellar on 8/27 but it hasn't helped. States she has also been trying to elevate her legs as much as possible but still no improvements.

## 2022-09-22 NOTE — Telephone Encounter (Signed)
Next same day opening with pcp is 9/16 and next regular opening  with pcp is 9/24. Is it okay for patient to wait until then or should she f.u with provider she previously saw? Please advise.

## 2022-09-23 ENCOUNTER — Encounter: Payer: Self-pay | Admitting: Family Medicine

## 2022-09-23 NOTE — Telephone Encounter (Signed)
Patient called back and was transferred to [facility] who gave her advice per Dr. Mardelle Matte on how to better handle symptoms.

## 2022-09-24 NOTE — Telephone Encounter (Signed)
Spoke with pt and she stated that she is doing much better and the swelling has gone down and no trouble with breathing. I advised pt that if her symptoms still persist she will need to make an appt to be reevaluated.

## 2022-09-27 ENCOUNTER — Other Ambulatory Visit (HOSPITAL_COMMUNITY): Payer: Self-pay

## 2022-10-18 ENCOUNTER — Other Ambulatory Visit: Payer: Self-pay

## 2022-10-18 ENCOUNTER — Other Ambulatory Visit: Payer: Self-pay | Admitting: Family

## 2022-10-18 ENCOUNTER — Other Ambulatory Visit: Payer: Self-pay | Admitting: Family Medicine

## 2022-10-18 ENCOUNTER — Other Ambulatory Visit (HOSPITAL_COMMUNITY): Payer: Self-pay

## 2022-10-18 MED ORDER — HYDROCHLOROTHIAZIDE 12.5 MG PO CAPS
12.5000 mg | ORAL_CAPSULE | Freq: Every day | ORAL | 3 refills | Status: DC
  Filled 2022-10-18: qty 90, 90d supply, fill #0

## 2022-10-20 ENCOUNTER — Other Ambulatory Visit (HOSPITAL_COMMUNITY): Payer: Self-pay

## 2022-10-20 MED ORDER — INFLUENZA VAC A&B SURF ANT ADJ 0.5 ML IM SUSY
0.5000 mL | PREFILLED_SYRINGE | Freq: Once | INTRAMUSCULAR | 0 refills | Status: AC
  Filled 2022-10-20: qty 0.5, 1d supply, fill #0

## 2022-11-08 ENCOUNTER — Other Ambulatory Visit (HOSPITAL_COMMUNITY): Payer: Self-pay

## 2022-11-08 ENCOUNTER — Other Ambulatory Visit: Payer: Self-pay | Admitting: Family Medicine

## 2022-11-10 ENCOUNTER — Other Ambulatory Visit (HOSPITAL_COMMUNITY): Payer: Self-pay

## 2022-11-10 MED ORDER — LOVASTATIN 40 MG PO TABS
40.0000 mg | ORAL_TABLET | Freq: Every day | ORAL | 2 refills | Status: DC
  Filled 2022-11-10: qty 100, 100d supply, fill #0

## 2022-11-11 ENCOUNTER — Other Ambulatory Visit (HOSPITAL_COMMUNITY): Payer: Self-pay

## 2022-11-25 ENCOUNTER — Other Ambulatory Visit: Payer: Self-pay | Admitting: Family Medicine

## 2022-11-25 ENCOUNTER — Other Ambulatory Visit (HOSPITAL_COMMUNITY): Payer: Self-pay

## 2022-11-25 MED ORDER — ZOLPIDEM TARTRATE 10 MG PO TABS
10.0000 mg | ORAL_TABLET | Freq: Every day | ORAL | 5 refills | Status: DC
Start: 2022-11-25 — End: 2023-02-12
  Filled 2022-11-25 (×2): qty 30, 30d supply, fill #0
  Filled 2022-12-21 (×2): qty 30, 30d supply, fill #1
  Filled 2023-01-22: qty 30, 30d supply, fill #2

## 2022-11-26 ENCOUNTER — Other Ambulatory Visit (HOSPITAL_COMMUNITY): Payer: Self-pay

## 2022-11-29 ENCOUNTER — Other Ambulatory Visit: Payer: Self-pay | Admitting: Family Medicine

## 2022-11-29 DIAGNOSIS — Z0000 Encounter for general adult medical examination without abnormal findings: Secondary | ICD-10-CM

## 2022-12-09 ENCOUNTER — Other Ambulatory Visit (HOSPITAL_COMMUNITY): Payer: Self-pay

## 2022-12-09 ENCOUNTER — Ambulatory Visit: Payer: Medicare Other | Admitting: Family Medicine

## 2022-12-09 ENCOUNTER — Encounter: Payer: Self-pay | Admitting: Family Medicine

## 2022-12-09 VITALS — BP 120/76 | HR 71 | Temp 97.6°F | Ht 63.0 in | Wt 163.2 lb

## 2022-12-09 DIAGNOSIS — Z780 Asymptomatic menopausal state: Secondary | ICD-10-CM

## 2022-12-09 DIAGNOSIS — E039 Hypothyroidism, unspecified: Secondary | ICD-10-CM

## 2022-12-09 DIAGNOSIS — Z0001 Encounter for general adult medical examination with abnormal findings: Secondary | ICD-10-CM

## 2022-12-09 DIAGNOSIS — F4322 Adjustment disorder with anxiety: Secondary | ICD-10-CM | POA: Diagnosis not present

## 2022-12-09 DIAGNOSIS — E782 Mixed hyperlipidemia: Secondary | ICD-10-CM

## 2022-12-09 DIAGNOSIS — F5101 Primary insomnia: Secondary | ICD-10-CM

## 2022-12-09 DIAGNOSIS — M8580 Other specified disorders of bone density and structure, unspecified site: Secondary | ICD-10-CM

## 2022-12-09 DIAGNOSIS — I10 Essential (primary) hypertension: Secondary | ICD-10-CM

## 2022-12-09 LAB — COMPREHENSIVE METABOLIC PANEL
ALT: 14 U/L (ref 0–35)
AST: 20 U/L (ref 0–37)
Albumin: 4.3 g/dL (ref 3.5–5.2)
Alkaline Phosphatase: 86 U/L (ref 39–117)
BUN: 36 mg/dL — ABNORMAL HIGH (ref 6–23)
CO2: 26 meq/L (ref 19–32)
Calcium: 9.9 mg/dL (ref 8.4–10.5)
Chloride: 104 meq/L (ref 96–112)
Creatinine, Ser: 1.32 mg/dL — ABNORMAL HIGH (ref 0.40–1.20)
GFR: 38.59 mL/min — ABNORMAL LOW (ref >60.00)
Glucose, Bld: 108 mg/dL — ABNORMAL HIGH (ref 70–99)
Potassium: 4.6 meq/L (ref 3.5–5.1)
Sodium: 138 meq/L (ref 135–145)
Total Bilirubin: 0.8 mg/dL (ref 0.2–1.2)
Total Protein: 7.5 g/dL (ref 6.0–8.3)

## 2022-12-09 LAB — CBC WITH DIFFERENTIAL/PLATELET
Basophils Absolute: 0 10*3/uL (ref 0.0–0.1)
Basophils Relative: 0.7 % (ref 0.0–3.0)
Eosinophils Absolute: 0.2 10*3/uL (ref 0.0–0.7)
Eosinophils Relative: 3.8 % (ref 0.0–5.0)
HCT: 41.7 % (ref 36.0–46.0)
Hemoglobin: 13.5 g/dL (ref 12.0–15.0)
Lymphocytes Relative: 48.9 % — ABNORMAL HIGH (ref 12.0–46.0)
Lymphs Abs: 2.7 10*3/uL (ref 0.7–4.0)
MCHC: 32.4 g/dL (ref 30.0–36.0)
MCV: 92.3 fL (ref 78.0–100.0)
Monocytes Absolute: 0.5 10*3/uL (ref 0.1–1.0)
Monocytes Relative: 9.3 % (ref 3.0–12.0)
Neutro Abs: 2.1 10*3/uL (ref 1.4–7.7)
Neutrophils Relative %: 37.3 % — ABNORMAL LOW (ref 43.0–77.0)
Platelets: 262 10*3/uL (ref 150.0–400.0)
RBC: 4.52 Mil/uL (ref 3.87–5.11)
RDW: 14 % (ref 11.5–15.5)
WBC: 5.5 10*3/uL (ref 4.0–10.5)

## 2022-12-09 LAB — TSH: TSH: 0.36 u[IU]/mL (ref 0.35–5.50)

## 2022-12-09 LAB — LIPID PANEL
Cholesterol: 139 mg/dL (ref 0–200)
HDL: 44.6 mg/dL (ref >39.00)
LDL Cholesterol: 68 mg/dL (ref 0–99)
NonHDL: 94.55
Total CHOL/HDL Ratio: 3
Triglycerides: 132 mg/dL (ref 0.0–149.0)
VLDL: 26.4 mg/dL (ref 0.0–40.0)

## 2022-12-09 MED ORDER — ALPRAZOLAM 0.5 MG PO TABS
0.2500 mg | ORAL_TABLET | Freq: Every day | ORAL | 1 refills | Status: DC | PRN
  Filled 2022-12-09: qty 30, 30d supply, fill #0
  Filled 2023-01-22: qty 30, 30d supply, fill #1

## 2022-12-09 NOTE — Progress Notes (Signed)
Please call patient: kidney function is about the same as it was in August.  Please have her stop the hydrochlorothiazide and decrease the amlodipine to 2.5mg  daily. I will recheck levels in 3 months. Avoid nsaids.  Thyroid and cholesterol levels look good. No other changes are needed at this time.

## 2022-12-09 NOTE — Patient Instructions (Addendum)
Please return in 3 months for recheck.   I will release your lab results to you on your MyChart account with further instructions. You may see the results before I do, but when I review them I will send you a message with my report or have my assistant call you if things need to be discussed. Please reply to my message with any questions. Thank you!   If you have any questions or concerns, please don't hesitate to send me a message via MyChart or call the office at [phone]. Thank you for visiting with Alexis Banks today! It's our pleasure caring for you.    VISIT SUMMARY:  During today's visit, we discussed the challenges you are facing while caring for your spouse, who has been dealing with significant health issues. We addressed your concerns about swelling in your feet, anxiety, and the stress associated with caregiving. We also reviewed your general health maintenance and vaccination status.  YOUR PLAN:  -HTN and edema are now well controlled. I have not changed your medications and will recheck your blood work.   -ANXIETY: Anxiety is a feeling of worry or fear that can be mild or severe. You have been using alprazolam sparingly to manage your anxiety and requested a refill, which we have provided. It's important to continue managing your anxiety, especially given the stress of caregiving.  -CAREGIVER STRESS: Caregiver stress is the emotional and physical strain of caring for someone with significant health issues. We discussed the potential benefits of palliative care support for your spouse and emphasized the importance of self-care and seeking support for yourself. Please consider these options to help manage your stress.  -GENERAL HEALTH MAINTENANCE: You are up to date on your flu and pneumonia vaccinations. No additional vaccinations are needed at this time.  INSTRUCTIONS:  Please follow up in three months and review your blood work results after the doctor's return from vacation.

## 2022-12-09 NOTE — Progress Notes (Signed)
Subjective  Chief Complaint  Patient presents with   Annual Exam    Pt here for Annual Exam and is currently fasting. Mammo is scheduled for 12/5   Hypertension    HPI: Alexis Banks is a 77 y.o. female who presents to [facility] [place] today for a Female Wellness Visit. She also has the concerns and/or needs as listed above in the chief complaint. These will be addressed in addition to the Health Maintenance Visit.   Wellness Visit: annual visit with health maintenance review and exam  HM: 77 year old physically doing well but dealing with husband who has terminal illness, end-stage COPD and history of cancer.  See below. Mammogram scheduled for December.  She is overdue for a bone density to follow-up osteopenia but may defer this due to her current living situation and stressors being the primary caretaker of her husband. Immunizations are current.  Chronic disease f/u and/or acute problem visit: (deemed necessary to be done in addition to the wellness visit): Hypertension is now well-controlled on olmesartan, amlodipine and hydrochlorothiazide.  Her peripheral edema has improved.  Reviewed most recent BMP from August showing mild worsening of her chronic kidney disease.  She denies nausea or side effects from her medications.  She is compliant.  No chest pain or shortness of breath. Hyperlipidemia on a statin now fasting for recheck.  This has been well-controlled.  She does tolerate a statin. Hypothyroidism due for recheck: She alternates dosing between 112 and 125 mcg daily.  She feels physically well. Osteopenia: Mild.  Due for recheck. Adjustment reaction and anxiety: The patient's spouse, who has a history of cancer and recent bowel obstruction surgery, has been experiencing significant health challenges. Post-surgery, the patient's spouse has been largely homebound, able to walk only short distances before needing to rest. The patient reports that her  spouse's attitude has improved, but she struggles with guilt over past smoking habits. The patient's spouse is also experiencing difficulty eating and swallowing, with the patient suspecting a recurrence of nasal cancer.  She is sparingly using Xanax.  Overall doing extremely well handling the stress.  No indication for other medications at this time.  Close follow-up recommended.  I did discuss palliative care and hospice consult for husband when they feel it is appropriate.   Assessment  1. Encounter for well adult exam with abnormal findings   2. Mixed hyperlipidemia   3. Essential hypertension   4. Acquired hypothyroidism   5. Transient adjustment reaction with anxiety   6. Primary insomnia   7. Osteopenia, unspecified location   8. Asymptomatic menopausal state      Plan  Female Wellness Visit: Age appropriate Health Maintenance and Prevention measures were discussed with patient. Included topics are cancer screening recommendations, ways to keep healthy (see AVS) including dietary and exercise recommendations, regular eye and dental care, use of seat belts, and avoidance of moderate alcohol use and tobacco use.  BMI: discussed patient's BMI and encouraged positive lifestyle modifications to help get to or maintain a target BMI. HM needs and immunizations were addressed and ordered. See below for orders. See HM and immunization section for updates. Routine labs and screening tests ordered including cmp, cbc and lipids where appropriate. Discussed recommendations regarding Vit D and calcium supplementation (see AVS)  Chronic disease management visit and/or acute problem visit: Hypertension is now well-controlled on olmesartan, amlodipine and hydrochlorothiazide.  Her peripheral edema has improved.  Reviewed most recent BMP from August showing mild worsening  of her chronic kidney disease.  She denies nausea or side effects from her medications.  She is compliant.  No chest pain or shortness  of breath. Hyperlipidemia on a statin now fasting for recheck.  This has been well-controlled.  She does tolerate a statin. Hypothyroidism due for recheck: She alternates dosing between 112 and 125 mcg daily.  She feels physically well. Osteopenia: Mild.  Due for recheck. Adjustment reaction and anxiety: The patient's spouse, who has a history of cancer and recent bowel obstruction surgery, has been experiencing significant health challenges. Post-surgery, the patient's spouse has been largely homebound, able to walk only short distances before needing to rest. The patient reports that her spouse's attitude has improved, but she struggles with guilt over past smoking habits. The patient's spouse is also experiencing difficulty eating and swallowing, with the patient suspecting a recurrence of nasal cancer.  She is sparingly using Xanax.  Overall doing extremely well handling the stress.  No indication for other medications at this time.  Close follow-up recommended.  I did discuss palliative care and hospice consult for husband when they feel it is appropriate - Encourage self-care and stress management strategies Follow up: 3 months for recheck   Orders Placed This Encounter  Procedures   DG Bone Density   CBC with Differential/Platelet   Comprehensive metabolic panel   Lipid panel   TSH   Meds ordered this encounter  Medications   ALPRAZolam (XANAX) 0.5 MG tablet    Sig: Take 0.5-1 tablets (0.25-0.5 mg total) by mouth daily as needed for anxiety.    Dispense:  30 tablet    Refill:  1      Body mass index is 28.91 kg/m. Wt Readings from Last 3 Encounters:  12/09/22 163 lb 3.2 oz (74 kg)  09/14/22 168 lb 8 oz (76.4 kg)  06/16/22 163 lb 3.2 oz (74 kg)     Patient Active Problem List   Diagnosis Date Noted Date Diagnosed   Obesity (BMI 30-39.9) 11/10/2012     Priority: High   Acquired hypothyroidism 01/09/2007     Priority: High   Mixed hyperlipidemia 01/09/2007     Priority:  High   Essential hypertension 01/09/2007     Priority: High   Transient adjustment reaction with anxiety 05/05/2022     Priority: Medium     Struggling with husband's illness and family dynamics.  Trial of BuSpar, Lexapro 10 started April 2024, failed high-dose BuSpar 15 3 times daily and Lexapro.  Changed to as needed low-dose Xanax.  Recommended counseling, Dr. Sallyanne Kuster.    Irritable bowel syndrome with diarrhea 06/17/2015     Priority: Medium    Osteopenia 09/10/2014     Priority: Medium     dexa 08/2019: osteopenia, T = -2.3, stable, recheck 2 years.     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     Priority: Low   Uses hearing aid 12/22/2015     Priority: Low   Health Maintenance  Topic Date Due   DEXA SCAN  08/23/2021   MAMMOGRAM  10/29/2022   Medicare Annual Wellness (AWV)  03/18/2023   DTaP/Tdap/Td (3 - Td or Tdap) 06/18/2025   Pneumonia Vaccine 28+ Years old  Completed   INFLUENZA VACCINE  Completed  Hepatitis C Screening  Completed   Zoster Vaccines- Shingrix  Completed   HPV VACCINES  Aged Out   Colonoscopy  Discontinued   COVID-19 Vaccine  Discontinued   Immunization History  Administered Date(s) Administered   Fluad Quad(high Dose 65+) 10/15/2015, 10/26/2016, 10/19/2019, 10/14/2020, 10/06/2021   Fluad Trivalent(High Dose 65+) 10/20/2022   Influenza, High Dose Seasonal PF 10/07/2012, 10/15/2014, 10/15/2015, 10/26/2016, 10/13/2017   Influenza,inj,Quad PF,6+ Mos 10/25/2013   Influenza-Unspecified 10/14/2018   PFIZER(Purple Top)SARS-COV-2 Vaccination 02/07/2019, 02/28/2019   Pneumococcal Conjugate-13 11/19/2014   Pneumococcal Polysaccharide-23 08/09/2011, 10/28/2011   Tdap 07/30/2008, 06/19/2015   Zoster Recombinant(Shingrix) 05/08/2021, 08/20/2021   Zoster, Live 09/09/2011   We updated  and reviewed the patient's past history in detail and it is documented below. Allergies: Patient is allergic to adhesive [tape]. Past Medical History Patient  has a past medical history of Anemia, ANXIETY DEPRESSION, Common bile duct dilation, CORONARY ARTERY DISEASE, GERD, Hyperlipidemia, Hypertension, HYPOTHYROIDISM, INSOMNIA, ROTATOR CUFF SYNDROME, RIGHT, Trochanteric bursitis of left hip, VARICOSE VEINS, LOWER EXTREMITIES, and VITAMIN D DEFICIENCY. Past Surgical History Patient  has a past surgical history that includes Cholecystectomy and Rotator cuff surgery. Family History: Patient family history includes Alcohol abuse in her mother; Alzheimer's disease in her sister; COPD in her father. Social History:  Patient  reports that she has never smoked. She has never used smokeless tobacco. She reports that she does not drink alcohol and does not use drugs.  Review of Systems: Constitutional: negative for fever or malaise Ophthalmic: negative for photophobia, double vision or loss of vision Cardiovascular: negative for chest pain, dyspnea on exertion, or new LE swelling Respiratory: negative for SOB or persistent cough Gastrointestinal: negative for abdominal pain, change in bowel habits or melena Genitourinary: negative for dysuria or gross hematuria, no abnormal uterine bleeding or disharge Musculoskeletal: negative for new gait disturbance or muscular weakness Integumentary: negative for new or persistent rashes, no breast lumps Neurological: negative for TIA or stroke symptoms Psychiatric: negative for SI or delusions Allergic/Immunologic: negative for hives  Patient Care Team    Relationship Specialty Notifications Start End  Willow Ora, MD PCP - General Family Medicine  12/23/16   Hart Carwin, MD (Inactive) Consulting Physician Gastroenterology  07/30/13   Lewayne Bunting, MD Consulting Physician Cardiology  07/30/13   Lenn Sink, DPM Consulting Physician Podiatry   05/26/17   Ranee Gosselin, MD Consulting Physician Orthopedic Surgery  05/26/17   Sinda Du, MD Consulting Physician Ophthalmology  10/13/18   [facility] Consulting Physician Audiology  10/13/18   Dahlia Byes, [facility] (Inactive) Pharmacist Pharmacist  05/01/20    Comment: Phone [phone]    Objective  Vitals: BP 120/76   Pulse 71   Temp 97.6 F (36.4 C)   Ht 5\' 3"  (1.6 m)   Wt 163 lb 3.2 oz (74 kg)   SpO2 97%   BMI 28.91 kg/m  General:  Well developed, well nourished, no acute distress  Psych:  Alert and orientedx3,normal mood and affect, she appears well today.  Affect is normal.  Insight is good HEENT:  Normocephalic, atraumatic, non-icteric sclera,  supple neck without adenopathy, mass or thyromegaly Cardiovascular:  Normal S1, S2, RRR without gallop, rub or murmur, no peripheral edema Respiratory:  Good breath sounds bilaterally, CTAB with normal respiratory effort Gastrointestinal: normal bowel sounds, soft, non-tender, no noted masses. No HSM MSK: extremities without edema, joints without erythema or swelling Neurologic:    Mental status is normal.  Gross motor and sensory  exams are normal.  No tremor  Commons side effects, risks, benefits, and alternatives for medications and treatment plan prescribed today were discussed, and the patient expressed understanding of the given instructions. Patient is instructed to call or message via MyChart if he/she has any questions or concerns regarding our treatment plan. No barriers to understanding were identified. We discussed Red Flag symptoms and signs in detail. Patient expressed understanding regarding what to do in case of urgent or emergency type symptoms.  Medication list was reconciled, printed and provided to the patient in AVS. Patient instructions and summary information was reviewed with the patient as documented in the AVS. This note was prepared with assistance of Dragon voice recognition software. Occasional  wrong-word or sound-a-like substitutions may have occurred due to the inherent limitations of voice recognition software

## 2022-12-10 ENCOUNTER — Telehealth: Payer: Self-pay | Admitting: Family Medicine

## 2022-12-10 NOTE — Telephone Encounter (Signed)
Patient called requesting message be read to her since she wasn't able to get into Mychart. I read patient the message verbatim and she verbalized understanding.

## 2022-12-10 NOTE — Telephone Encounter (Signed)
Patient requests to be called to be advised if a new RX for Amlodipine 2.5 MG is going to be sent to Pharmacy per message Patient received on voice mail.

## 2022-12-13 ENCOUNTER — Other Ambulatory Visit: Payer: Self-pay

## 2022-12-13 ENCOUNTER — Other Ambulatory Visit (HOSPITAL_COMMUNITY): Payer: Self-pay

## 2022-12-13 MED ORDER — AMLODIPINE BESYLATE 2.5 MG PO TABS
2.5000 mg | ORAL_TABLET | Freq: Every day | ORAL | 3 refills | Status: DC
  Filled 2022-12-13: qty 90, 90d supply, fill #0

## 2022-12-13 NOTE — Telephone Encounter (Signed)
Patient requests to be called (not sent a MyChart message) re: previous messages (including message from Dr. Mardelle Matte)

## 2022-12-14 ENCOUNTER — Other Ambulatory Visit (HOSPITAL_COMMUNITY): Payer: Self-pay

## 2022-12-15 ENCOUNTER — Other Ambulatory Visit (HOSPITAL_COMMUNITY): Payer: Self-pay

## 2022-12-21 ENCOUNTER — Other Ambulatory Visit (HOSPITAL_COMMUNITY): Payer: Self-pay

## 2022-12-22 DIAGNOSIS — H25813 Combined forms of age-related cataract, bilateral: Secondary | ICD-10-CM | POA: Diagnosis not present

## 2022-12-23 ENCOUNTER — Other Ambulatory Visit (HOSPITAL_COMMUNITY): Payer: Self-pay

## 2022-12-23 ENCOUNTER — Ambulatory Visit: Payer: Medicare Other

## 2023-01-22 ENCOUNTER — Other Ambulatory Visit (HOSPITAL_COMMUNITY): Payer: Self-pay

## 2023-01-24 ENCOUNTER — Other Ambulatory Visit: Payer: Self-pay

## 2023-01-24 ENCOUNTER — Other Ambulatory Visit (HOSPITAL_COMMUNITY): Payer: Self-pay

## 2023-01-25 ENCOUNTER — Other Ambulatory Visit (HOSPITAL_COMMUNITY): Payer: Self-pay

## 2023-02-08 ENCOUNTER — Telehealth: Payer: Self-pay | Admitting: *Deleted

## 2023-02-08 NOTE — Telephone Encounter (Signed)
Copied from CRM [id number]. Topic: General - Deceased Patient >> 03/10/23  4:26 PM Florestine Avers wrote: Name of caller: Lupita Leash  Date of death: 03/05/23  Name of funeral home: [facility]  Phone number of funeral home: [phone]  Provider that needs to sign form: Dr. Asencion Partridge  Timeline for signing: Urgent, funeral home states it needs to be signed asap.

## 2023-02-19 DEATH — deceased

## 2023-03-11 ENCOUNTER — Ambulatory Visit: Payer: Medicare Other | Admitting: Family Medicine
